# Patient Record
Sex: Female | Born: 1955 | Race: White | Marital: Married | Smoking: Never smoker
Health system: Southern US, Community
[De-identification: ages and names within clinical notes are randomized; demographics above are authoritative.]

## PROBLEM LIST (undated history)

## (undated) DIAGNOSIS — F32A Depression, unspecified: Secondary | ICD-10-CM

## (undated) DIAGNOSIS — M5136 Other intervertebral disc degeneration, lumbar region: Secondary | ICD-10-CM

## (undated) DIAGNOSIS — E119 Type 2 diabetes mellitus without complications: Secondary | ICD-10-CM

## (undated) DIAGNOSIS — M51369 Other intervertebral disc degeneration, lumbar region without mention of lumbar back pain or lower extremity pain: Secondary | ICD-10-CM

## (undated) DIAGNOSIS — K8020 Calculus of gallbladder without cholecystitis without obstruction: Secondary | ICD-10-CM

## (undated) DIAGNOSIS — I10 Essential (primary) hypertension: Secondary | ICD-10-CM

## (undated) DIAGNOSIS — I639 Cerebral infarction, unspecified: Secondary | ICD-10-CM

## (undated) DIAGNOSIS — F419 Anxiety disorder, unspecified: Secondary | ICD-10-CM

---

## 2003-10-24 ENCOUNTER — Ambulatory Visit: Payer: Self-pay

## 2016-08-27 ENCOUNTER — Emergency Department: Payer: BLUE CROSS/BLUE SHIELD

## 2016-08-27 ENCOUNTER — Emergency Department
Admission: EM | Admit: 2016-08-27 | Discharge: 2016-08-27 | Disposition: A | Discharge status: Home / Self Care | Payer: BLUE CROSS/BLUE SHIELD | Attending: Emergency Medicine | Admitting: Emergency Medicine

## 2016-08-27 ENCOUNTER — Encounter: Payer: Self-pay | Admitting: Emergency Medicine

## 2016-08-27 DIAGNOSIS — R112 Nausea with vomiting, unspecified: Secondary | ICD-10-CM | POA: Diagnosis not present

## 2016-08-27 DIAGNOSIS — N390 Urinary tract infection, site not specified: Secondary | ICD-10-CM | POA: Diagnosis not present

## 2016-08-27 DIAGNOSIS — E119 Type 2 diabetes mellitus without complications: Secondary | ICD-10-CM | POA: Insufficient documentation

## 2016-08-27 DIAGNOSIS — M5431 Sciatica, right side: Secondary | ICD-10-CM | POA: Insufficient documentation

## 2016-08-27 DIAGNOSIS — R110 Nausea: Secondary | ICD-10-CM | POA: Diagnosis present

## 2016-08-27 DIAGNOSIS — R630 Anorexia: Secondary | ICD-10-CM | POA: Insufficient documentation

## 2016-08-27 DIAGNOSIS — M5432 Sciatica, left side: Secondary | ICD-10-CM | POA: Diagnosis not present

## 2016-08-27 HISTORY — DX: Type 2 diabetes mellitus without complications: E11.9

## 2016-08-27 LAB — URINALYSIS, COMPLETE (UACMP) WITH MICROSCOPIC
BILIRUBIN URINE: NEGATIVE
Bacteria, UA: NONE SEEN
Glucose, UA: NEGATIVE mg/dL
Hgb urine dipstick: NEGATIVE
KETONES UR: 20 mg/dL — AB
NITRITE: NEGATIVE
PROTEIN: NEGATIVE mg/dL
Specific Gravity, Urine: 1.009 (ref 1.005–1.030)
pH: 6 (ref 5.0–8.0)

## 2016-08-27 LAB — COMPREHENSIVE METABOLIC PANEL
ALBUMIN: 4.5 g/dL (ref 3.5–5.0)
ALT: 10 U/L — ABNORMAL LOW (ref 14–54)
ANION GAP: 14 (ref 5–15)
AST: 13 U/L — ABNORMAL LOW (ref 15–41)
Alkaline Phosphatase: 50 U/L (ref 38–126)
BILIRUBIN TOTAL: 1.2 mg/dL (ref 0.3–1.2)
BUN: 13 mg/dL (ref 6–20)
CALCIUM: 10.6 mg/dL — AB (ref 8.9–10.3)
CO2: 24 mmol/L (ref 22–32)
Chloride: 98 mmol/L — ABNORMAL LOW (ref 101–111)
Creatinine, Ser: 0.54 mg/dL (ref 0.44–1.00)
GFR calc Af Amer: >60 mL/min (ref >60)
GLUCOSE: 165 mg/dL — AB (ref 65–99)
Potassium: 3.5 mmol/L (ref 3.5–5.1)
Sodium: 136 mmol/L (ref 135–145)
TOTAL PROTEIN: 7.3 g/dL (ref 6.5–8.1)

## 2016-08-27 LAB — LIPASE, BLOOD: Lipase: 29 U/L (ref 11–51)

## 2016-08-27 LAB — CBC
HCT: 41.5 % (ref 35.0–47.0)
HEMOGLOBIN: 14.3 g/dL (ref 12.0–16.0)
MCH: 31.2 pg (ref 26.0–34.0)
MCHC: 34.3 g/dL (ref 32.0–36.0)
MCV: 90.9 fL (ref 80.0–100.0)
Platelets: 374 10*3/uL (ref 150–440)
RBC: 4.57 MIL/uL (ref 3.80–5.20)
RDW: 15 % — AB (ref 11.5–14.5)
WBC: 10.3 10*3/uL (ref 3.6–11.0)

## 2016-08-27 LAB — GLUCOSE, CAPILLARY: GLUCOSE-CAPILLARY: 131 mg/dL — AB (ref 65–99)

## 2016-08-27 MED ORDER — IOPAMIDOL (ISOVUE-300) INJECTION 61%
15.0000 mL | INTRAVENOUS | Status: AC
  Administered 2016-08-27: 15 mL via ORAL

## 2016-08-27 MED ORDER — IOPAMIDOL (ISOVUE-300) INJECTION 61%
75.0000 mL | Freq: Once | INTRAVENOUS | Status: AC | PRN
  Administered 2016-08-27: 75 mL via INTRAVENOUS

## 2016-08-27 MED ORDER — CEPHALEXIN 500 MG PO CAPS: 500.0000 mg | ORAL_CAPSULE | Freq: Three times a day (TID) | ORAL | 0 refills | Status: AC

## 2016-08-27 MED ORDER — SODIUM CHLORIDE 0.9 % IV BOLUS (SEPSIS)
1000.0000 mL | Freq: Once | INTRAVENOUS | Status: AC
  Administered 2016-08-27: 1000 mL via INTRAVENOUS

## 2016-08-27 MED ORDER — ONDANSETRON HCL 4 MG PO TABS: 4.0000 mg | ORAL_TABLET | Freq: Every day | ORAL | 0 refills | Status: DC | PRN

## 2016-08-27 NOTE — ED Provider Notes (Signed)
[facility] Emergency Department Provider Note  ____________________________________________   I have reviewed the triage vital signs and the nursing notes.   HISTORY  Chief Complaint Nausea    HPI Lacey Gonzalez is a 61 y.o. female Who presents with multiple complaints. Mostly, she complains of nausea. Patient states she's been nauseated for for 5 months. She is losing weight. She does not have any clear reason. Her endocrinologist is no help. She is signed to go to a primary care doctor for this but she has not yet done so. She denies any fevers or chills. She states she vomits every today, nearlyand has been for months. She denies any hematemesis bright red blood per rectum or constipation. She has normal bowel movements. She denies abdominal pain. She has chronic bilateral lower extremity pain for which she takes gabapentin is poorly controlled. He has been there for months. She states thatshe has no headaches or focal numbness or weakness although she states her right leg hurts when she walks and she does not like to move it. She walks with a cane for this reason and has for the last 3 or 4 months. There are no acute problems today however, she happened to be in the hospital because someone in her family was here for another reason, she thought she would check in.   Past Medical History:  Diagnosis Date  . Diabetes mellitus without complication (HCC)     There are no active problems to display for this patient.   History reviewed. No pertinent surgical history.  Prior to Admission medications   Not on File    Allergies Patient has no known allergies.  History reviewed. No pertinent family history.  Social History Social History  Substance Use Topics  . Smoking status: Never Smoker  . Smokeless tobacco: Never Used  . Alcohol use Yes    Review of Systems Constitutional: No fever/chills Eyes: No visual changes. ENT: No sore throat. No stiff  neck no neck pain Cardiovascular: Denies chest pain. Respiratory: Denies shortness of breath. Gastrointestinal:   Positive reportedvomiting.  No diarrhea.  No constipation. Genitourinary: Negative for dysuria. Musculoskeletal: Negative lower extremity swelling Skin: Negative for rash. Neurological: Negative for severe headaches, focal weakness or numbness.   ____________________________________________   PHYSICAL EXAM:  VITAL SIGNS: ED Triage Vitals  Enc Vitals Group     BP 08/27/16 1453 (!) 162/67     Pulse Rate 08/27/16 1453 98     Resp 08/27/16 1453 18     Temp 08/27/16 1453 98 F (36.7 C)     Temp Source 08/27/16 1453 Oral     SpO2 08/27/16 1453 100 %     Weight 08/27/16 1454 113 lb (51.3 kg)     Height 08/27/16 1454 5\' 3"  (1.6 m)     Head Circumference --      Peak Flow --      Pain Score --      Pain Loc --      Pain Edu? --      Excl. in GC? --     Constitutional: Alert and oriented. Well appearing and in no acute distress. Eyes: Conjunctivae are normal Head: Atraumatic HEENT: No congestion/rhinnorhea. Mucous membranes are moist.  Oropharynx non-erythematous Neck:   Nontender with no meningismus, no masses, no stridor Cardiovascular: Normal rate, regular rhythm. Grossly normal heart sounds.  Good peripheral circulation. Respiratory: Normal respiratory effort.  No retractions. Lungs CTAB. Abdominal: Soft and nontender. No distention. No guarding no  rebound Back:  There is no focal tenderness or step off.  there is no midline tenderness there are no lesions noted. there is no CVA tenderness Musculoskeletal: No lower extremity tenderness, no upper extremity tenderness. No joint effusions, no DVT signs strong distal pulses no edema Neurologic:  Normal speech and language. Patient has decreased strength in her right leg but she states it because it hurts to lift, she has sciatic-like pain that shoots in her legs she states. Her reflexes are symmetric and she has strong  pulses.no saddle anesthesia, otherwise normal neurologic exam Skin:  Skin is warm, dry and intact. No rash noted. Psychiatric: Mood and affect are normal. Speech and behavior are normal.  ____________________________________________   LABS (all labs ordered are listed, but only abnormal results are displayed)  Labs Reviewed  COMPREHENSIVE METABOLIC PANEL - Abnormal; Notable for the following:       Result Value   Chloride 98 (*)    Glucose, Bld 165 (*)    Calcium 10.6 (*)    AST 13 (*)    ALT 10 (*)    All other components within normal limits  CBC - Abnormal; Notable for the following:    RDW 15.0 (*)    All other components within normal limits  URINALYSIS, COMPLETE (UACMP) WITH MICROSCOPIC - Abnormal; Notable for the following:    Color, Urine YELLOW (*)    APPearance HAZY (*)    Ketones, ur 20 (*)    Leukocytes, UA LARGE (*)    Squamous Epithelial / LPF 0-5 (*)    All other components within normal limits  GLUCOSE, CAPILLARY - Abnormal; Notable for the following:    Glucose-Capillary 131 (*)    All other components within normal limits  URINE CULTURE  LIPASE, BLOOD   ____________________________________________  EKG  I personally interpreted any EKGs ordered by me or triage  ____________________________________________  RADIOLOGY  I reviewed any imaging ordered by me or triage that were performed during my shift and, if possible, patient and/or family made aware of any abnormal findings. ____________________________________________   PROCEDURES  Procedure(s) performed: None  Procedures  Critical Care performed: None  ____________________________________________   INITIAL IMPRESSION / ASSESSMENT AND PLAN / ED COURSE  Pertinent labs & imaging results that were available during my care of the patient were reviewed by me and considered in my medical decision making (see chart for details).  Patient here for nausea or for 5 months, obviously not an  emergent condition. The differential for nausea for for 5 months is very broad. She states shehas suffered some  Weight loss. I did ask her she is depressed and she states she might be but she doesn't want counseling she has no suicidal or homicidal thoughts. She declines inpatient psychiatric care at this time.she states she sleeps well. Patient is requesting a work note. Given the broad differential that could possibly cause someone have nausea for 5 months, I did a CT of her abdomen and pelvis which is n aside from some possible degenerative issues in her back which could be partially responsible for her sciatic-like symptoms. I will refer her to orthopedic surgery for that. CT of the abdomen and pelvis is otherwise unremarkable for emergent pathology. She has cholelithiasis but there is no evidence of holecystitis and her liver function tests areassuring, she has no tenderness to palpation in that area. She may require outpatient follow-up and she states she does have an appointment to see her primary care doctor and I have  advised her to mention this issue. Patient does have a urinary tract infection, is unclear how long she has had a period of sending a urine culture was started her on antibiotics. I don't think that there is any evidence of ACS. Patient has no chest pain issues of breath, chest x-ray is clear, she has no exertional symptoms, she has nausea with food. She may have gastroparesis. She clearly needs outpatient follow-up for multiple different sources. I can refer her to most of them and I think mostly she needs to follow closely with primary which she states she will do. Extensive return precautions and follow-up given and understood.    ____________________________________________   FINAL CLINICAL IMPRESSION(S) / ED DIAGNOSES  Final diagnoses:  None      This chart was dictated using voice recognition software.  Despite best efforts to proofread,  errors can occur which can change  meaning.      Jeanmarie Plant, MD 08/27/16 [phone]

## 2016-08-27 NOTE — Discharge Instructions (Signed)
Patient to follow closely with primary care. We do noticed that you have gallstones, and some degenerative changes in your back. Please follow with orthopedic surgery, as well as with the gastroenterologist. Return to the emergency room for numbness, weakness, increased pain, vomiting that is persistent or worsen usual, any bleeding or any other new or worrisome symptoms. Follow closely with all of the above doctors.

## 2016-08-27 NOTE — ED Triage Notes (Signed)
Pt to ed with c/o nausea and decreased appetite since April 2018.

## 2016-08-29 LAB — URINE CULTURE: Culture: NO GROWTH

## 2017-01-08 ENCOUNTER — Other Ambulatory Visit: Payer: Self-pay

## 2017-01-08 ENCOUNTER — Encounter: Payer: Self-pay | Admitting: *Deleted

## 2017-01-08 ENCOUNTER — Emergency Department: Payer: BLUE CROSS/BLUE SHIELD

## 2017-01-08 ENCOUNTER — Emergency Department
Admission: EM | Admit: 2017-01-08 | Discharge: 2017-01-09 | Disposition: A | Discharge status: Home / Self Care | Payer: BLUE CROSS/BLUE SHIELD | Attending: Emergency Medicine | Admitting: Emergency Medicine

## 2017-01-08 DIAGNOSIS — Y999 Unspecified external cause status: Secondary | ICD-10-CM | POA: Diagnosis not present

## 2017-01-08 DIAGNOSIS — Y9389 Activity, other specified: Secondary | ICD-10-CM | POA: Diagnosis not present

## 2017-01-08 DIAGNOSIS — W19XXXA Unspecified fall, initial encounter: Secondary | ICD-10-CM | POA: Insufficient documentation

## 2017-01-08 DIAGNOSIS — M6281 Muscle weakness (generalized): Secondary | ICD-10-CM | POA: Diagnosis present

## 2017-01-08 DIAGNOSIS — S7002XA Contusion of left hip, initial encounter: Secondary | ICD-10-CM | POA: Diagnosis not present

## 2017-01-08 DIAGNOSIS — E119 Type 2 diabetes mellitus without complications: Secondary | ICD-10-CM | POA: Diagnosis not present

## 2017-01-08 DIAGNOSIS — Y9289 Other specified places as the place of occurrence of the external cause: Secondary | ICD-10-CM | POA: Diagnosis not present

## 2017-01-08 DIAGNOSIS — R110 Nausea: Secondary | ICD-10-CM | POA: Insufficient documentation

## 2017-01-08 DIAGNOSIS — E860 Dehydration: Secondary | ICD-10-CM | POA: Diagnosis not present

## 2017-01-08 HISTORY — DX: Other intervertebral disc degeneration, lumbar region without mention of lumbar back pain or lower extremity pain: M51.369

## 2017-01-08 HISTORY — DX: Calculus of gallbladder without cholecystitis without obstruction: K80.20

## 2017-01-08 HISTORY — DX: Other intervertebral disc degeneration, lumbar region: M51.36

## 2017-01-08 LAB — COMPREHENSIVE METABOLIC PANEL
ALBUMIN: 4.4 g/dL (ref 3.5–5.0)
ALT: 15 U/L (ref 14–54)
AST: 19 U/L (ref 15–41)
Alkaline Phosphatase: 70 U/L (ref 38–126)
Anion gap: 17 — ABNORMAL HIGH (ref 5–15)
BUN: 16 mg/dL (ref 6–20)
CHLORIDE: 100 mmol/L — AB (ref 101–111)
CO2: 16 mmol/L — ABNORMAL LOW (ref 22–32)
Calcium: 10.4 mg/dL — ABNORMAL HIGH (ref 8.9–10.3)
Creatinine, Ser: 0.6 mg/dL (ref 0.44–1.00)
GFR calc Af Amer: >60 mL/min (ref >60)
GFR calc non Af Amer: >60 mL/min (ref >60)
GLUCOSE: 149 mg/dL — AB (ref 65–99)
POTASSIUM: 4.3 mmol/L (ref 3.5–5.1)
Sodium: 133 mmol/L — ABNORMAL LOW (ref 135–145)
Total Bilirubin: 1.5 mg/dL — ABNORMAL HIGH (ref 0.3–1.2)
Total Protein: 7.1 g/dL (ref 6.5–8.1)

## 2017-01-08 LAB — URINALYSIS, COMPLETE (UACMP) WITH MICROSCOPIC
BILIRUBIN URINE: NEGATIVE
Bacteria, UA: NONE SEEN
Glucose, UA: NEGATIVE mg/dL
HGB URINE DIPSTICK: NEGATIVE
KETONES UR: 80 mg/dL — AB
LEUKOCYTES UA: NEGATIVE
Nitrite: NEGATIVE
PH: 5 (ref 5.0–8.0)
Protein, ur: NEGATIVE mg/dL
Specific Gravity, Urine: 1.016 (ref 1.005–1.030)

## 2017-01-08 LAB — BASIC METABOLIC PANEL
ANION GAP: 9 (ref 5–15)
BUN: 10 mg/dL (ref 6–20)
CO2: 16 mmol/L — ABNORMAL LOW (ref 22–32)
Calcium: 7.5 mg/dL — ABNORMAL LOW (ref 8.9–10.3)
Chloride: 111 mmol/L (ref 101–111)
Creatinine, Ser: 0.41 mg/dL — ABNORMAL LOW (ref 0.44–1.00)
GLUCOSE: 129 mg/dL — AB (ref 65–99)
POTASSIUM: 4.4 mmol/L (ref 3.5–5.1)
Sodium: 136 mmol/L (ref 135–145)

## 2017-01-08 LAB — CBC WITH DIFFERENTIAL/PLATELET
BASOS ABS: 0 10*3/uL (ref 0–0.1)
BASOS PCT: 1 %
EOS ABS: 0 10*3/uL (ref 0–0.7)
EOS PCT: 0 %
HCT: 42.3 % (ref 35.0–47.0)
Hemoglobin: 14.2 g/dL (ref 12.0–16.0)
Lymphocytes Relative: 38 %
Lymphs Abs: 2.6 10*3/uL (ref 1.0–3.6)
MCH: 32.1 pg (ref 26.0–34.0)
MCHC: 33.5 g/dL (ref 32.0–36.0)
MCV: 95.9 fL (ref 80.0–100.0)
MONO ABS: 0.2 10*3/uL (ref 0.2–0.9)
Monocytes Relative: 4 %
NEUTROS ABS: 4 10*3/uL (ref 1.4–6.5)
Neutrophils Relative %: 57 %
PLATELETS: 412 10*3/uL (ref 150–440)
RBC: 4.41 MIL/uL (ref 3.80–5.20)
RDW: 13 % (ref 11.5–14.5)
WBC: 6.8 10*3/uL (ref 3.6–11.0)

## 2017-01-08 LAB — BLOOD GAS, VENOUS
PCO2 VEN: 39 mmHg — AB (ref 44.0–60.0)
Patient temperature: 37
pH, Ven: 7.29 (ref 7.250–7.430)
pO2, Ven: 36 mmHg (ref 32.0–45.0)

## 2017-01-08 MED ORDER — METOCLOPRAMIDE HCL 10 MG PO TABS: 10.0000 mg | ORAL_TABLET | Freq: Three times a day (TID) | ORAL | 0 refills | Status: AC | PRN

## 2017-01-08 MED ORDER — TRAMADOL HCL 50 MG PO TABS: 50.0000 mg | ORAL_TABLET | Freq: Four times a day (QID) | ORAL | 0 refills | Status: AC | PRN

## 2017-01-08 MED ORDER — KETOROLAC TROMETHAMINE 30 MG/ML IJ SOLN
15.0000 mg | Freq: Once | INTRAMUSCULAR | Status: AC
Start: 2017-01-08 — End: 2017-01-08
  Administered 2017-01-08: 15 mg via INTRAVENOUS
  Filled 2017-01-08: qty 1

## 2017-01-08 MED ORDER — SODIUM CHLORIDE 0.9 % IV BOLUS (SEPSIS)
1000.0000 mL | Freq: Once | INTRAVENOUS | Status: AC
  Administered 2017-01-08: 1000 mL via INTRAVENOUS

## 2017-01-08 MED ORDER — KETOROLAC TROMETHAMINE 30 MG/ML IJ SOLN
30.0000 mg | Freq: Once | INTRAMUSCULAR | Status: AC
  Administered 2017-01-08: 30 mg via INTRAVENOUS
  Filled 2017-01-08: qty 1

## 2017-01-08 MED ORDER — METOCLOPRAMIDE HCL 5 MG/ML IJ SOLN
10.0000 mg | Freq: Once | INTRAMUSCULAR | Status: AC
  Administered 2017-01-08: 10 mg via INTRAVENOUS
  Filled 2017-01-08: qty 2

## 2017-01-08 NOTE — ED Notes (Signed)
Fluids placed on IV pump.

## 2017-01-08 NOTE — ED Notes (Signed)
Patient transported to X-ray 

## 2017-01-08 NOTE — ED Provider Notes (Signed)
[facility] Emergency Department Provider Note ____________________________________________   First MD Initiated Contact with Patient 01/08/17 1616     (approximate)  I have reviewed the triage vital signs and the nursing notes.   HISTORY  Chief Complaint Leg Pain    HPI Lacey MichaelisCheri H Berkemeier is a 62 y.o. female with past medical history as noted below including diabetes (noncompliant with medications) and chronic pain who presents primarily with increased generalized weakness, increased nausea, and inability to tolerate anything by mouth over the last several days.  She reports chronic left leg pain and both legs giving out on her for approximately last 7-8 months, as well as chronic nausea for at least 6 months.  However she states that the reason she came in today is because the nausea was worse and she felt weak.    Patient reports falling when her legs gave out 1 week ago and having increased left hip and left knee pain since that time.  She states she did have an x-ray of her left knee at urgent care after the fall which was normal.  But the patient states she was unable to afford her diabetes medication and so has not been on it for several months.  She has not seen a primary care doctor or other specialist in several months as well.  Past Medical History:  Diagnosis Date  . DDD (degenerative disc disease), lumbar   . Diabetes mellitus without complication (HCC)   . Gallstones     There are no active problems to display for this patient.   History reviewed. No pertinent surgical history.  Prior to Admission medications   Medication Sig Start Date End Date Taking? Authorizing Provider  ondansetron (ZOFRAN) 4 MG tablet Take 1 tablet (4 mg total) by mouth daily as needed for nausea or vomiting. 08/27/16   Jeanmarie PlantMcShane, James A, MD    Allergies Patient has no known allergies.  No family history on file.  Social History Social History   Tobacco Use  .  Smoking status: Never Smoker  . Smokeless tobacco: Never Used  Substance Use Topics  . Alcohol use: Yes    Comment: occasional  . Drug use: No    Review of Systems  Constitutional: No fever. Eyes: No visual changes. ENT: No sore throat. Cardiovascular: Denies chest pain. Respiratory: Denies shortness of breath. Gastrointestinal: Positive for nausea, no vomiting.  No diarrhea..  Genitourinary: Positive for urinary frequency..  Musculoskeletal: Negative for back pain.  Positive for left leg pain. Skin: Negative for rash. Neurological: Negative for headache.   ____________________________________________   PHYSICAL EXAM:  VITAL SIGNS: ED Triage Vitals  Enc Vitals Group     BP 01/08/17 1433 (!) 155/60     Pulse Rate 01/08/17 1433 (!) 106     Resp 01/08/17 1433 18     Temp 01/08/17 1433 98 F (36.7 C)     Temp Source 01/08/17 1433 Oral     SpO2 01/08/17 1433 100 %     Weight 01/08/17 1433 108 lb (49 kg)     Height 01/08/17 1433 5\' 2"  (1.575 m)     Head Circumference --      Peak Flow --      Pain Score 01/08/17 1435 8     Pain Loc --      Pain Edu? --      Excl. in GC? --     Constitutional: Alert and oriented.  Week appearing but in no acute distress.  Eyes: Conjunctivae are normal.  Head: Atraumatic. Nose: No congestion/rhinnorhea. Mouth/Throat: Mucous membranes are dry.   Neck: Normal range of motion.  Cardiovascular: Normal rate, regular rhythm. Grossly normal heart sounds.  Good peripheral circulation. Respiratory: Normal respiratory effort.  No retractions. Lungs CTAB. Gastrointestinal: Soft and nontender. No distention.  Genitourinary: No CVA tenderness. Musculoskeletal: No lower extremity edema.  Extremities warm and well perfused.  Full range of motion to left hip and knee, but with pain on range of motion of both.  Mild tenderness to lateral left hip.  No deformity to hip or knee.  Extensor mechanism intact.  2+ DP pulse to left lower  extremity. Neurologic:  Normal speech and language. No gross focal neurologic deficits are appreciated.  5 out of 5 motor strength and normal sensation to all extremities. Skin:  Skin is warm and dry. No rash noted. Psychiatric: Mood and affect are normal. Speech and behavior are normal.  ____________________________________________   LABS (all labs ordered are listed, but only abnormal results are displayed)  Labs Reviewed  COMPREHENSIVE METABOLIC PANEL - Abnormal; Notable for the following components:      Result Value   Sodium 133 (*)    Chloride 100 (*)    CO2 16 (*)    Glucose, Bld 149 (*)    Calcium 10.4 (*)    Total Bilirubin 1.5 (*)    Anion gap 17 (*)    All other components within normal limits  URINALYSIS, COMPLETE (UACMP) WITH MICROSCOPIC - Abnormal; Notable for the following components:   Color, Urine YELLOW (*)    APPearance CLEAR (*)    Ketones, ur 80 (*)    Squamous Epithelial / LPF 0-5 (*)    All other components within normal limits  BLOOD GAS, VENOUS - Abnormal; Notable for the following components:   pCO2, Ven 39 (*)    All other components within normal limits  BASIC METABOLIC PANEL - Abnormal; Notable for the following components:   CO2 16 (*)    Glucose, Bld 129 (*)    Creatinine, Ser 0.41 (*)    Calcium 7.5 (*)    All other components within normal limits  CBC WITH DIFFERENTIAL/PLATELET   ____________________________________________  EKG  ED ECG REPORT I, Dionne Bucy, the attending physician, personally viewed and interpreted this ECG.  Date: 01/08/2017 EKG Time: 1652 Rate: 86 Rhythm: normal sinus rhythm QRS Axis: normal Intervals: normal ST/T Wave abnormalities: Nonspecific T wave abnormalities laterally Narrative Interpretation: no evidence of acute ischemia; no prior EKG available for  comparison  ____________________________________________  RADIOLOGY    ____________________________________________   PROCEDURES  Procedure(s) performed: No    Critical Care performed: No ____________________________________________   INITIAL IMPRESSION / ASSESSMENT AND PLAN / ED COURSE  Pertinent labs & imaging results that were available during my care of the patient were reviewed by me and considered in my medical decision making (see chart for details).  62 year old female with past medical history as noted above and chronically noncompliant with her diabetes medication presents with chronic feeling of weakness in her legs with them "giving out" recurrently, as well as chronic nausea, but reports increasing nausea, decreased appetite and p.o. intake, and increased generalized weakness over the last few days.  Also with a recent fall causing worsened left hip pain.  On review of past medical records in Epic, the patient was seen in the ED in August of last year reporting the chronic nausea and had a CT which had no acute intra-abdominal findings.  She was referred for outpatient care but the patient states she did not follow-up.  On exam, the patient is somewhat weak but otherwise relatively comfortable appearing, and vital signs are normal.  The remainder the exam is unremarkable except for pain on range of motion of the left knee and hip.  Differential includes dehydration, hypoglycemia, other electrolyte or metabolic derangement, or less likely infection.  In terms of the left hip pain I suspect most likely contusion exacerbating patient's chronic pain and sciatica, however given that the patient never had imaging of the left hip will obtain an x-ray.  ----------------------------------------- 4:49 PM on 01/08/2017 -----------------------------------------  Initial labs reveal low bicarb, elevated anion gap, and ketones in the urine, however without significant  hyperglycemia.  I suspect that the patient could be in some element of DKA without hyperglycemia, versus other cause of metabolic acidosis including dehydration.  I will obtain a VBG, and give 2 L of NS then recheck the BMP.  If persistent acidosis I will consider insulin and dextrose drips. ----------------------------------------- 8:54 PM on 01/08/2017 -----------------------------------------  Patient has received 2 L of normal saline.  VBG in the interim did not show significant acidosis.  I will repeat the BMP.  Pain is improved.  ----------------------------------------- 11:33 PM on 01/08/2017 -----------------------------------------  Patient's repeat BMP is significantly improved.  She still has a low bicarbonate, however there is no anion gap.  VBG is normal.  Given that this is consistent with mild non-anion gap metabolic acidosis, the most likely etiology is GI losses given the patient's poor p.o. Intake.  There is no clinical evidence for DKA or other acute Patient states that she feels much better and she would like to go home.  She is currently eating and drinking and tolerating it without difficulty.  I will prescribe the patient with nausea medicine as well as the pain medicine for the acute pain in her left hip.  She has follow-up with an orthopedist later this week.  I also gave her a primary care referral.  I explained the discharge instructions and return precautions to the patient and she expresses understanding. ____________________________________________   FINAL CLINICAL IMPRESSION(S) / ED DIAGNOSES  Final diagnoses:  Dehydration  Chronic nausea  Contusion of left hip, initial encounter      NEW MEDICATIONS STARTED DURING THIS VISIT:  This SmartLink is deprecated. Use AVSMEDLIST instead to display the medication list for a patient.   Note:  This document was prepared using Dragon voice recognition software and may include unintentional dictation errors.     Dionne Bucy, MD 01/08/17 2336

## 2017-01-08 NOTE — ED Notes (Addendum)
Pt reports that she is having bilat leg pain and difficulty bearing weight on them - this has been present for 7-8 months - pt states this is now affecting her work and her ADL's - pt states that she decided to come to ER today because she was weak - pt reports she has not eaten in two days d/t nausea

## 2017-01-08 NOTE — ED Notes (Signed)
RT notified that VBG was sent to lab

## 2017-01-08 NOTE — ED Triage Notes (Addendum)
Patient c/o left hip pain and leg pain for several months. Patient states her legs just give out. Patient states she has had poor PO intake for the last few days and hasn't taken her diabetes meds for a "couple of months."

## 2017-01-08 NOTE — Discharge Instructions (Signed)
You may take the nausea and pain medications as needed.  Return to the emergency department for new or worsening nausea or vomiting, inability to tolerate anything by mouth, worsening weakness or lightheadedness, abdominal pain, fevers, worsening pain or difficulty walking, or any other new or worsening symptoms that concern you.  You should follow-up with her primary care doctor within the next several weeks, and a referral has been provided.  You should also follow-up with the orthopedist this week as scheduled.

## 2017-04-13 DIAGNOSIS — E119 Type 2 diabetes mellitus without complications: Secondary | ICD-10-CM

## 2019-02-26 ENCOUNTER — Ambulatory Visit: Payer: BLUE CROSS/BLUE SHIELD | Attending: Internal Medicine

## 2019-02-26 DIAGNOSIS — Z20822 Contact with and (suspected) exposure to covid-19: Secondary | ICD-10-CM

## 2019-02-27 LAB — NOVEL CORONAVIRUS, NAA: SARS-CoV-2, NAA: NOT DETECTED

## 2019-03-04 ENCOUNTER — Telehealth: Payer: Self-pay

## 2019-03-04 NOTE — Telephone Encounter (Signed)
Patient informed of negative covid result.  °

## 2020-07-03 DIAGNOSIS — I10 Essential (primary) hypertension: Secondary | ICD-10-CM | POA: Diagnosis present

## 2020-07-03 DIAGNOSIS — F419 Anxiety disorder, unspecified: Secondary | ICD-10-CM | POA: Insufficient documentation

## 2020-07-03 DIAGNOSIS — F32A Depression, unspecified: Secondary | ICD-10-CM | POA: Insufficient documentation

## 2020-09-20 ENCOUNTER — Inpatient Hospital Stay: Payer: BLUE CROSS/BLUE SHIELD

## 2020-09-20 ENCOUNTER — Emergency Department: Payer: BLUE CROSS/BLUE SHIELD

## 2020-09-20 ENCOUNTER — Inpatient Hospital Stay
Admission: EM | Admit: 2020-09-20 | Discharge: 2020-10-01 | DRG: 065 | Disposition: A | Discharge status: Home Health | Payer: BLUE CROSS/BLUE SHIELD | Attending: Internal Medicine | Admitting: Internal Medicine

## 2020-09-20 ENCOUNTER — Other Ambulatory Visit: Payer: Self-pay

## 2020-09-20 DIAGNOSIS — B9620 Unspecified Escherichia coli [E. coli] as the cause of diseases classified elsewhere: Secondary | ICD-10-CM | POA: Diagnosis present

## 2020-09-20 DIAGNOSIS — I444 Left anterior fascicular block: Secondary | ICD-10-CM | POA: Diagnosis present

## 2020-09-20 DIAGNOSIS — Z7984 Long term (current) use of oral hypoglycemic drugs: Secondary | ICD-10-CM

## 2020-09-20 DIAGNOSIS — M5136 Other intervertebral disc degeneration, lumbar region: Secondary | ICD-10-CM | POA: Diagnosis present

## 2020-09-20 DIAGNOSIS — R531 Weakness: Secondary | ICD-10-CM

## 2020-09-20 DIAGNOSIS — E1165 Type 2 diabetes mellitus with hyperglycemia: Secondary | ICD-10-CM | POA: Diagnosis present

## 2020-09-20 DIAGNOSIS — R29705 NIHSS score 5: Secondary | ICD-10-CM | POA: Diagnosis not present

## 2020-09-20 DIAGNOSIS — Z79899 Other long term (current) drug therapy: Secondary | ICD-10-CM

## 2020-09-20 DIAGNOSIS — F32A Depression, unspecified: Secondary | ICD-10-CM | POA: Diagnosis present

## 2020-09-20 DIAGNOSIS — E663 Overweight: Secondary | ICD-10-CM | POA: Diagnosis present

## 2020-09-20 DIAGNOSIS — R270 Ataxia, unspecified: Secondary | ICD-10-CM | POA: Diagnosis present

## 2020-09-20 DIAGNOSIS — E1143 Type 2 diabetes mellitus with diabetic autonomic (poly)neuropathy: Secondary | ICD-10-CM | POA: Diagnosis present

## 2020-09-20 DIAGNOSIS — G6289 Other specified polyneuropathies: Secondary | ICD-10-CM | POA: Diagnosis not present

## 2020-09-20 DIAGNOSIS — Z794 Long term (current) use of insulin: Secondary | ICD-10-CM

## 2020-09-20 DIAGNOSIS — I639 Cerebral infarction, unspecified: Secondary | ICD-10-CM | POA: Diagnosis not present

## 2020-09-20 DIAGNOSIS — F419 Anxiety disorder, unspecified: Secondary | ICD-10-CM | POA: Diagnosis present

## 2020-09-20 DIAGNOSIS — R29703 NIHSS score 3: Secondary | ICD-10-CM | POA: Diagnosis not present

## 2020-09-20 DIAGNOSIS — N3000 Acute cystitis without hematuria: Secondary | ICD-10-CM | POA: Diagnosis present

## 2020-09-20 DIAGNOSIS — N390 Urinary tract infection, site not specified: Secondary | ICD-10-CM | POA: Diagnosis present

## 2020-09-20 DIAGNOSIS — G8191 Hemiplegia, unspecified affecting right dominant side: Secondary | ICD-10-CM | POA: Diagnosis present

## 2020-09-20 DIAGNOSIS — E119 Type 2 diabetes mellitus without complications: Secondary | ICD-10-CM

## 2020-09-20 DIAGNOSIS — E1142 Type 2 diabetes mellitus with diabetic polyneuropathy: Secondary | ICD-10-CM | POA: Diagnosis present

## 2020-09-20 DIAGNOSIS — Z23 Encounter for immunization: Secondary | ICD-10-CM | POA: Diagnosis not present

## 2020-09-20 DIAGNOSIS — Z6827 Body mass index (BMI) 27.0-27.9, adult: Secondary | ICD-10-CM

## 2020-09-20 DIAGNOSIS — R739 Hyperglycemia, unspecified: Secondary | ICD-10-CM

## 2020-09-20 DIAGNOSIS — R42 Dizziness and giddiness: Secondary | ICD-10-CM | POA: Diagnosis present

## 2020-09-20 DIAGNOSIS — R29810 Facial weakness: Secondary | ICD-10-CM | POA: Diagnosis present

## 2020-09-20 DIAGNOSIS — R29704 NIHSS score 4: Secondary | ICD-10-CM | POA: Diagnosis not present

## 2020-09-20 DIAGNOSIS — E1169 Type 2 diabetes mellitus with other specified complication: Secondary | ICD-10-CM

## 2020-09-20 DIAGNOSIS — Z20822 Contact with and (suspected) exposure to covid-19: Secondary | ICD-10-CM | POA: Diagnosis present

## 2020-09-20 DIAGNOSIS — E440 Moderate protein-calorie malnutrition: Secondary | ICD-10-CM | POA: Diagnosis present

## 2020-09-20 DIAGNOSIS — I10 Essential (primary) hypertension: Secondary | ICD-10-CM | POA: Diagnosis present

## 2020-09-20 DIAGNOSIS — K3184 Gastroparesis: Secondary | ICD-10-CM | POA: Diagnosis present

## 2020-09-20 LAB — URINALYSIS, COMPLETE (UACMP) WITH MICROSCOPIC
Bilirubin Urine: NEGATIVE
Glucose, UA: >1000 mg/dL — AB
Hgb urine dipstick: NEGATIVE
Ketones, ur: 15 mg/dL — AB
Nitrite: POSITIVE — AB
Protein, ur: NEGATIVE mg/dL
Specific Gravity, Urine: 1.015 (ref 1.005–1.030)
pH: 5 (ref 5.0–8.0)

## 2020-09-20 LAB — CBG MONITORING, ED
Glucose-Capillary: 397 mg/dL — ABNORMAL HIGH (ref 70–99)
Glucose-Capillary: 270 mg/dL — ABNORMAL HIGH (ref 70–99)
Glucose-Capillary: 223 mg/dL — ABNORMAL HIGH (ref 70–99)

## 2020-09-20 LAB — BASIC METABOLIC PANEL
Anion gap: 9 (ref 5–15)
BUN: 21 mg/dL (ref 8–23)
CO2: 24 mmol/L (ref 22–32)
Calcium: 9.3 mg/dL (ref 8.9–10.3)
Chloride: 98 mmol/L (ref 98–111)
Creatinine, Ser: 0.73 mg/dL (ref 0.44–1.00)
GFR, Estimated: >60 mL/min (ref >60)
Glucose, Bld: 427 mg/dL — ABNORMAL HIGH (ref 70–99)
Potassium: 4.2 mmol/L (ref 3.5–5.1)
Sodium: 131 mmol/L — ABNORMAL LOW (ref 135–145)

## 2020-09-20 LAB — CBC
HCT: 42.4 % (ref 36.0–46.0)
Hemoglobin: 14.8 g/dL (ref 12.0–15.0)
MCH: 30 pg (ref 26.0–34.0)
MCHC: 34.9 g/dL (ref 30.0–36.0)
MCV: 86 fL (ref 80.0–100.0)
Platelets: 296 10*3/uL (ref 150–400)
RBC: 4.93 MIL/uL (ref 3.87–5.11)
RDW: 11.8 % (ref 11.5–15.5)
WBC: 8.5 10*3/uL (ref 4.0–10.5)
nRBC: 0 % (ref 0.0–0.2)

## 2020-09-20 LAB — HEPATIC FUNCTION PANEL
ALT: 23 U/L (ref 0–44)
AST: 20 U/L (ref 15–41)
Albumin: 3.8 g/dL (ref 3.5–5.0)
Alkaline Phosphatase: 116 U/L (ref 38–126)
Bilirubin, Direct: <0.1 mg/dL (ref 0.0–0.2)
Total Bilirubin: 0.6 mg/dL (ref 0.3–1.2)
Total Protein: 6.8 g/dL (ref 6.5–8.1)

## 2020-09-20 LAB — APTT: aPTT: 27 seconds (ref 24–36)

## 2020-09-20 LAB — TROPONIN I (HIGH SENSITIVITY)
Troponin I (High Sensitivity): 5 ng/L (ref <18)
Troponin I (High Sensitivity): 4 ng/L (ref <18)

## 2020-09-20 LAB — HEMOGLOBIN A1C
Hgb A1c MFr Bld: 11 % — ABNORMAL HIGH (ref 4.8–5.6)
Mean Plasma Glucose: 269 mg/dL

## 2020-09-20 LAB — PROTIME-INR
INR: 0.9 (ref 0.8–1.2)
Prothrombin Time: 12.2 seconds (ref 11.4–15.2)

## 2020-09-20 LAB — RESP PANEL BY RT-PCR (FLU A&B, COVID) ARPGX2
Influenza A by PCR: NEGATIVE
Influenza B by PCR: NEGATIVE
SARS Coronavirus 2 by RT PCR: NEGATIVE

## 2020-09-20 MED ORDER — DULOXETINE HCL 30 MG PO CPEP
30.0000 mg | ORAL_CAPSULE | Freq: Every day | ORAL | Status: DC
  Administered 2020-09-21 – 2020-10-01 (×11): 30 mg via ORAL
  Filled 2020-09-20 (×11): qty 1

## 2020-09-20 MED ORDER — ACETAMINOPHEN 650 MG RE SUPP: 650.0000 mg | RECTAL | Status: DC | PRN

## 2020-09-20 MED ORDER — INSULIN ASPART 100 UNIT/ML IJ SOLN
0.0000 [IU] | INTRAMUSCULAR | Status: DC
  Administered 2020-09-20: 5 [IU] via SUBCUTANEOUS
  Administered 2020-09-21 (×3): 3 [IU] via SUBCUTANEOUS
  Administered 2020-09-21: 11 [IU] via SUBCUTANEOUS
  Administered 2020-09-21: 3 [IU] via SUBCUTANEOUS
  Administered 2020-09-21: 06:00:00 2 [IU] via SUBCUTANEOUS
  Administered 2020-09-22: 8 [IU] via SUBCUTANEOUS
  Administered 2020-09-22: 05:00:00 2 [IU] via SUBCUTANEOUS
  Administered 2020-09-22: 5 [IU] via SUBCUTANEOUS
  Administered 2020-09-22: 16:00:00 2 [IU] via SUBCUTANEOUS
  Administered 2020-09-22 (×2): 8 [IU] via SUBCUTANEOUS
  Administered 2020-09-23: 21:00:00 5 [IU] via SUBCUTANEOUS
  Administered 2020-09-23: 05:00:00 2 [IU] via SUBCUTANEOUS
  Administered 2020-09-23 (×3): 3 [IU] via SUBCUTANEOUS
  Administered 2020-09-24: 8 [IU] via SUBCUTANEOUS
  Filled 2020-09-20 (×19): qty 1

## 2020-09-20 MED ORDER — SODIUM CHLORIDE 0.9 % IV SOLN: 1.0000 g | Freq: Once | INTRAVENOUS | Status: DC

## 2020-09-20 MED ORDER — ASPIRIN 325 MG PO TABS
325.0000 mg | ORAL_TABLET | Freq: Once | ORAL | Status: AC
  Administered 2020-09-20: 325 mg via ORAL
  Filled 2020-09-20 (×2): qty 1

## 2020-09-20 MED ORDER — SODIUM CHLORIDE 0.9 % IV SOLN: INTRAVENOUS | Status: AC

## 2020-09-20 MED ORDER — STROKE: EARLY STAGES OF RECOVERY BOOK: Freq: Once | Status: AC

## 2020-09-20 MED ORDER — ACETAMINOPHEN 160 MG/5ML PO SOLN
650.0000 mg | ORAL | Status: DC | PRN
  Filled 2020-09-20: qty 20.3

## 2020-09-20 MED ORDER — ACETAMINOPHEN 500 MG PO TABS: 500.0000 mg | ORAL_TABLET | Freq: Two times a day (BID) | ORAL | Status: DC

## 2020-09-20 MED ORDER — ADULT MULTIVITAMIN W/MINERALS CH
1.0000 | ORAL_TABLET | Freq: Every day | ORAL | Status: DC
  Administered 2020-09-21 – 2020-10-01 (×11): 1 via ORAL
  Filled 2020-09-20 (×11): qty 1

## 2020-09-20 MED ORDER — LACTATED RINGERS IV BOLUS: 2000.0000 mL | Freq: Once | INTRAVENOUS | Status: DC

## 2020-09-20 MED ORDER — ENOXAPARIN SODIUM 40 MG/0.4ML IJ SOSY
40.0000 mg | PREFILLED_SYRINGE | INTRAMUSCULAR | Status: DC
  Administered 2020-09-20 – 2020-09-30 (×11): 40 mg via SUBCUTANEOUS
  Filled 2020-09-20 (×11): qty 0.4

## 2020-09-20 MED ORDER — ASPIRIN EC 81 MG PO TBEC
81.0000 mg | DELAYED_RELEASE_TABLET | Freq: Every day | ORAL | Status: DC
  Administered 2020-09-21 – 2020-10-01 (×11): 81 mg via ORAL
  Filled 2020-09-20 (×11): qty 1

## 2020-09-20 MED ORDER — SODIUM CHLORIDE 0.9 % IV SOLN
1.0000 g | INTRAVENOUS | Status: DC
  Administered 2020-09-21 – 2020-09-22 (×2): 1 g via INTRAVENOUS
  Filled 2020-09-20 (×3): qty 10

## 2020-09-20 MED ORDER — ASPIRIN 325 MG PO TABS: 325.0000 mg | ORAL_TABLET | Freq: Every day | ORAL | Status: DC

## 2020-09-20 MED ORDER — METOCLOPRAMIDE HCL 10 MG PO TABS
10.0000 mg | ORAL_TABLET | Freq: Three times a day (TID) | ORAL | Status: DC | PRN
  Filled 2020-09-20: qty 1

## 2020-09-20 MED ORDER — LABETALOL HCL 5 MG/ML IV SOLN: 20.0000 mg | INTRAVENOUS | Status: DC | PRN

## 2020-09-20 MED ORDER — SODIUM CHLORIDE 0.9 % IV BOLUS
2000.0000 mL | Freq: Once | INTRAVENOUS | Status: AC
  Administered 2020-09-20: 2000 mL via INTRAVENOUS

## 2020-09-20 MED ORDER — ACETAMINOPHEN 325 MG PO TABS
650.0000 mg | ORAL_TABLET | ORAL | Status: DC | PRN
  Administered 2020-09-24: 650 mg via ORAL
  Filled 2020-09-20 (×2): qty 2

## 2020-09-20 MED ORDER — SENNOSIDES-DOCUSATE SODIUM 8.6-50 MG PO TABS: 1.0000 | ORAL_TABLET | Freq: Every evening | ORAL | Status: DC | PRN

## 2020-09-20 MED ORDER — ASPIRIN 300 MG RE SUPP: 300.0000 mg | Freq: Every day | RECTAL | Status: DC

## 2020-09-20 NOTE — ED Provider Notes (Signed)
[facility] Emergency Department Provider Note  ____________________________________________   Event Date/Time   First MD Initiated Contact with Patient 09/20/20 1551     (approximate)  I have reviewed the triage vital signs and the nursing notes.   HISTORY  Chief Complaint Dizziness   HPI Lacey RUPARD is a 65 y.o. female with past medical history of DM, gallstones, and DDD who presents for assessment of 3 to 4 days of dizziness and generalized weakness although she reports more in her right arm and bilateral legs than anywhere else over the last couple days.  She also states she has had several weeks of burning with urination and is not sure if she has been tested for UTI or not recently.  She denies any falls or loss of consciousness or hitting her head.  She denies any illicit drug use or EtOH use.  She denies any vision changes, chest pain, cough, shortness of breath, abdominal pain, vomiting or diarrhea.  She denies any rashes.  No other acute concerns at this time.  She denies any history of stroke and is not on any blood thinners.         Past Medical History:  Diagnosis Date   DDD (degenerative disc disease), lumbar    Diabetes mellitus without complication (HCC)    Gallstones     There are no problems to display for this patient.   History reviewed. No pertinent surgical history.  Prior to Admission medications   Medication Sig Start Date End Date Taking? Authorizing Provider  metoCLOPramide (REGLAN) 10 MG tablet Take 1 tablet (10 mg total) by mouth every 8 (eight) hours as needed for nausea or vomiting. 01/08/17 02/07/17  Dionne Bucy, MD  ondansetron (ZOFRAN) 4 MG tablet Take 1 tablet (4 mg total) by mouth daily as needed for nausea or vomiting. 08/27/16   Jeanmarie Plant, MD    Allergies Patient has no known allergies.  History reviewed. No pertinent family history.  Social History Social History   Tobacco Use   Smoking  status: Never   Smokeless tobacco: Never  Substance Use Topics   Alcohol use: Yes    Comment: occasional   Drug use: No    Review of Systems  Review of Systems  Constitutional:  Negative for chills and fever.  HENT:  Negative for sore throat.   Eyes:  Negative for pain.  Respiratory:  Negative for cough and stridor.   Cardiovascular:  Negative for chest pain.  Gastrointestinal:  Negative for vomiting.  Genitourinary:  Positive for dysuria.  Musculoskeletal:  Negative for myalgias.  Skin:  Negative for rash.  Neurological:  Positive for dizziness, focal weakness (R arm and b/l legs) and weakness. Negative for seizures, loss of consciousness and headaches.  Psychiatric/Behavioral:  Negative for suicidal ideas.   All other systems reviewed and are negative.    ____________________________________________   PHYSICAL EXAM:  VITAL SIGNS: ED Triage Vitals  Enc Vitals Group     BP 09/20/20 1205 (!) 172/71     Pulse Rate 09/20/20 1205 85     Resp 09/20/20 1205 19     Temp 09/20/20 1205 97.8 F (36.6 C)     Temp Source 09/20/20 1205 Oral     SpO2 09/20/20 1205 100 %     Weight 09/20/20 1206 148 lb (67.1 kg)     Height 09/20/20 1206 5\' 2"  (1.575 m)     Head Circumference --      Peak Flow --  Pain Score 09/20/20 1205 0     Pain Loc --      Pain Edu? --      Excl. in GC? --    Vitals:   09/20/20 1741 09/20/20 1756  BP:    Pulse: 73 77  Resp:    Temp:    SpO2: 98% 98%   Physical Exam Vitals and nursing note reviewed.  Constitutional:      General: She is not in acute distress.    Appearance: She is well-developed.  HENT:     Head: Normocephalic and atraumatic.     Right Ear: External ear normal.     Left Ear: External ear normal.     Nose: Nose normal.     Mouth/Throat:     Mouth: Mucous membranes are dry.  Eyes:     Conjunctiva/sclera: Conjunctivae normal.  Cardiovascular:     Rate and Rhythm: Normal rate and regular rhythm.     Heart sounds: No murmur  heard. Pulmonary:     Effort: Pulmonary effort is normal. No respiratory distress.     Breath sounds: Normal breath sounds.  Abdominal:     Palpations: Abdomen is soft.     Tenderness: There is no abdominal tenderness.  Musculoskeletal:     Cervical back: Neck supple.  Skin:    General: Skin is warm and dry.     Capillary Refill: Capillary refill takes more than 3 seconds.  Neurological:     Mental Status: She is alert and oriented to person, place, and time.  Psychiatric:        Mood and Affect: Mood normal.    Patient is weaker in her right arm than her left arm on flexion extension.  She does have some right-sided pronator drift and right-sided finger dysmetria.  No left-sided pronator drift or dysmetria.  Patient has symmetric strength in her lower extremities.  Sensation is intact to light touch in all extremities.  She has very subtle right-sided facial droop.  Remainder of cranial nerves II through XII appear grossly intact. ____________________________________________   LABS (all labs ordered are listed, but only abnormal results are displayed)  Labs Reviewed  BASIC METABOLIC PANEL - Abnormal; Notable for the following components:      Result Value   Sodium 131 (*)    Glucose, Bld 427 (*)    All other components within normal limits  URINALYSIS, COMPLETE (UACMP) WITH MICROSCOPIC - Abnormal; Notable for the following components:   APPearance CLOUDY (*)    Glucose, UA >1,000 (*)    Ketones, ur 15 (*)    Nitrite POSITIVE (*)    Leukocytes,Ua TRACE (*)    Bacteria, UA MANY (*)    All other components within normal limits  CBG MONITORING, ED - Abnormal; Notable for the following components:   Glucose-Capillary 397 (*)    All other components within normal limits  URINE CULTURE  RESP PANEL BY RT-PCR (FLU A&B, COVID) ARPGX2  CBC  PROTIME-INR  APTT  HEPATIC FUNCTION PANEL  HEMOGLOBIN A1C  LDL CHOLESTEROL, DIRECT  CBG MONITORING, ED  TROPONIN I (HIGH SENSITIVITY)   TROPONIN I (HIGH SENSITIVITY)   ____________________________________________  EKG  Sinus rhythm with a ventricular rate of 85, left anterior fascicle block and some nonspecific ST changes in aVL without other clear evidence of acute ischemia or significant arrhythmia. ____________________________________________  RADIOLOGY  ED MD interpretation: CT head shows no evidence of CVA intracranial hemorrhage or other clear acute intracranial process.  Official radiology report(s):  CT HEAD WO CONTRAST ( )  Result Date: 09/20/2020 CLINICAL DATA:  Neuro deficit, acute stroke suspected. EXAM: CT HEAD WITHOUT CONTRAST TECHNIQUE: Contiguous axial images were obtained from the base of the skull through the vertex without intravenous contrast. COMPARISON:  CT head 08/27/2016 BRAIN: BRAIN Cerebral ventricle sizes are concordant with the degree of cerebral volume loss. Patchy and confluent areas of decreased attenuation are noted throughout the deep and periventricular white matter of the cerebral hemispheres bilaterally, compatible with chronic microvascular ischemic disease. No evidence of large-territorial acute infarction. No parenchymal hemorrhage. No mass lesion. No extra-axial collection. No mass effect or midline shift. No hydrocephalus. Basilar cisterns are patent. Vascular: No hyperdense vessel. Skull: No acute fracture or focal lesion. Sinuses/Orbits: Paranasal sinuses and mastoid air cells are clear. The orbits are unremarkable. Other: None. IMPRESSION: No acute intracranial abnormality. Electronically Signed   By: Tish Frederickson M.D.   On: 09/20/2020 17:51    ____________________________________________   PROCEDURES  Procedure(s) performed (including Critical Care):  Procedures   ____________________________________________   INITIAL IMPRESSION / ASSESSMENT AND PLAN / ED COURSE      Patient presents with above-stated history exam for assessment of several symptoms including  dizziness and weakness in her legs as well as several weeks of burning with urination.  On arrival patient is hypertensive with otherwise stable vital signs on room air.  She is weak in her right arm as some right-sided pronator drift and is without facial droop.  No other clear focal deficits.  Abdomen is soft nontender throughout and she is oriented.  Lungs are clear bilaterally.  No evidence of recent trauma and she does not appear intoxicated.  Given focal weakness I am concerned about possible stroke versus other intracranial acute process.  CT head shows no evidence of CVA intracranial hemorrhage or other clear acute intracranial process.  Discussed with on-call neurologist Dr. Selina Cooley who recommended MR studies and admission for further work-up.  She placed order for ASA and additional stroke work-up orders.  BMP remarkable hyperglycemia with a glucose of 427 without other significant derangements.  CBC without leukocytosis or acute anemia.  UA concerning for acute cystitis positive for nitrites with trace leukocyte esterase and 21-50 WBCs and many bacteria noted.  There is also greater than 1000 glucose.  Urine culture sent.  Patient given a dose of Rocephin.  Hepatic function panel is unremarkable there is no evidence of hepatitis or cholestasis.  Troponin is nonelevated and given otherwise reassuring EKG I have low suspicion for ACS or arrhythmia contributing to dizziness.  Will treat patient with 2 L of IV fluids for some dehydration and plan to admit to medicine service for further evaluation and management.      ____________________________________________   FINAL CLINICAL IMPRESSION(S) / ED DIAGNOSES  Final diagnoses:  Dizziness  Weakness  Acute cystitis without hematuria  Hyperglycemia    Medications  insulin aspart (novoLOG) injection 0-15 Units (has no administration in time range)  cefTRIAXone (ROCEPHIN) 1 g in sodium chloride 0.9 % 100 mL IVPB (has no administration in  time range)  aspirin tablet 325 mg (has no administration in time range)    Followed by  aspirin EC tablet 81 mg (has no administration in time range)  sodium chloride 0.9 % bolus 2,000 mL (2,000 mLs Intravenous New Bag/Given 09/20/20 1740)     ED Discharge Orders     None        Note:  This document was prepared using Dragon voice recognition software and  may include unintentional dictation errors.    Gilles Chiquito, MD 09/20/20 [phone]

## 2020-09-20 NOTE — H&P (Addendum)
History and Physical    Lacey Gonzalez:[id number] DOB: 03-23-1955 DOA: 09/20/2020  Referring MD/NP/PA:   PCP: [facility]   Patient coming from:  The patient is coming from home.  At baseline, pt is independent for most of ADL.        Chief Complaint: Dizziness, generalized weakness, dysuria  HPI: Lacey Gonzalez is a 65 y.o. female with medical history significant of diabetes on Januvia and Lantus, gallstone, DDD, hypertension on Vasotec and recently started on Benicar, anxiety/depression Cymbalta who presented with dizziness and generalized weakness, dysuria  Per patient, chart review, patient say for the last 3 to 4 days she felt intermittent dizziness and also reports weakness started last night more in her right arm and bilateral legs.   significant others at bedside reports this morning noticed right-sided facial droop, denies vision change, paresthesia, Does report dysuria for several weeks, not get medical attention.  Otherwise patient reports no  fever, chills, chest pain, palpitation, shortness of breath, cough, abdominal pain,  nausea, vomiting, diarrhea,  leg swelling.   ED Course: pt was found to have temperature 97.8, heart rate 83, respirate 20, blood pressure 154/77-1 72/71, oxygen saturation 98% on room air.  Labs shows glucose 427, otherwise unremarkable CMP, high-sensitivity troponin 5-4, unremarkable CBC, INR 0.9, UA shows trace leukocyte esterase, glucose more than 1000, WBC 21-50 CT head: Negative MRI brain, MRA pending EKG: Normal sinus rhythm.  Nonspecific ST-T change.  In ED patient was give aspirin, Rocephin, insulin Consult neurology: Recommendation  - Permissive HTN x48 hrs from sx onset or until stroke ruled out by MRI goal  BP <220/110. PRN labetalol or hydralazine if BP above these parameters. Avoid oral antihypertensives. - MRI brain wo contrast - MRA H&N - TTE w/ bubble - Check A1c and LDL + add statin per guidelines - ASA 325mg  now f/b  81mg  daily - q4 hr neuro checks - STAT head CT for any change in neuro exam - Tele - PT/OT/SLP Hospitalist was requested to admit patient   Review of Systems:   General: no fevers, chills, no body weight gain, has poor appetite, has fatigue HEENT: no blurry vision, hearing changes or sore throat Respiratory: no dyspnea, coughing, wheezing CV: no chest pain, no palpitations GI: no nausea, vomiting, abdominal pain, diarrhea, constipation GU: no  hematuria  Ext: no leg edema Neuro:  numbness, or tingling, no vision change or hearing loss Skin: no rash, no skin tear. MSK: No muscle spasm, no deformity, no limitation of range of movement in spin Heme: No easy bruising.  Travel history: No recent long distant travel.  Allergy: No Known Allergies  Past Medical History:  Diagnosis Date   DDD (degenerative disc disease), lumbar    Diabetes mellitus without complication (HCC)    Gallstones     History reviewed. No pertinent surgical history.  Social History:  reports that she has never smoked. She has never used smokeless tobacco. She reports current alcohol use. She reports that she does not use drugs.  Family History: History reviewed. No pertinent family history.   Prior to Admission medications   Medication Sig Start Date End Date Taking? Authorizing Provider  metoCLOPramide (REGLAN) 10 MG tablet Take 1 tablet (10 mg total) by mouth every 8 (eight) hours as needed for nausea or vomiting. 01/08/17 02/07/17  03/08/17, MD  ondansetron (ZOFRAN) 4 MG tablet Take 1 tablet (4 mg total) by mouth daily as needed for nausea or vomiting. 08/27/16   McShane,  Rudy Jew, MD    Physical Exam: Vitals:   09/20/20 1700 09/20/20 1741 09/20/20 1756 09/20/20 1810  BP: (!) 154/77   (!) 164/83  Pulse:  73 77 83  Resp:    20  Temp:      TempSrc:      SpO2:  98% 98% 98%  Weight:      Height:       General: Not in acute distress HEENT:       Eyes: PERRL, EOMI, no scleral icterus.        ENT: No discharge from the ears and nose, no pharynx injection, no tonsillar enlargement.        Neck: No JVD, no bruit, no mass felt. Heme: No neck lymph node enlargement. Cardiac: S1/S2, RRR, No murmurs, No gallops or rubs. Respiratory: Good air movement bilaterally. No rales, wheezing, rhonchi or rubs. GI: Soft, nondistended, nontender, no rebound pain, no organomegaly, BS present. GU: No hematuria Ext: No pitting leg edema bilaterally. 2+DP/PT pulse bilaterally. Musculoskeletal: No joint deformities, No joint redness or warmth, no limitation of ROM in spin. Skin: No rashes.  Neuro: Alert, oriented X3, cranial nerves II-XII grossly intact, moves all extremities normally. Muscle strength 5/5 in all extremities, sensation to light touch intact. Brachial reflex 2+ bilaterally. Knee reflex 1+ bilaterally. Negative Babinski's sign. Normal finger to nose test. Psych: Patient is not psychotic, no suicidal or hemocidal ideation.  Labs on Admission: I have personally reviewed following labs and imaging studies  CBC: Recent Labs  Lab 09/20/20 1210  WBC 8.5  HGB 14.8  HCT 42.4  MCV 86.0  PLT 296   Basic Metabolic Panel: Recent Labs  Lab 09/20/20 1210  NA 131*  K 4.2  CL 98  CO2 24  GLUCOSE 427*  BUN 21  CREATININE 0.73  CALCIUM 9.3   GFR: Estimated Creatinine Clearance: 63.8 mL/min (by C-G formula based on SCr of 0.73 mg/dL). Liver Function Tests: Recent Labs  Lab 09/20/20 1210  AST 20  ALT 23  ALKPHOS 116  BILITOT 0.6  PROT 6.8  ALBUMIN 3.8   No results for input(s): LIPASE, AMYLASE in the last 168 hours. No results for input(s): AMMONIA in the last 168 hours. Coagulation Profile: Recent Labs  Lab 09/20/20 1650  INR 0.9   Cardiac Enzymes: No results for input(s): CKTOTAL, CKMB, CKMBINDEX, TROPONINI in the last 168 hours. BNP (last 3 results) No results for input(s): PROBNP in the last 8760 hours. HbA1C: No results for input(s): HGBA1C in the last 72  hours. CBG: Recent Labs  Lab 09/20/20 1205 09/20/20 1812  GLUCAP 397* 270*   Lipid Profile: No results for input(s): CHOL, HDL, LDLCALC, TRIG, CHOLHDL, LDLDIRECT in the last 72 hours. Thyroid Function Tests: No results for input(s): TSH, T4TOTAL, FREET4, T3FREE, THYROIDAB in the last 72 hours. Anemia Panel: No results for input(s): VITAMINB12, FOLATE, FERRITIN, TIBC, IRON, RETICCTPCT in the last 72 hours. Urine analysis:    Component Value Date/Time   COLORURINE YELLOW 09/20/2020 1210   APPEARANCEUR CLOUDY (A) 09/20/2020 1210   LABSPEC 1.015 09/20/2020 1210   PHURINE 5.0 09/20/2020 1210   GLUCOSEU >1,000 (A) 09/20/2020 1210   HGBUR NEGATIVE 09/20/2020 1210   BILIRUBINUR NEGATIVE 09/20/2020 1210   KETONESUR 15 (A) 09/20/2020 1210   PROTEINUR NEGATIVE 09/20/2020 1210   NITRITE POSITIVE (A) 09/20/2020 1210   LEUKOCYTESUR TRACE (A) 09/20/2020 1210   Sepsis Labs: @LABRCNTIP (procalcitonin:4,lacticidven:4) )No results found for this or any previous visit (from the past 240 hour(s)).  Radiological Exams on Admission: CT HEAD WO CONTRAST ( )  Result Date: 09/20/2020 CLINICAL DATA:  Neuro deficit, acute stroke suspected. EXAM: CT HEAD WITHOUT CONTRAST TECHNIQUE: Contiguous axial images were obtained from the base of the skull through the vertex without intravenous contrast. COMPARISON:  CT head 08/27/2016 BRAIN: BRAIN Cerebral ventricle sizes are concordant with the degree of cerebral volume loss. Patchy and confluent areas of decreased attenuation are noted throughout the deep and periventricular white matter of the cerebral hemispheres bilaterally, compatible with chronic microvascular ischemic disease. No evidence of large-territorial acute infarction. No parenchymal hemorrhage. No mass lesion. No extra-axial collection. No mass effect or midline shift. No hydrocephalus. Basilar cisterns are patent. Vascular: No hyperdense vessel. Skull: No acute fracture or focal lesion.  Sinuses/Orbits: Paranasal sinuses and mastoid air cells are clear. The orbits are unremarkable. Other: None. IMPRESSION: No acute intracranial abnormality. Electronically Signed   By: Tish Frederickson M.D.   On: 09/20/2020 17:51     EKG: Independently reviewed.  Not done in ED, will get one.   Assessment/Plan Principal Problem:   CVA (cerebral vascular accident) (HCC) Active Problems:   DM2 (diabetes mellitus, type 2) (HCC)   Primary hypertension   Acute lower UTI  TIA vs stroke: Presented with right arm weakness, facial droop patient also has multiple risk factors for stroke including HTN, DM.  Other differential diagnosis include, but less likely, cardiac arrhythmia (no palpitation, EKG did not show arrhythmia. No previous heart problems), Cocaine overdose (patient denies drug use). Neurology consulted by EDP Not candidate for tPA or thrombectomy per Neurology Head CT: No acute intracranial abnormality. Head MRI/MRA pending Carotid ultrasound pending Echo pending HbA1c and fasting lipid panel pending Tele monitoring Neuro check Q4h Aspiration and Fall Precautions. Bedside swallow screen and advance diet as tolerated if no aspiration. PT/OT/ST consultations. Aspirin and statin Permissive hypertension if acute stroke (will treat if > 220/120 mmHg)  #Hypertension: On permissive hypertension labetalol needed for SBP more than 220  #UTI: e/o symptoms, UA  -will admit to tele bed as inpt -will treat with IV Ceftriaxone, 1 g, every 24 hours. -Waiting urine culture and adjust abx according to sensitivities -check lactic level -IVF if needed  #Diabetes, hyperglycemia: On diabetes diet, ED started on sliding scale insulin, will continue A1c pending  #history significant of diabetes on Januvia and Lantus, gallstone, DDD, hypertension on Vasotec and recently started on Benicar, anxiety/depression Cymbalta who presented with dizziness and generalized weakness,  dysuria Established Active problems see above Continue home medications if appropriate follow-up with his PCP, specialist when necessary     DVT ppx:      SQ Lovenox Code Status: Full code Family Communication:   Yes, patient's   significant other at bed side Disposition Plan:  Anticipate discharge back to previous home environment Consults called:   Admission status: Obs / tele  Inpatient/tele   medical floor/obs     SDU/inpation       Date of Service 09/20/2020    Rayne Du [facility]   If 7PM-7AM, please contact night-coverage [email or web address] Password [facility] 09/20/2020, 6:42 PM

## 2020-09-20 NOTE — Progress Notes (Signed)
Neurology Telephone Recommendations  Contacted by Dr. Antoine Primas in ED regarding this patient. 65 yo woman with hx DM, gallstones, DDD presented with 3-4 days of dizziness and generalized weakness. Weakness is more pronounced on the R than the left. Today partner noticed new R facial droop. Patient found to have UTI and will be admitted for tx infection and stroke/TIA workup.  Interval recommendations:  - Permissive HTN x48 hrs from sx onset or until stroke ruled out by MRI goal BP <220/110. PRN labetalol or hydralazine if BP above these parameters. Avoid oral antihypertensives. - MRI brain wo contrast - MRA H&N - TTE w/ bubble - Check A1c and LDL + add statin per guidelines - ASA 325mg  now f/b 81mg  daily - q4 hr neuro checks - STAT head CT for any change in neuro exam - Tele - PT/OT/SLP  Neurology will see in consultation tmrw AM.  , MD [facility] [phone]  If 7pm- 7am, please page neurology on call as listed in AMION.

## 2020-09-20 NOTE — ED Notes (Signed)
Pharmacy contacted for missing med 

## 2020-09-20 NOTE — ED Notes (Signed)
This RN attempted IV access x2. No success. Another RN to attempt

## 2020-09-20 NOTE — ED Triage Notes (Signed)
Pt presents to ED with c/o of dizziness. Pt states this has been ongoing since Friday. Pt did fall out of the car when she arrived to the ED. Pt states her "legs just gave out". Pt denies injury during this fall, none noted by this RN. Pt states everything is bilateral and not unilaterally. Pt states she is a diabetic and her sugars live in the 300's and states today sugar was 400.

## 2020-09-20 NOTE — ED Notes (Signed)
Per Dr Katrinka Blazing, hold IV insulin until IVF finished, recheck CBG then

## 2020-09-20 NOTE — ED Notes (Signed)
CBG 397 

## 2020-09-20 NOTE — Progress Notes (Signed)
Pt passed stroke swallow screen. MD Katrinka Blazing notified and per MD okay to place patient on a carb modified diet.  Lamonte Richer, RN

## 2020-09-21 ENCOUNTER — Encounter: Payer: Self-pay | Admitting: Internal Medicine

## 2020-09-21 DIAGNOSIS — E1169 Type 2 diabetes mellitus with other specified complication: Secondary | ICD-10-CM | POA: Diagnosis not present

## 2020-09-21 DIAGNOSIS — N390 Urinary tract infection, site not specified: Secondary | ICD-10-CM | POA: Diagnosis not present

## 2020-09-21 DIAGNOSIS — R42 Dizziness and giddiness: Secondary | ICD-10-CM | POA: Diagnosis not present

## 2020-09-21 DIAGNOSIS — E440 Moderate protein-calorie malnutrition: Secondary | ICD-10-CM | POA: Insufficient documentation

## 2020-09-21 DIAGNOSIS — I639 Cerebral infarction, unspecified: Secondary | ICD-10-CM | POA: Diagnosis not present

## 2020-09-21 LAB — GLUCOSE, CAPILLARY
Glucose-Capillary: 129 mg/dL — ABNORMAL HIGH (ref 70–99)
Glucose-Capillary: 167 mg/dL — ABNORMAL HIGH (ref 70–99)
Glucose-Capillary: 169 mg/dL — ABNORMAL HIGH (ref 70–99)
Glucose-Capillary: 154 mg/dL — ABNORMAL HIGH (ref 70–99)
Glucose-Capillary: 170 mg/dL — ABNORMAL HIGH (ref 70–99)
Glucose-Capillary: 191 mg/dL — ABNORMAL HIGH (ref 70–99)

## 2020-09-21 LAB — LIPID PANEL
Cholesterol: 192 mg/dL (ref 0–200)
HDL: 40 mg/dL — ABNORMAL LOW
LDL Cholesterol: 111 mg/dL — ABNORMAL HIGH (ref 0–99)
Total CHOL/HDL Ratio: 4.8 ratio
Triglycerides: 203 mg/dL — ABNORMAL HIGH
VLDL: 41 mg/dL — ABNORMAL HIGH (ref 0–40)

## 2020-09-21 LAB — CBC
HCT: 38.8 % (ref 36.0–46.0)
Hemoglobin: 13.7 g/dL (ref 12.0–15.0)
MCH: 30.4 pg (ref 26.0–34.0)
MCHC: 35.3 g/dL (ref 30.0–36.0)
MCV: 86 fL (ref 80.0–100.0)
Platelets: 265 10*3/uL (ref 150–400)
RBC: 4.51 MIL/uL (ref 3.87–5.11)
RDW: 11.7 % (ref 11.5–15.5)
WBC: 7 10*3/uL (ref 4.0–10.5)
nRBC: 0 % (ref 0.0–0.2)

## 2020-09-21 LAB — CBG MONITORING, ED: Glucose-Capillary: 347 mg/dL — ABNORMAL HIGH (ref 70–99)

## 2020-09-21 LAB — LDL CHOLESTEROL, DIRECT: Direct LDL: 122.9 mg/dL — ABNORMAL HIGH (ref 0–99)

## 2020-09-21 LAB — HIV ANTIBODY (ROUTINE TESTING W REFLEX): HIV Screen 4th Generation wRfx: NONREACTIVE

## 2020-09-21 LAB — HEMOGLOBIN A1C
Hgb A1c MFr Bld: 11.6 % — ABNORMAL HIGH (ref 4.8–5.6)
Mean Plasma Glucose: 286.22 mg/dL

## 2020-09-21 LAB — CK: Total CK: 41 U/L (ref 38–234)

## 2020-09-21 MED ORDER — PROSOURCE PLUS PO LIQD
30.0000 mL | Freq: Two times a day (BID) | ORAL | Status: DC
  Administered 2020-09-21 – 2020-09-24 (×5): 30 mL via ORAL
  Filled 2020-09-21 (×9): qty 30

## 2020-09-21 MED ORDER — METOCLOPRAMIDE HCL 5 MG/ML IJ SOLN
10.0000 mg | Freq: Three times a day (TID) | INTRAMUSCULAR | Status: DC
  Administered 2020-09-21 – 2020-09-24 (×11): 10 mg via INTRAVENOUS
  Filled 2020-09-21 (×12): qty 2

## 2020-09-21 MED ORDER — ENSURE MAX PROTEIN PO LIQD
11.0000 [oz_av] | Freq: Every day | ORAL | Status: DC
  Administered 2020-09-21 – 2020-09-22 (×2): 11 [oz_av] via ORAL
  Filled 2020-09-21: qty 330

## 2020-09-21 MED ORDER — INFLUENZA VAC SPLIT QUAD 0.5 ML IM SUSY
0.5000 mL | PREFILLED_SYRINGE | INTRAMUSCULAR | Status: AC
  Administered 2020-09-23: 08:00:00 0.5 mL via INTRAMUSCULAR
  Filled 2020-09-21 (×2): qty 0.5

## 2020-09-21 MED ORDER — PNEUMOCOCCAL VAC POLYVALENT 25 MCG/0.5ML IJ INJ
0.5000 mL | INJECTION | INTRAMUSCULAR | Status: AC
  Administered 2020-09-22: 0.5 mL via INTRAMUSCULAR
  Filled 2020-09-21: qty 0.5

## 2020-09-21 MED ORDER — GLUCERNA SHAKE PO LIQD
237.0000 mL | Freq: Three times a day (TID) | ORAL | Status: DC
  Administered 2020-09-21 – 2020-09-27 (×17): 237 mL via ORAL

## 2020-09-21 MED ORDER — ONDANSETRON HCL 4 MG/2ML IJ SOLN
4.0000 mg | Freq: Four times a day (QID) | INTRAMUSCULAR | Status: DC | PRN
  Administered 2020-09-21: 4 mg via INTRAVENOUS
  Filled 2020-09-21 (×3): qty 2

## 2020-09-21 MED ORDER — ATORVASTATIN CALCIUM 20 MG PO TABS
40.0000 mg | ORAL_TABLET | Freq: Every day | ORAL | Status: DC
  Administered 2020-09-21 – 2020-10-01 (×11): 40 mg via ORAL
  Filled 2020-09-21 (×11): qty 2

## 2020-09-21 MED ORDER — IRBESARTAN 150 MG PO TABS
75.0000 mg | ORAL_TABLET | Freq: Every day | ORAL | Status: DC
  Administered 2020-09-21 – 2020-09-22 (×2): 75 mg via ORAL
  Filled 2020-09-21 (×2): qty 1

## 2020-09-21 NOTE — Progress Notes (Signed)
Initial Nutrition Assessment  DOCUMENTATION CODES:  Non-severe (moderate) malnutrition in context of chronic illness  INTERVENTION:  Add Glucerna Shake po TID, each supplement provides 220 kcal and 10 grams of protein.  Add Ensure Max po daily, each supplement provides 150 kcal and 30 grams of protein.    Add 30 ml ProSource Plus po TID, each supplement provides 100 kcal and 15 grams of protein.    Continue MVI with minerals daily.  NUTRITION DIAGNOSIS:  Moderate Malnutrition related to chronic illness (uncontrolled T2DM) as evidenced by mild fat depletion, mild muscle depletion.  GOAL:  Patient will meet greater than or equal to 90% of their needs  MONITOR:  PO intake, Supplement acceptance, Labs, Weight trends, I & O's  REASON FOR ASSESSMENT:  Malnutrition Screening Tool    ASSESSMENT:  65 yo female with a PMH of diabetes on Januvia and Lantus, gallstone, DDD, hypertension on Vasotec and recently started on Benicar, anxiety/depression Cymbalta who presented with dizziness and generalized weakness, dysuria. Admitted with CVA.  Spoke with pt at bedside. She reports emesis this morning, but she is no longer nauseous now. She also reports eating well PTA.   Pt reports maybe a few pounds down over the past few weeks. No recent weight history in Epic. Per Care Everywhere, pt weighed 68.5 kg on 08/28/20 and on admission, weighed 67.4 kg. This is a ~2.5 lb (1.6%) loss in 3 weeks, which is not necessarily significant for the time frame.   Pt is open to trying Glucerna shakes TID and ProSource Plus TID, as well as Ensure Max daily. Continue MVI.  Medications: reviewed; SSI, Reglan TID, MVI with minerals  Labs: reviewed; CBG 129-347 (H) HbA1c: 11% (09/20/2020)  NUTRITION - FOCUSED PHYSICAL EXAM: Flowsheet Row Most Recent Value  Orbital Region Mild depletion  [Mild edema]  Upper Arm Region No depletion  Thoracic and Lumbar Region No depletion  Buccal Region Mild depletion  Temple  Region Mild depletion  Clavicle Bone Region Mild depletion  Clavicle and Acromion Bone Region Mild depletion  Scapular Bone Region Unable to assess  Dorsal Hand No depletion  Patellar Region Mild depletion  Anterior Thigh Region No depletion  Posterior Calf Region No depletion  Edema (RD Assessment) Mild  [Orbital]  Hair Reviewed  Eyes Reviewed  Mouth Reviewed  Skin Reviewed  Nails Reviewed   Diet Order:   Diet Order             Diet Carb Modified Fluid consistency: Thin; Room service appropriate? Yes with Assist  Diet effective now                  EDUCATION NEEDS:  Education needs have been addressed  Skin:  Skin Assessment: Reviewed RN Assessment  Last BM:  09/19/20  Height:  Ht Readings from Last 1 Encounters:  09/21/20 5\' 2"  (1.575 m)   Weight:  Wt Readings from Last 1 Encounters:  09/21/20 67.4 kg   BMI:  Body mass index is 27.18 kg/m.  Estimated Nutritional Needs:  Kcal:  1800-2000 Protein:  75-90 grams Fluid:  >1.8 L  09/23/20, RD, LDN (she/her/hers) Registered Dietitian I After-Hours/Weekend Pager # in [place]

## 2020-09-21 NOTE — Evaluation (Signed)
Occupational Therapy Evaluation Patient Details Name: Lacey Gonzalez MRN: [id number] DOB: 28-Jul-1955 Today's Date: 09/21/2020   History of Present Illness Lacey Gonzalez is a 65 y.o. female admitted with reports of intermittent dizziness, weakness R>L side, R facial droop. 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 No hemorrhage or mass effect, Short segment occlusion of the right ACA A2 segment, Mild stenosis of the midportion of the left P2 segment, Motion degraded MRA of the neck without visible occlusion or high-grade stenosis.   Clinical Impression   Chart reviewed, pt greeted in room in bed, is agreeable to OT evaluation however requires encouragement for participation throughout. Pt reports nausea, vital signs remain stable throughout. Pt reports she is independent in ADL/IADL PTA, driver, retired lives alone in a multi story house with approx 4-6 STE per patient report. Pt reports she does laundry at her significant others, who she reports will be available to assist following discharge. Pt presents with generalized weakness throughout, notably in R dominant UE AROM and BLE. Pt RUE sensation appears impaired as evidenced by pt reporting numbness throughout however sensation is intact to light touch; OT will continue to assess. Pt FMC/dexterity is impaired bilaterally. Vision appears to be [facility] however pt declines further vision screening therefore will continue to be assess. All deficits affect independent completion of ADL/IADL. Pt educated on hemi dressing technique, importance of continued RUE functional use. Pt accepted all information, requires further education. Pt is left as received, NAD, all needs met. Because pt was independent in ADL/IADL  PTA and is performing below PLOF, OT recommends discharge to CIR to address functional deficits. OT will continue to follow while admitted.    Recommendations for follow up therapy are one component of a multi-disciplinary discharge planning process, led by the attending physician.  Recommendations may be updated based on patient status, additional functional criteria and insurance authorization.   Follow Up Recommendations  CIR    Equipment Recommendations   (per next venue of care)    Recommendations for Other Services       Precautions / Restrictions Precautions Precautions: Fall Restrictions Weight Bearing Restrictions: No      Mobility Bed Mobility Overal bed mobility: Needs Assistance Bed Mobility: Rolling;Supine to Sit;Sit to Supine Rolling: Min assist   Supine to sit: Min assist Sit to supine: Mod assist        Transfers Overall transfer level: Needs assistance Equipment used: Rolling walker (2 wheeled) Transfers: Sit to/from Stand Sit to Stand: Mod assist              Balance Overall balance assessment: Needs assistance Sitting-balance support: Feet supported Sitting balance-Leahy Scale: Fair Sitting balance - Comments: poserior lean, vcs required throughout for upright posture Postural control: Posterior lean Standing balance support: Bilateral upper extremity supported Standing balance-Leahy Scale: Poor                             ADL either performed or assessed with clinical judgement   ADL Overall ADL's : Needs assistance/impaired     Grooming: Oral care;Wash/dry face;Wash/dry hands;Minimal assistance Grooming Details (indicate cue type and reason): at edge of bed         Upper Body Dressing : Minimal assistance;Cueing for sequencing Upper Body Dressing  Details (indicate cue type and reason): vcs for hemi dressing technique Lower Body Dressing: Moderate assistance Lower Body Dressing Details (indicate cue type and reason):  doffing skirt (worn upon admission)             Functional mobility during ADLs: Rolling walker;Moderate assistance General ADL Comments: for STS for ADL participation     Vision Baseline Vision/History: 0 No visual deficits Patient Visual Report: No change from baseline Additional Comments: pt reports no visual concerns(diplopia, blurred vision) but declined further assessment; will continue to assess;     Perception     Praxis      Pertinent Vitals/Pain Pain Assessment: 0-10 Pain Score: 0-No pain     Hand Dominance Right   Extremity/Trunk Assessment Upper Extremity Assessment Upper Extremity Assessment: RUE deficits/detail RUE Deficits / Details: RUE AROM: shoulder flexion: 1/2 full range of motion, elbow flexion3/4 full ROM, elbow extension: full ROM, wrist extension: 3/4 full ROM, wrist flexion: 3/4 full ROM; RUE PROM WFL; LUE AROM: shoulder flexion: 3/4 full ROM, elbow, wrist, hand WFL; kinesthesia: WNL, pt deferred further testing, will continue to further assess sensation RUE Coordination: decreased fine motor;decreased gross motor   Lower Extremity Assessment Lower Extremity Assessment: LLE deficits/detail LLE Coordination: decreased gross motor       Communication Communication Communication: No difficulties   Cognition Arousal/Alertness: Awake/alert Behavior During Therapy: WFL for tasks assessed/performed Overall Cognitive Status: Within Functional Limits for tasks assessed                                 General Comments: Pt is alert and oriented, however affect appears flat   General Comments       Exercises     Shoulder Instructions      Home Living Family/patient expects to be discharged to:: Private residence Living Arrangements: Alone (pt reports she has significant other who can assist, do not live together) Available Help at Discharge: Friend(s);Available 24 hours/day (significant other) Type of Home: House Home Access:  Stairs to enter [employer] of Steps: per pt report 4-6 Entrance Stairs-Rails: Right;Left;Can reach both Home Layout: Two level Alternate Level Stairs-Number of Steps: flight of stairs- pt reports she prefers to sleep on the first floor on the couch             Home Equipment: Walker - 2 wheels;Cane - single point          Prior Functioning/Environment Level of Independence: Independent with assistive device(s)        Comments: pt reports she has walker and cane, does not use often        OT Problem List: Decreased strength;Decreased activity tolerance;Decreased knowledge of use of DME or AE;Impaired tone;Impaired UE functional use;Decreased range of motion;Impaired balance (sitting and/or standing);Decreased coordination;Decreased knowledge of precautions;Impaired sensation      OT Treatment/Interventions: Self-care/ADL training;Modalities;Balance training;Therapeutic exercise;DME and/or AE instruction;Splinting;Visual/perceptual remediation/compensation;Neuromuscular education;Therapeutic activities;Patient/family education;Manual therapy    OT Goals(Current goals can be found in the care plan section) Acute Rehab OT Goals Patient Stated Goal: to get better OT Goal Formulation: With patient Time For Goal Achievement: 10/05/20 Potential to Achieve Goals: Good ADL Goals Pt Will Perform Grooming: standing;with modified independence Pt Will Perform Upper Body Bathing: with modified independence Pt Will Perform Upper Body Dressing: with modified independence Pt Will Perform Lower Body Dressing: with modified independence Pt Will Transfer to Toilet: with min guard assist  OT Frequency: Min 3X/week  AM-PAC OT "6 Clicks" Daily Activity     Outcome Measure Help from another person eating meals?: A Little Help from another person taking care of personal grooming?: A Little Help from another person toileting, which includes using toliet, bedpan, or urinal?: A  Lot Help from another person bathing (including washing, rinsing, drying)?: A Lot Help from another person to put on and taking off regular upper body clothing?: A Little Help from another person to put on and taking off regular lower body clothing?: A Lot 6 Click Score: 15   End of Session Equipment Utilized During Treatment: Gait belt;Rolling walker  Activity Tolerance: Patient tolerated treatment well Patient left: in bed;with bed alarm set  OT Visit Diagnosis: Unsteadiness on feet (R26.81);Muscle weakness (generalized) (M62.81);History of falling (Z91.81);Other abnormalities of gait and mobility (R26.89);Hemiplegia and hemiparesis Hemiplegia - Right/Left: Right Hemiplegia - dominant/non-dominant: Dominant                Time: 6728-9791 OT Time Calculation (min): 23 min Charges:  OT General Charges $OT Visit: 1 Visit OT Evaluation $OT Eval Moderate Complexity: 1 Mod OT Treatments $Self Care/Home Management : 8-22 mins Shanon Payor, OTD OTR/L  09/21/20, 11:47 AM

## 2020-09-21 NOTE — Progress Notes (Addendum)
Inpatient Diabetes Program Recommendations  AACE/ADA: New Consensus Statement on Inpatient Glycemic Control   Target Ranges:  Prepandial:   less than 140 mg/dL      Peak postprandial:   less than 180 mg/dL (1-2 hours)      Critically ill patients:  140 - 180 mg/dL   Results for Lacey Gonzalez, Lacey Gonzalez (MRN [id number]) as of 09/21/2020 11:42  Ref. Range 09/20/2020 12:05 09/20/2020 18:12 09/20/2020 19:43 09/21/2020 00:19 09/21/2020 06:16 09/21/2020 07:41  Glucose-Capillary Latest Ref Range: 70 - 99 mg/dL 536 (H) 144 (H) 315 (H) 347 (H) 129 (H) 167 (H)  Results for Lacey Gonzalez, Lacey Gonzalez (MRN [id number]) as of 09/21/2020 11:42  Ref. Range 09/20/2020 16:50 09/21/2020 04:24  Hemoglobin A1C Latest Ref Range: 4.8 - 5.6 % 11.0 (H) 11.6 (H)    Review of Glycemic Control  Diabetes history: DM2 Outpatient Diabetes medications: Lantus 40 units QHS, Januvia 100 mg daily Current orders for Inpatient glycemic control: Novolog 0-15 units Q4H  Inpatient Diabetes Program Recommendations:    HbgA1C:  A1C 11.6% on 09/21/20 indicating an average glucose of 286 mg/dl over the past 2-3 months.  Addendum 09/21/20@15 :05-In reviewing chart, noted in Care Everywhere that patient seen PCP on 07/31/20 and had been reluctant to take injectables in the past but was started on Lantus 10 units QHS with instructions to increase by 3 units every 3 days if glucose was over 200 mg/dl. Noted patient seen PCP again on 08/28/20 and per note, "Pt is currently on Januvia 100mg  daily and Lantus. It is hard to ascertain what dosage she is currently taking as you does not understand how to measure insulin. It appears upon review with her that she is taking 18u daily. She is reluctant to start anymore medications."   Spoke with patient about diabetes and home regimen for diabetes control. Patient very drowsy but awakened to calling her name. Patient kept eyes closed most of conversation. Patient reports that her PCP manages her DM. Patient states she has been referred  to an Endocrinologist but she has not seen one yet.  Patient reports taking Lantus 45 units daily (vial/syringe) and Januvia 100 mg daily for DM control. Patient states she is taking medications as prescribed and she states she is taking insulin consistently. Patient reports her glucose has still been running high at home despite taking insulin consistently.  Discussed A1C results (11.6% on 09/21/20) and explained that current A1C indicates an average glucose of 286 mg/dl over the past 2-3 months. Discussed glucose and A1C goals. Discussed importance of checking CBGs and maintaining good CBG control to prevent long-term and short-term complications. Explained how hyperglycemia leads to damage within blood vessels which lead to the common complications seen with uncontrolled diabetes. Stressed to the patient the importance of improving glycemic control to prevent further complications from uncontrolled diabetes. Patient notes that she has neuropathy which she has had for a long time but it seems to be getting worse over the past few months. Encouraged patient to get appointment with Endocrinologist to assist with improving DM control to prevent from developing further complications from uncontrolled DM.  Patient verbalized understanding of information discussed and reports no further questions at this time related to diabetes.  Thanks, 09/23/20, RN, MSN, CDE Diabetes Coordinator Inpatient Diabetes Program [phone] (Team Pager from 8am to 5pm)

## 2020-09-21 NOTE — Evaluation (Addendum)
Speech Language Pathology Evaluation Patient Details Name: Lacey Gonzalez MRN: [id number] DOB: February 07, 1955 Today's Date: 09/21/2020 Time: 6294-7654 SLP Time Calculation (min) (ACUTE ONLY): 45 min  Problem List:  Patient Active Problem List   Diagnosis Date Noted   Malnutrition of moderate degree 09/21/2020   CVA (cerebral vascular accident) (HCC) 09/20/2020   Acute lower UTI 09/20/2020   Anxiety and depression 07/03/2020   Primary hypertension 07/03/2020   DM2 (diabetes mellitus, type 2) (HCC) 04/13/2017   Past Medical History:  Past Medical History:  Diagnosis Date   DDD (degenerative disc disease), lumbar    Diabetes mellitus without complication (HCC)    Gallstones    Past Surgical History: History reviewed. No pertinent surgical history. HPI:  Pt is a 65 y.o. female admitted with reports of intermittent dizziness, weakness R>L side, R facial droop. 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 No hemorrhage or mass effect, Short segment occlusion of the right ACA A2 segment, Mild stenosis of the midportion of the left P2 segment, Motion degraded MRA of the neck without visible occlusion or high-grade stenosis.   Assessment / Plan / Recommendation Clinical Impression  Patient seen today for an informal assessment of Dysarthria and Cognitive-linguistic abilities. Pt presents with Mild+ dysarthria due to neuromuscular deficits including Right sided facial and labial weakness, Mildly decreased awareness of deficits and how they impact speech and communication, and Mildly decreased Cognitive awareness in various general tasks as she did not attempt to problem solve during some functional tasks at Lunch meal. She responded  well given verbal cues, but if there is any decreased insight and awareness of herself and her situation, this could impact problem-solving and reasoning during functional ADLs as well as safety.  Pt's Dysarthria appeared Mild; overall intelligibility was ~90% in general conversation. Any R labial weakness did not appear to impact oral prep, or oral, phases of swallowing as she consumed sips of thin liquids during session. Pt was given education and verbal/visual cues for using strategies of emphasizing speech sounds and volume(Loudness) to improve her speech articulation and intelligibility. Pt was able to model strategies during the instruction. Pt presented w/ Mild+ Right labial oral motor weakness; dcreased Right labial tone and ROM; only a slight Right lingual deviation during protrusion but not consistent.         During receptive and expressive speech-language tasks, pt was able to complete tasks of fluent speech, identifying items needed, naming, repetition, following 1-2 step instructions. Overall, speech-language skills appeared functional. During Cognitive tasks, recall of new information required verbal cue for full accuracy. Executive function tasks required cues as well; noted decreased awareness of, and insight into, certain situational problems and cues were needed for follow through w/ the problem situation; mild frustration was noted also (lying slid down in the bed eating/drinking her meal w/ no attempt to rectify her positioning),  Orientation was appropriate.       Recommend at dischage, pt be followed by ST services for more formal Cognitive-linguistic assessment in order to address any safety concerns/issues as pt lived independently prior to admit. Recommend f/u w/ ST services for Dysarthria tx as needed for follow through w/ OM Exercises and speech strategies to lessen impact of Dysarthria on verbal communication in her ADLs.    SLP Assessment  SLP Recommendation/Assessment: All  further Speech Lanaguage Pathology  needs can be addressed in the next venue of care SLP Visit Diagnosis: Dysarthria and anarthria (R47.1);Cognitive communication deficit (R41.841) (mild)    Recommendations for follow up therapy are one component of a multi-disciplinary discharge planning process, led by the attending physician.  Recommendations may be updated based on patient status, additional functional criteria and insurance authorization.    Follow Up Recommendations  Inpatient Rehab    Frequency and Duration   CIR        SLP Evaluation Cognition  Overall Cognitive Status: Within Functional Limits for tasks assessed Arousal/Alertness: Awake/alert Orientation Level: Oriented to person;Oriented to place;Oriented to time;Oriented to situation Year: 2022 Month: September Attention: Focused;Sustained Focused Attention: Appears intact Sustained Attention: Appears intact (adequate) Memory: Impaired Memory Impairment: Decreased recall of new information Immediate Memory Recall: Sock;Bed Memory Recall Blue: With Cue Awareness: Impaired Awareness Impairment: Anticipatory impairment Problem Solving: Impaired Executive Function: Reasoning;Organizing;Decision Making (less prompt follow through w/ situations) Behaviors: Poor frustration tolerance ("well, i know that") Safety/Judgment: Impaired       Comprehension  Auditory Comprehension Overall Auditory Comprehension: Appears within functional limits for tasks assessed Yes/No Questions: Within Functional Limits Commands: Within Functional Limits Conversation: Simple Visual Recognition/Discrimination Discrimination: Not tested Reading Comprehension Reading Status: Not tested    Expression Expression Primary Mode of Expression: Verbal Verbal Expression Overall Verbal Expression: Appears within functional limits for tasks assessed Initiation: No impairment Automatic Speech: Name;Social Response;Day of week Level of  Generative/Spontaneous Verbalization: Sentence Repetition: No impairment Naming: No impairment Non-Verbal Means of Communication: Not applicable Written Expression Dominant Hand: Right Written Expression: Not tested   Oral / Motor  Oral Motor/Sensory Function Overall Oral Motor/Sensory Function: Mild impairment Facial ROM: Reduced right;Suspected CN VII (facial) dysfunction Facial Symmetry: Abnormal symmetry right;Suspected CN VII (facial) dysfunction Facial Strength: Reduced right;Suspected CN VII (facial) dysfunction Facial Sensation: Within Functional Limits Lingual ROM: Within Functional Limits Lingual Symmetry: Within Functional Limits Lingual Strength: Within Functional Limits Velum: Within Functional Limits Mandible: Within Functional Limits Motor Speech Overall Motor Speech: Impaired Respiration: Within functional limits Phonation: Normal Resonance: Within functional limits Articulation: Impaired Level of Impairment: Word Intelligibility: Intelligibility reduced Word: 75-100% accurate Sentence: 75-100% accurate Conversation: 75-100% accurate Motor Planning: Witnin functional limits Motor Speech Errors: Not applicable Effective Techniques: Slow rate;Increased vocal intensity;Over-articulate   GO                       Jerilynn Som, MS, CCC-SLP Speech Language Pathologist Rehab Services [phone] [facility] 09/21/2020, 3:10 PM

## 2020-09-21 NOTE — ED Notes (Signed)
Pt assisted to BSC

## 2020-09-21 NOTE — Progress Notes (Signed)
PT Cancellation Note  Patient Details Name: Lacey Gonzalez MRN: [id number] DOB: 11/12/1955   Cancelled Treatment:    Reason Eval/Treat Not Completed: Patient declined, no reason specified.  Pt requested not to be seen at this time due to fatigue and requesting sleep.  Pt given maximal encouragement, however still refused.     Nolon Bussing, PT, DPT 09/21/20, 11:04 AM

## 2020-09-21 NOTE — Progress Notes (Signed)
PROGRESS NOTE    Lacey Gonzalez  ATF:[id number] DOB: [birth year] DOA: 09/20/2020 PCP: [facility]    Brief Narrative:  65 year old female with a history of diabetes, hypertension, admitted to the hospital with dizziness, generalized weakness.  It was reported that she had right-sided facial droop, and weakness in her right arm and leg.  She was admitted for further work-up for CVA.  MRI brain confirmed acute infarct involving left thalamus and left cerebral peduncle.  Neurology following.   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 -Neurology following, appreciate assistance -She is on aspirin, not on antiplatelet agents prior to admission -Started on Lipitor for elevated LDL, CK normal -No significant findings on neck vessels -Echocardiogram pending -Seen by OT with recommendations for CIR -PT eval pending  Urinary tract infection -Urinalysis with possible infection -Currently on ceftriaxone -Follow-up culture  Insulin-dependent diabetes -Reports taking Lantus at home -Basal insulin was not continued here in the hospital and blood sugars have not been significantly elevated -Continue on sliding scale and adjust regimen as needed -A1c 11.6  Diabetic gastroparesis -Continue on Reglan  Hypertension -Blood pressure currently elevated -We will resume ARB at lower dose and titrate back up to home dose   DVT prophylaxis: enoxaparin (LOVENOX) injection 40 mg Start: 09/20/20 2015 SCD's Start: 09/20/20 1957  Code Status: Full code Family Communication: No family present Disposition Plan: Status is: Inpatient  Remains inpatient appropriate because:Ongoing diagnostic testing needed not appropriate for  outpatient work up, Unsafe d/c plan, and Inpatient level of care appropriate due to severity of illness  Dispo: The patient is from: Home              Anticipated d/c is to: CIR              Patient currently is not medically stable to d/c.   Difficult to place patient No         Consultants:  Neurology  Procedures:    Antimicrobials:  Ceftriaxone 9/18   Subjective: Patient is somnolent, briefly wakes up to voice.  Objective: Vitals:   09/21/20 0744 09/21/20 1202 09/21/20 1655 09/21/20 1959  BP: (!) 148/69 (!) 176/83 (!) 163/76 (!) 179/75  Pulse: 88 82 86 84  Resp: 16 18 17 16   Temp: 98.6 F (37 C) 98.2 F (36.8 C) 98.4 F (36.9 C) 98.2 F (36.8 C)  TempSrc: Oral  Oral   SpO2: 98% 97% 97% 99%  Weight:      Height:        Intake/Output Summary (Last 24 hours) at 09/21/2020 2111 Last data filed at 09/21/2020 1300 Gross per 24 hour  Intake 1075.91 ml  Output --  Net 1075.91 ml   Filed Weights   09/20/20 1206 09/21/20 0101  Weight: 67.1 kg 67.4 kg    Examination:  General exam: Patient is somnolent, briefly wakes up to voice Respiratory system: Clear to auscultation. Respiratory effort normal. Cardiovascular system: S1 & S2 heard, RRR. No JVD, murmurs, rubs, gallops or clicks.  Gastrointestinal system: Abdomen is nondistended, soft and nontender. No organomegaly or masses felt. Normal bowel sounds heard. Central nervous system: Patient is somnolent, does not participate in exam Extremities: No edema bilaterally Skin: No rashes, lesions or ulcers Psychiatry:  Somnolent     Data Reviewed: I have personally reviewed following labs and imaging studies  CBC: Recent Labs  Lab 09/20/20 1210 09/21/20 0424  WBC 8.5 7.0  HGB 14.8 13.7  HCT 42.4 38.8  MCV 86.0 86.0  PLT 296 265   Basic Metabolic Panel: Recent Labs  Lab 09/20/20 1210  NA 131*  K 4.2  CL 98  CO2 24  GLUCOSE 427*  BUN 21  CREATININE 0.73  CALCIUM 9.3   GFR: Estimated  Creatinine Clearance: 63.9 mL/min (by C-G formula based on SCr of 0.73 mg/dL). Liver Function Tests: Recent Labs  Lab 09/20/20 1210  AST 20  ALT 23  ALKPHOS 116  BILITOT 0.6  PROT 6.8  ALBUMIN 3.8   No results for input(s): LIPASE, AMYLASE in the last 168 hours. No results for input(s): AMMONIA in the last 168 hours. Coagulation Profile: Recent Labs  Lab 09/20/20 1650  INR 0.9   Cardiac Enzymes: Recent Labs  Lab 09/21/20 0424  CKTOTAL 41   BNP (last 3 results) No results for input(s): PROBNP in the last 8760 hours. HbA1C: Recent Labs    09/20/20 1650 09/21/20 0424  HGBA1C 11.0* 11.6*   CBG: Recent Labs  Lab 09/21/20 0741 09/21/20 1204 09/21/20 1257 09/21/20 1617 09/21/20 1958  GLUCAP 167* 169* 154* 170* 191*   Lipid Profile: Recent Labs    09/20/20 1210 09/21/20 0424  CHOL  --  192  HDL  --  40*  LDLCALC  --  111*  TRIG  --  203*  CHOLHDL  --  4.8  LDLDIRECT 122.9*  --    Thyroid Function Tests: No results for input(s): TSH, T4TOTAL, FREET4, T3FREE, THYROIDAB in the last 72 hours. Anemia Panel: No results for input(s): VITAMINB12, FOLATE, FERRITIN, TIBC, IRON, RETICCTPCT in the last 72 hours. Sepsis Labs: No results for input(s): PROCALCITON, LATICACIDVEN in the last 168 hours.  Recent Results (from the past 240 hour(s))  Urine Culture     Status: Abnormal (Preliminary result)   Collection Time: 09/20/20 12:10 PM   Specimen: Urine, Clean Catch  Result Value Ref Range Status   Specimen Description   Final    URINE, CLEAN CATCH Performed at [facility], [address], [place], [place] [address]    Special Requests   Final    NONE Performed at [facility], [address], [place], [place] [address]    Culture (A)  Final    >=100,000 COLONIES/mL ESCHERICHIA COLI SUSCEPTIBILITIES TO FOLLOW Performed at [facility], [address] [place], [place] [address]    Report Status PENDING  Incomplete  Resp Panel by  RT-PCR (Flu A&B, Covid) Nasopharyngeal Swab     Status: None   Collection Time: 09/20/20 10:02 PM   Specimen: Nasopharyngeal Swab; Nasopharyngeal(NP) swabs in vial transport medium  Result Value Ref Range Status   SARS Coronavirus 2 by RT PCR NEGATIVE NEGATIVE Final    Comment: (NOTE) SARS-CoV-2 target nucleic acids are NOT DETECTED.  The SARS-CoV-2 RNA is generally detectable in upper respiratory specimens during the acute phase of infection. The lowest concentration of SARS-CoV-2 viral copies this assay can detect is 138 copies/mL. A negative result does not preclude SARS-Cov-2 infection and should not be used as the sole basis for treatment or other patient management decisions. A negative result may occur with  improper specimen collection/handling, submission of specimen other than nasopharyngeal swab, presence of viral mutation(s) within the areas targeted by this assay, and inadequate number  of viral copies(<138 copies/mL). A negative result must be combined with clinical observations, patient history, and epidemiological information. The expected result is Negative.  Fact Sheet for Patients:  BloggerCourse.com  Fact Sheet for Healthcare Providers:  SeriousBroker.it  This test is no t yet approved or cleared by the [place] FDA and  has been authorized for detection and/or diagnosis of SARS-CoV-2 by FDA under an Emergency Use Authorization (EUA). This EUA will remain  in effect (meaning this test can be used) for the duration of the COVID-19 declaration under Section 564(b)(1) of the Act, 21 U.S.C.section 360bbb-3(b)(1), unless the authorization is terminated  or revoked sooner.       Influenza A by PCR NEGATIVE NEGATIVE Final   Influenza B by PCR NEGATIVE NEGATIVE Final    Comment: (NOTE) The Xpert Xpress SARS-CoV-2/FLU/RSV plus assay is intended as an aid in the diagnosis of influenza from Nasopharyngeal swab  specimens and should not be used as a sole basis for treatment. Nasal washings and aspirates are unacceptable for Xpert Xpress SARS-CoV-2/FLU/RSV testing.  Fact Sheet for Patients: BloggerCourse.com  Fact Sheet for Healthcare Providers: SeriousBroker.it  This test is not yet approved or cleared by the [place] FDA and has been authorized for detection and/or diagnosis of SARS-CoV-2 by FDA under an Emergency Use Authorization (EUA). This EUA will remain in effect (meaning this test can be used) for the duration of the COVID-19 declaration under Section 564(b)(1) of the Act, 21 U.S.C. section 360bbb-3(b)(1), unless the authorization is terminated or revoked.  Performed at [facility], [address], [place], [place] [address]          Radiology Studies: CT HEAD WO CONTRAST ( )  Result Date: 09/20/2020 CLINICAL DATA:  Neuro deficit, acute stroke suspected. EXAM: CT HEAD WITHOUT CONTRAST TECHNIQUE: Contiguous axial images were obtained from the base of the skull through the vertex without intravenous contrast. COMPARISON:  CT head 08/27/2016 BRAIN: BRAIN Cerebral ventricle sizes are concordant with the degree of cerebral volume loss. Patchy and confluent areas of decreased attenuation are noted throughout the deep and periventricular white matter of the cerebral hemispheres bilaterally, compatible with chronic microvascular ischemic disease. No evidence of large-territorial acute infarction. No parenchymal hemorrhage. No mass lesion. No extra-axial collection. No mass effect or midline shift. No hydrocephalus. Basilar cisterns are patent. Vascular: No hyperdense vessel. Skull: No acute fracture or focal lesion. Sinuses/Orbits: Paranasal sinuses and mastoid air cells are clear. The orbits are unremarkable. Other: None. IMPRESSION: No acute intracranial abnormality. Electronically Signed   By: Tish Frederickson M.D.   On:  09/20/2020 17:51   MR ANGIO HEAD WO CONTRAST  Result Date: 09/20/2020 CLINICAL DATA:  Dizziness EXAM: MRI HEAD WITHOUT CONTRAST MRA HEAD WITHOUT CONTRAST MRA NECK WITHOUT CONTRAST TECHNIQUE: Multiplanar, multiecho pulse sequences of the brain and surrounding structures were obtained without intravenous contrast. Angiographic images of the Circle of Willis were obtained using MRA technique without intravenous contrast. Angiographic images of the neck were obtained using MRA technique without intravenous contrast. Carotid stenosis measurements (when applicable) are obtained utilizing NASCET criteria, using the distal internal carotid diameter as the denominator. COMPARISON:  None. FINDINGS: MRI HEAD FINDINGS Brain: There is a small acute infarct of the left thalamus extending into the left cerebral peduncle. No acute or chronic hemorrhage. There is multifocal hyperintense T2-weighted signal within the white matter. Parenchymal volume and CSF spaces are normal. The midline structures are normal. Vascular: Major flow voids are preserved. Skull and upper cervical spine: Normal calvarium and  skull base. Visualized upper cervical spine and soft tissues are normal. Sinuses/Orbits:No paranasal sinus fluid levels or advanced mucosal thickening. No mastoid or middle ear effusion. Normal orbits. MRA HEAD FINDINGS POSTERIOR CIRCULATION: --Vertebral arteries: Normal --Inferior cerebellar arteries: Normal. --Basilar artery: Normal. --Superior cerebellar arteries: Normal. --Posterior cerebral arteries: Mild stenosis of the midportion of the left P2 segment. Normal right. Both P comms are patent. ANTERIOR CIRCULATION: --Intracranial internal carotid arteries: Normal. 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 Normal left. --Middle cerebral arteries (MCA): Normal. ANATOMIC VARIANTS: Both posterior communicating arteries are patent. MRA NECK FINDINGS Study quality is degraded by motion  and lack of intravenous contrast agent. Within that limitation, the V2 and V3 segments of both vertebral arteries are patent. There is no occlusion of the distal common carotid arteries or the proximal internal carotid arteries. IMPRESSION: 1. Small acute infarct of the left thalamus extending into the left cerebral peduncle. No hemorrhage or mass effect. 2. 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 3. Mild stenosis of the midportion of the left P2 segment. 4. Motion degraded MRA of the neck without visible occlusion or high-grade stenosis. Electronically Signed   By: Deatra Robinson M.D.   On: 09/20/2020 19:45   MR ANGIO NECK WO CONTRAST  Result Date: 09/20/2020 CLINICAL DATA:  Dizziness EXAM: MRI HEAD WITHOUT CONTRAST MRA HEAD WITHOUT CONTRAST MRA NECK WITHOUT CONTRAST TECHNIQUE: Multiplanar, multiecho pulse sequences of the brain and surrounding structures were obtained without intravenous contrast. Angiographic images of the Circle of Willis were obtained using MRA technique without intravenous contrast. Angiographic images of the neck were obtained using MRA technique without intravenous contrast. Carotid stenosis measurements (when applicable) are obtained utilizing NASCET criteria, using the distal internal carotid diameter as the denominator. COMPARISON:  None. FINDINGS: MRI HEAD FINDINGS Brain: There is a small acute infarct of the left thalamus extending into the left cerebral peduncle. No acute or chronic hemorrhage. There is multifocal hyperintense T2-weighted signal within the white matter. Parenchymal volume and CSF spaces are normal. The midline structures are normal. Vascular: Major flow voids are preserved. Skull and upper cervical spine: Normal calvarium and skull base. Visualized upper cervical spine and soft tissues are normal. Sinuses/Orbits:No paranasal sinus fluid levels or advanced mucosal thickening. No mastoid or middle ear effusion. Normal orbits. MRA HEAD FINDINGS POSTERIOR  CIRCULATION: --Vertebral arteries: Normal --Inferior cerebellar arteries: Normal. --Basilar artery: Normal. --Superior cerebellar arteries: Normal. --Posterior cerebral arteries: Mild stenosis of the midportion of the left P2 segment. Normal right. Both P comms are patent. ANTERIOR CIRCULATION: --Intracranial internal carotid arteries: Normal. 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 Normal left. --Middle cerebral arteries (MCA): Normal. ANATOMIC VARIANTS: Both posterior communicating arteries are patent. MRA NECK FINDINGS Study quality is degraded by motion and lack of intravenous contrast agent. Within that limitation, the V2 and V3 segments of both vertebral arteries are patent. There is no occlusion of the distal common carotid arteries or the proximal internal carotid arteries. IMPRESSION: 1. Small acute infarct of the left thalamus extending into the left cerebral peduncle. No hemorrhage or mass effect. 2. 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 3. Mild stenosis of the midportion of the left P2 segment. 4. Motion degraded MRA of the neck without visible occlusion or high-grade stenosis. Electronically Signed   By: Deatra Robinson M.D.   On: 09/20/2020 19:45   MR BRAIN WO CONTRAST  Result Date: 09/20/2020 CLINICAL DATA:  Dizziness EXAM: MRI HEAD WITHOUT CONTRAST MRA HEAD WITHOUT CONTRAST MRA NECK WITHOUT CONTRAST TECHNIQUE: Multiplanar, multiecho pulse sequences of the brain and surrounding structures were obtained without intravenous contrast. Angiographic images of the Circle of Willis were obtained using MRA technique without intravenous contrast. Angiographic images of the neck were obtained using MRA technique without intravenous contrast. Carotid stenosis measurements (when applicable) are obtained utilizing NASCET criteria, using the distal internal carotid diameter as the denominator. COMPARISON:  None. FINDINGS: MRI HEAD FINDINGS Brain:  There is a small acute infarct of the left thalamus extending into the left cerebral peduncle. No acute or chronic hemorrhage. There is multifocal hyperintense T2-weighted signal within the white matter. Parenchymal volume and CSF spaces are normal. The midline structures are normal. Vascular: Major flow voids are preserved. Skull and upper cervical spine: Normal calvarium and skull base. Visualized upper cervical spine and soft tissues are normal. Sinuses/Orbits:No paranasal sinus fluid levels or advanced mucosal thickening. No mastoid or middle ear effusion. Normal orbits. MRA HEAD FINDINGS POSTERIOR CIRCULATION: --Vertebral arteries: Normal --Inferior cerebellar arteries: Normal. --Basilar artery: Normal. --Superior cerebellar arteries: Normal. --Posterior cerebral arteries: Mild stenosis of the midportion of the left P2 segment. Normal right. Both P comms are patent. ANTERIOR CIRCULATION: --Intracranial internal carotid arteries: Normal. 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 Normal left. --Middle cerebral arteries (MCA): Normal. ANATOMIC VARIANTS: Both posterior communicating arteries are patent. MRA NECK FINDINGS Study quality is degraded by motion and lack of intravenous contrast agent. Within that limitation, the V2 and V3 segments of both vertebral arteries are patent. There is no occlusion of the distal common carotid arteries or the proximal internal carotid arteries. IMPRESSION: 1. Small acute infarct of the left thalamus extending into the left cerebral peduncle. No hemorrhage or mass effect. 2. 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 3. Mild stenosis of the midportion of the left P2 segment. 4. Motion degraded MRA of the neck without visible occlusion or high-grade stenosis. Electronically Signed   By: Deatra Robinson M.D.   On: 09/20/2020 19:45   US Carotid Bilateral (at [facility] and [facility] only)  Result Date: 09/20/2020 CLINICAL DATA:  Stroke.  Dizziness. Bilateral leg and white arm weakness. Syncope and diabetes. EXAM: BILATERAL CAROTID DUPLEX ULTRASOUND TECHNIQUE: Wallace Cullens scale imaging, color Doppler and duplex ultrasound were performed of bilateral carotid and vertebral arteries in the neck. COMPARISON:  None. FINDINGS: Criteria: Quantification of carotid stenosis is based on velocity parameters that correlate the residual internal carotid diameter with NASCET-based stenosis levels, using the diameter of the distal internal carotid lumen as the denominator for stenosis measurement. The following velocity measurements were obtained: RIGHT ICA: 86/28 cm/sec CCA: 68/14 cm/sec SYSTOLIC ICA/CCA RATIO:  1.3 ECA: 77 cm/sec LEFT ICA: 120/30 cm/sec CCA: 89/12 cm/sec SYSTOLIC ICA/CCA RATIO:  1.4 ECA: 58 cm/sec RIGHT CAROTID ARTERY: Mild intimal thickening without significant calcific plaque formation. Homogeneous flow demonstrated in the carotid bifurcation on color flow Doppler imaging. Normal flow velocity waveforms. RIGHT VERTEBRAL ARTERY:  Patent with antegrade flow direction. LEFT CAROTID ARTERY: Mild noncalcific plaque formation. Homogeneous flow demonstrated in the carotid bifurcation on color flow Doppler imaging. Normal flow velocity waveforms. LEFT VERTEBRAL ARTERY:  Patent with antegrade flow direction. IMPRESSION: No evidence of hemodynamically significant stenosis of either right or the left internal carotid artery. Noncalcific plaque formation is demonstrated in the distal left common carotid artery without significant flow alteration. Electronically Signed   By: Burman Nieves M.D.   On:  09/20/2020 22:22        Scheduled Meds:  (feeding supplement) PROSource Plus  30 mL Oral BID BM   aspirin EC  81 mg Oral Daily   atorvastatin  40 mg Oral Daily   DULoxetine  30 mg Oral Daily   enoxaparin (LOVENOX) injection  40 mg Subcutaneous Q24H   feeding supplement (GLUCERNA SHAKE)  237 mL Oral TID BM   [START ON 09/22/2020] influenza vac split  quadrivalent PF  0.5 mL Intramuscular Tomorrow-1000   insulin aspart  0-15 Units Subcutaneous Q4H   irbesartan  75 mg Oral Daily   metoCLOPramide (REGLAN) injection  10 mg Intravenous TID AC   multivitamin with minerals  1 tablet Oral Daily   [START ON 09/22/2020] pneumococcal 23 valent vaccine  0.5 mL Intramuscular Tomorrow-1000   Ensure Max Protein  11 oz Oral Daily   Continuous Infusions:  cefTRIAXone (ROCEPHIN)  IV     cefTRIAXone (ROCEPHIN)  IV 1 g (09/21/20 1232)     LOS: 1 day    Time spent:    Erick Blinks, MD [facility]   If 7PM-7AM, please contact night-coverage [email or web address]  09/21/2020, 9:11 PM

## 2020-09-21 NOTE — Progress Notes (Signed)
Off floor to dialysis.

## 2020-09-21 NOTE — Progress Notes (Signed)
PT Cancellation Note  Patient Details Name: TZIPORA MCINROY MRN: [id number] DOB: May 18, 1955   Cancelled Treatment:    Reason Eval/Treat Not Completed: Patient declined, no reason specified.  Pt peacefully resting in bed and wished to not be disturbed.  Will re-attempt evaluation at later date.     Nolon Bussing, PT, DPT 09/21/20, 4:15 PM

## 2020-09-21 NOTE — Consult Note (Signed)
NEURO HOSPITALIST CONSULT NOTE   Requestig physician: Dr. 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 female with DDD, DM, HTN, depression, anxiety and cholelithiasis who presented yesterday afternoon with complaints of dizziness since Friday. She did fall out of her car when she arrived to the ED, stating that her legs gave out. She endorsed diffuse weakness on presentation with her subjective weakness being slightly greater in her RUE, but also involving her BLE. Her partner had also noticed a new right facial droop. Her CBG in the ED was 397. Labs were also consistent with a UTI. 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  Past Medical History:  Diagnosis Date   DDD (degenerative disc disease), lumbar    Diabetes mellitus without complication (HCC)    Gallstones     History reviewed. No pertinent surgical history.  History reviewed. No pertinent family history.         Social History:  reports that she has never smoked. She has never used smokeless tobacco. She reports current alcohol use. She reports that she does not use drugs.  No Known Allergies  MEDICATIONS:                                                                                                                     Prior to Admission:  Medications Prior to Admission  Medication Sig Dispense Refill Last Dose   acetaminophen (TYLENOL) 500 MG tablet Take 500 mg by mouth in the morning and at bedtime.   09/20/2020   DULoxetine (CYMBALTA) 30 MG capsule Take 30 mg by mouth daily.   09/20/2020   enalapril (VASOTEC) 10 MG tablet Take 10 mg by mouth daily.   09/20/2020   insulin  glargine (LANTUS) 100 UNIT/ML injection Inject 40 Units into the skin at bedtime.   09/20/2020   JANUVIA 100 MG tablet Take 100 mg by mouth daily.  09/20/2020   Multiple Vitamin (MULTI-VITAMIN) tablet Take 1 tablet by mouth daily.   09/20/2020   olmesartan (BENICAR) 20 MG tablet Take 20 mg by mouth daily.   09/20/2020   ondansetron (ZOFRAN) 4 MG tablet Take 1 tablet (4 mg total) by mouth daily as needed for nausea or vomiting. 10 tablet 0 Past Month   metoCLOPramide (REGLAN) 10 MG tablet Take 1 tablet (10 mg total) by mouth every 8 (eight) hours as needed for nausea or vomiting. 15 tablet 0    Scheduled:  (feeding supplement) PROSource Plus  30 mL Oral BID BM   aspirin EC  81 mg Oral Daily   DULoxetine  30 mg Oral Daily   enoxaparin (LOVENOX) injection  40 mg Subcutaneous Q24H   feeding supplement (GLUCERNA SHAKE)  237 mL Oral TID BM   [START ON 09/22/2020] influenza vac split quadrivalent PF  0.5 mL Intramuscular Tomorrow-1000   insulin aspart  0-15 Units Subcutaneous Q4H   metoCLOPramide (REGLAN) injection  10 mg Intravenous TID AC   multivitamin with minerals  1 tablet Oral Daily   [START ON 09/22/2020] pneumococcal 23 valent vaccine  0.5 mL Intramuscular Tomorrow-1000   Ensure Max Protein  11 oz Oral Daily   Continuous:  cefTRIAXone (ROCEPHIN)  IV     cefTRIAXone (ROCEPHIN)  IV       ROS:                                                                                                                                       As per HPI. The patient in the context of depressed affect is reticent when asked to provide additional information.    Blood pressure (!) 148/69, pulse 88, temperature 98.6 F (37 C), temperature source Oral, resp. rate 16, height 5\' 2"  (1.575 m), weight 67.4 kg, SpO2 98 %.   General Examination:                                                                                                       Physical Exam  HEENT-  [place]/AT   Lungs- Respirations  unlabored Extremities- No edema  Neurological Examination Mental Status: Awake and alert. Speech is dysarthric but fluent. Comprehension intact. Fully oriented to location and time. Depressed affect.  Cranial Nerves: II: Temporal visual fields intact with no extinction to DSS. Right pupil 3 mm and reactive. Left pupil 2 mm and reactive.   III,IV, VI: No ptosis. EOMI with saccadic quality noted.  V: Sensation  intact to temp bilaterally VII: Right facial droop.  VIII: hearing intact to voice IX,X: No hypophonia XII: Tongue deviates slightly to the right of midline  Motor: RUE 3-4/5 proximally and distally with slow movements RLE 4/5 with slowed movements LUE 5/5 LLE 5/5 Sensory: Temp and FT intact bilaterally. No extinction to DSS.  Deep Tendon Reflexes: 2+ and symmetric throughout except for mildly diminished RUE reflexes Cerebellar: Mild ataxia with right FNF. Left FNF intact.  Gait: Deferred   Lab Results: Basic Metabolic Panel: Recent Labs  Lab 09/20/20 1210  NA 131*  K 4.2  CL 98  CO2 24  GLUCOSE 427*  BUN 21  CREATININE 0.73  CALCIUM 9.3    CBC: Recent Labs  Lab 09/20/20 1210 09/21/20 0424  WBC 8.5 7.0  HGB 14.8 13.7  HCT 42.4 38.8  MCV 86.0 86.0  PLT 296 265    Cardiac Enzymes: No results for input(s): CKTOTAL, CKMB, CKMBINDEX, TROPONINI in the last 168 hours.  Lipid Panel: Recent Labs  Lab 09/21/20 0424  CHOL 192  TRIG 203*  HDL 40*  CHOLHDL 4.8  VLDL 41*  LDLCALC 111*    Imaging: CT HEAD WO CONTRAST ( )  Result Date: 09/20/2020 CLINICAL DATA:  Neuro deficit, acute stroke suspected. EXAM: CT HEAD WITHOUT CONTRAST TECHNIQUE: Contiguous axial images were obtained from the base of the skull through the vertex without intravenous contrast. COMPARISON:  CT head 08/27/2016 BRAIN: BRAIN Cerebral ventricle sizes are concordant with the degree of cerebral volume loss. Patchy and confluent areas of decreased attenuation are noted throughout the deep  and periventricular white matter of the cerebral hemispheres bilaterally, compatible with chronic microvascular ischemic disease. No evidence of large-territorial acute infarction. No parenchymal hemorrhage. No mass lesion. No extra-axial collection. No mass effect or midline shift. No hydrocephalus. Basilar cisterns are patent. Vascular: No hyperdense vessel. Skull: No acute fracture or focal lesion. Sinuses/Orbits: Paranasal sinuses and mastoid air cells are clear. The orbits are unremarkable. Other: None. IMPRESSION: No acute intracranial abnormality. Electronically Signed   By: Tish Frederickson M.D.   On: 09/20/2020 17:51   MR ANGIO HEAD WO CONTRAST  Result Date: 09/20/2020 CLINICAL DATA:  Dizziness EXAM: MRI HEAD WITHOUT CONTRAST MRA HEAD WITHOUT CONTRAST MRA NECK WITHOUT CONTRAST TECHNIQUE: Multiplanar, multiecho pulse sequences of the brain and surrounding structures were obtained without intravenous contrast. Angiographic images of the Circle of Willis were obtained using MRA technique without intravenous contrast. Angiographic images of the neck were obtained using MRA technique without intravenous contrast. Carotid stenosis measurements (when applicable) are obtained utilizing NASCET criteria, using the distal internal carotid diameter as the denominator. COMPARISON:  None. FINDINGS: MRI HEAD FINDINGS Brain: There is a small acute infarct of the left thalamus extending into the left cerebral peduncle. No acute or chronic hemorrhage. There is multifocal hyperintense T2-weighted signal within the white matter. Parenchymal volume and CSF spaces are normal. The midline structures are normal. Vascular: Major flow voids are preserved. Skull and upper cervical spine: Normal calvarium and skull base. Visualized upper cervical spine and soft tissues are normal. Sinuses/Orbits:No paranasal sinus fluid levels or advanced mucosal thickening. No mastoid or middle ear effusion. Normal orbits. MRA HEAD FINDINGS  POSTERIOR CIRCULATION: --Vertebral arteries: Normal --Inferior cerebellar arteries: Normal. --Basilar artery: Normal. --Superior cerebellar arteries: Normal. --Posterior cerebral arteries: Mild stenosis of the midportion of the left P2 segment. Normal right. Both P comms are patent. ANTERIOR CIRCULATION: --Intracranial internal carotid arteries: Normal. --Anterior cerebral arteries (ACA): Short segment occlusion of the right ACA  A2 segment. Normal left. --Middle cerebral arteries (MCA): Normal. ANATOMIC VARIANTS: Both posterior communicating arteries are patent. MRA NECK FINDINGS Study quality is degraded by motion and lack of intravenous contrast agent. Within that limitation, the V2 and V3 segments of both vertebral arteries are patent. There is no occlusion of the distal common carotid arteries or the proximal internal carotid arteries. IMPRESSION: 1. Small acute infarct of the left thalamus extending into the left cerebral peduncle. No hemorrhage or mass effect. 2. 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 3. Mild stenosis of the midportion of the left P2 segment. 4. Motion degraded MRA of the neck without visible occlusion or high-grade stenosis. Electronically Signed   By: Deatra Robinson M.D.   On: 09/20/2020 19:45   MR ANGIO NECK WO CONTRAST  Result Date: 09/20/2020 CLINICAL DATA:  Dizziness EXAM: MRI HEAD WITHOUT CONTRAST MRA HEAD WITHOUT CONTRAST MRA NECK WITHOUT CONTRAST TECHNIQUE: Multiplanar, multiecho pulse sequences of the brain and surrounding structures were obtained without intravenous contrast. Angiographic images of the Circle of Willis were obtained using MRA technique without intravenous contrast. Angiographic images of the neck were obtained using MRA technique without intravenous contrast. Carotid stenosis measurements (when applicable) are obtained utilizing NASCET criteria, using the distal internal carotid diameter as the denominator. COMPARISON:  None. FINDINGS: MRI HEAD  FINDINGS Brain: There is a small acute infarct of the left thalamus extending into the left cerebral peduncle. No acute or chronic hemorrhage. There is multifocal hyperintense T2-weighted signal within the white matter. Parenchymal volume and CSF spaces are normal. The midline structures are normal. Vascular: Major flow voids are preserved. Skull and upper cervical spine: Normal calvarium and skull base. Visualized upper cervical spine and soft tissues are normal. Sinuses/Orbits:No paranasal sinus fluid levels or advanced mucosal thickening. No mastoid or middle ear effusion. Normal orbits. MRA HEAD FINDINGS POSTERIOR CIRCULATION: --Vertebral arteries: Normal --Inferior cerebellar arteries: Normal. --Basilar artery: Normal. --Superior cerebellar arteries: Normal. --Posterior cerebral arteries: Mild stenosis of the midportion of the left P2 segment. Normal right. Both P comms are patent. ANTERIOR CIRCULATION: --Intracranial internal carotid arteries: Normal. 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 Normal left. --Middle cerebral arteries (MCA): Normal. ANATOMIC VARIANTS: Both posterior communicating arteries are patent. MRA NECK FINDINGS Study quality is degraded by motion and lack of intravenous contrast agent. Within that limitation, the V2 and V3 segments of both vertebral arteries are patent. There is no occlusion of the distal common carotid arteries or the proximal internal carotid arteries. IMPRESSION: 1. Small acute infarct of the left thalamus extending into the left cerebral peduncle. No hemorrhage or mass effect. 2. 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 3. Mild stenosis of the midportion of the left P2 segment. 4. Motion degraded MRA of the neck without visible occlusion or high-grade stenosis. Electronically Signed   By: Deatra Robinson M.D.   On: 09/20/2020 19:45   MR BRAIN WO CONTRAST  Result Date: 09/20/2020 CLINICAL DATA:  Dizziness  EXAM: MRI HEAD WITHOUT CONTRAST MRA HEAD WITHOUT CONTRAST MRA NECK WITHOUT CONTRAST TECHNIQUE: Multiplanar, multiecho pulse sequences of the brain and surrounding structures were obtained without intravenous contrast. Angiographic images of the Circle of Willis were obtained using MRA technique without intravenous contrast. Angiographic images of the neck were obtained using MRA technique without intravenous contrast. Carotid stenosis measurements (when applicable) are obtained utilizing NASCET criteria, using the distal internal carotid diameter as the denominator. COMPARISON:  None. FINDINGS: MRI HEAD FINDINGS  Brain: There is a small acute infarct of the left thalamus extending into the left cerebral peduncle. No acute or chronic hemorrhage. There is multifocal hyperintense T2-weighted signal within the white matter. Parenchymal volume and CSF spaces are normal. The midline structures are normal. Vascular: Major flow voids are preserved. Skull and upper cervical spine: Normal calvarium and skull base. Visualized upper cervical spine and soft tissues are normal. Sinuses/Orbits:No paranasal sinus fluid levels or advanced mucosal thickening. No mastoid or middle ear effusion. Normal orbits. MRA HEAD FINDINGS POSTERIOR CIRCULATION: --Vertebral arteries: Normal --Inferior cerebellar arteries: Normal. --Basilar artery: Normal. --Superior cerebellar arteries: Normal. --Posterior cerebral arteries: Mild stenosis of the midportion of the left P2 segment. Normal right. Both P comms are patent. ANTERIOR CIRCULATION: --Intracranial internal carotid arteries: Normal. 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 Normal left. --Middle cerebral arteries (MCA): Normal. ANATOMIC VARIANTS: Both posterior communicating arteries are patent. MRA NECK FINDINGS Study quality is degraded by motion and lack of intravenous contrast agent. Within that limitation, the V2 and V3 segments of both  vertebral arteries are patent. There is no occlusion of the distal common carotid arteries or the proximal internal carotid arteries. IMPRESSION: 1. Small acute infarct of the left thalamus extending into the left cerebral peduncle. No hemorrhage or mass effect. 2. 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 3. Mild stenosis of the midportion of the left P2 segment. 4. Motion degraded MRA of the neck without visible occlusion or high-grade stenosis. Electronically Signed   By: Deatra Robinson M.D.   On: 09/20/2020 19:45   US Carotid Bilateral (at [facility] and [facility] only)  Result Date: 09/20/2020 CLINICAL DATA:  Stroke. Dizziness. Bilateral leg and white arm weakness. Syncope and diabetes. EXAM: BILATERAL CAROTID DUPLEX ULTRASOUND TECHNIQUE: Wallace Cullens scale imaging, color Doppler and duplex ultrasound were performed of bilateral carotid and vertebral arteries in the neck. COMPARISON:  None. FINDINGS: Criteria: Quantification of carotid stenosis is based on velocity parameters that correlate the residual internal carotid diameter with NASCET-based stenosis levels, using the diameter of the distal internal carotid lumen as the denominator for stenosis measurement. The following velocity measurements were obtained: RIGHT ICA: 86/28 cm/sec CCA: 68/14 cm/sec SYSTOLIC ICA/CCA RATIO:  1.3 ECA: 77 cm/sec LEFT ICA: 120/30 cm/sec CCA: 89/12 cm/sec SYSTOLIC ICA/CCA RATIO:  1.4 ECA: 58 cm/sec RIGHT CAROTID ARTERY: Mild intimal thickening without significant calcific plaque formation. Homogeneous flow demonstrated in the carotid bifurcation on color flow Doppler imaging. Normal flow velocity waveforms. RIGHT VERTEBRAL ARTERY:  Patent with antegrade flow direction. LEFT CAROTID ARTERY: Mild noncalcific plaque formation. Homogeneous flow demonstrated in the carotid bifurcation on color flow Doppler imaging. Normal flow velocity waveforms. LEFT VERTEBRAL ARTERY:  Patent with antegrade flow direction. IMPRESSION: No evidence of  hemodynamically significant stenosis of either right or the left internal carotid artery. Noncalcific plaque formation is demonstrated in the distal left common carotid artery without significant flow alteration. Electronically Signed   By: Burman Nieves M.D.   On: 09/20/2020 22:22     Assessment: 65 year old female presenting with dizziness and diffuse weakness. MRI brain performed yesterday revealed a small acute ischemic infarction involving the left thalamus and left cerebral peduncle 1. Exam reveals findings referable to the patient's left cerebral peduncle and thalamic infarct.  2. MRA head: Short segment occlusion of the right ACA A2 segment. Mild stenosis of the midportion of the left P2 segment. 3. MRA neck: Motion degraded study without visible occlusion or high-grade stenosis.  4. Carotid ultrasound: No evidence of hemodynamically significant stenosis of either right or the left internal carotid artery. Noncalcific plaque formation is demonstrated in the distal left common carotid artery without significant flow alteration. 5. Normal sinus rhythm. Left anterior fascicular block. Moderate voltage criteria for LVH, may be normal variant ( R in aVL , Cornell product ). Septal infarct , age undetermined.   Recommendations: 1. TTE with bubble study is pending. 2. Continue ASA. The patient was not on an antiplatelet medication at presentation.  3. PT/OT/Speech. 4. BP management with goal of 120/80, to be gradually achieved by 15% reduction per day. Out of the permissive HTN time window.  5. Start atorvastatin 40 mg po qd. LFTs were normal; will need baseline CK level.    Electronically signed: Dr. Caryl Pina  09/21/2020, 8:23 AM

## 2020-09-22 ENCOUNTER — Inpatient Hospital Stay
Admit: 2020-09-22 | Discharge: 2020-09-22 | Disposition: A | Discharge status: Home / Self Care | Payer: BLUE CROSS/BLUE SHIELD | Attending: Internal Medicine | Admitting: Internal Medicine

## 2020-09-22 DIAGNOSIS — E1169 Type 2 diabetes mellitus with other specified complication: Secondary | ICD-10-CM | POA: Diagnosis not present

## 2020-09-22 DIAGNOSIS — I639 Cerebral infarction, unspecified: Secondary | ICD-10-CM | POA: Diagnosis not present

## 2020-09-22 DIAGNOSIS — N3000 Acute cystitis without hematuria: Secondary | ICD-10-CM | POA: Diagnosis not present

## 2020-09-22 DIAGNOSIS — I10 Essential (primary) hypertension: Secondary | ICD-10-CM | POA: Diagnosis not present

## 2020-09-22 LAB — GLUCOSE, CAPILLARY
Glucose-Capillary: 231 mg/dL — ABNORMAL HIGH (ref 70–99)
Glucose-Capillary: 127 mg/dL — ABNORMAL HIGH (ref 70–99)
Glucose-Capillary: 113 mg/dL — ABNORMAL HIGH (ref 70–99)
Glucose-Capillary: 258 mg/dL — ABNORMAL HIGH (ref 70–99)
Glucose-Capillary: 129 mg/dL — ABNORMAL HIGH (ref 70–99)
Glucose-Capillary: 263 mg/dL — ABNORMAL HIGH (ref 70–99)
Glucose-Capillary: 216 mg/dL — ABNORMAL HIGH (ref 70–99)

## 2020-09-22 LAB — CBC
HCT: 40.6 % (ref 36.0–46.0)
Hemoglobin: 14.1 g/dL (ref 12.0–15.0)
MCH: 29.8 pg (ref 26.0–34.0)
MCHC: 34.7 g/dL (ref 30.0–36.0)
MCV: 85.8 fL (ref 80.0–100.0)
Platelets: 265 10*3/uL (ref 150–400)
RBC: 4.73 MIL/uL (ref 3.87–5.11)
RDW: 11.8 % (ref 11.5–15.5)
WBC: 6.4 10*3/uL (ref 4.0–10.5)
nRBC: 0 % (ref 0.0–0.2)

## 2020-09-22 LAB — COMPREHENSIVE METABOLIC PANEL
ALT: 17 U/L (ref 0–44)
AST: 20 U/L (ref 15–41)
Albumin: 3.4 g/dL — ABNORMAL LOW (ref 3.5–5.0)
Alkaline Phosphatase: 101 U/L (ref 38–126)
Anion gap: 6 (ref 5–15)
BUN: 12 mg/dL (ref 8–23)
CO2: 27 mmol/L (ref 22–32)
Calcium: 9.3 mg/dL (ref 8.9–10.3)
Chloride: 104 mmol/L (ref 98–111)
Creatinine, Ser: 0.56 mg/dL (ref 0.44–1.00)
GFR, Estimated: >60 mL/min (ref >60)
Glucose, Bld: 131 mg/dL — ABNORMAL HIGH (ref 70–99)
Potassium: 3.6 mmol/L (ref 3.5–5.1)
Sodium: 137 mmol/L (ref 135–145)
Total Bilirubin: 0.6 mg/dL (ref 0.3–1.2)
Total Protein: 6.2 g/dL — ABNORMAL LOW (ref 6.5–8.1)

## 2020-09-22 LAB — URINE CULTURE: Culture: >=100000 — AB

## 2020-09-22 LAB — ECHOCARDIOGRAM COMPLETE
Height: 62 in
Weight: 2377.44 oz

## 2020-09-22 MED ORDER — INSULIN GLARGINE-YFGN 100 UNIT/ML ~~LOC~~ SOLN
7.0000 [IU] | Freq: Every day | SUBCUTANEOUS | Status: DC
  Administered 2020-09-22 – 2020-09-23 (×2): 7 [IU] via SUBCUTANEOUS
  Filled 2020-09-22 (×3): qty 0.07

## 2020-09-22 MED ORDER — CEPHALEXIN 500 MG PO CAPS
500.0000 mg | ORAL_CAPSULE | Freq: Two times a day (BID) | ORAL | Status: AC
  Administered 2020-09-23 (×2): 500 mg via ORAL
  Filled 2020-09-22 (×2): qty 1

## 2020-09-22 MED ORDER — IRBESARTAN 150 MG PO TABS
150.0000 mg | ORAL_TABLET | Freq: Every day | ORAL | Status: DC
  Administered 2020-09-23 – 2020-10-01 (×9): 150 mg via ORAL
  Filled 2020-09-22 (×9): qty 1

## 2020-09-22 NOTE — Progress Notes (Signed)
Occupational Therapy Treatment Patient Details Name: Lacey Gonzalez MRN: [id number] DOB: 20-Nov-1955 Today's Date: 09/22/2020   History of present illness Lacey Gonzalez is a 65 y.o. female admitted with reports of intermittent dizziness, weakness R>L side, R facial droop. 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 No hemorrhage or mass effect, Short segment occlusion of the right ACA A2 segment, Mild stenosis of the midportion of the left P2 segment, Motion degraded MRA of the neck without visible occlusion or high-grade stenosis.   OT comments  Pt seen for OT treatment on this date. Upon arrival to room, pt awake and sitting upright in bed. Pt agreeable to OT tx and requesting to work on washing hair/peri-care this date. Pt continues to present with R-sided weakness, decreased balance, and decreased coordination. Due to these functional impairments, pt requires MIN GUARD for bed mobility (with HOB elevated), MOD A for sit<>stand transfers, MOD A for sit>stand peri-care, and MIN A for functional mobility of short household distances (~23ft) with RW. Once standing sink-side, pt able to perform standing grooming tasks and wash hair with shower cap, requiring verbal cues for engaging RUE in grooming tasks via HHA from LUE and intermittent MOD A in setting of R lateral lean. Pt making good progress toward goals and continues to benefit from skilled OT services to maximize return to PLOF and minimize risk of future falls, injury, caregiver burden, and readmission. Will continue to follow POC. Discharge recommendation remains appropriate.     Recommendations for follow up therapy are one component of a multi-disciplinary discharge planning process, led by the  attending physician.  Recommendations may be updated based on patient status, additional functional criteria and insurance authorization.    Follow Up Recommendations  CIR    Equipment Recommendations  Other (comment) (per next venue of care)       Precautions / Restrictions Precautions Precautions: Fall Restrictions Weight Bearing Restrictions: No       Mobility Bed Mobility Overal bed mobility: Needs Assistance Bed Mobility: Supine to Sit     Supine to sit: Min guard;HOB elevated          Transfers Overall transfer level: Needs assistance Equipment used: Rolling walker (2 wheeled) Transfers: Sit to/from Stand Sit to Stand: Mod assist         General transfer comment: MOD A for upward momentum and standing balance    Balance Overall balance assessment: Needs assistance Sitting-balance support: No upper extremity supported;Feet supported   Sitting balance - Comments: Good static sitting balance at EOB   Standing balance support: No upper extremity supported;During functional activity Standing balance-Leahy Scale: Poor Standing balance comment: Once upright, pt able to maintain static standing balance with MIN GUARD + BUE support from RW. Requires intermittent MOD A during standing bimanual grooming tasks in setting of R lateral lean with dynamic standing                           ADL either performed or assessed with clinical judgement   ADL Overall ADL's : Needs assistance/impaired     Grooming: Wash/dry hands;Wash/dry face;Brushing hair;Moderate assistance;Standing Grooming Details (indicate cue type and reason): Pt able to perform standing grooming tasks and wash hair with shower cap, with verbal cues for engaging RUE in  grooming tasks via HHA from LUE and intermittent MOD A in setting of R lateral lean.                     Toileting- Clothing Manipulation and Hygiene: Moderate assistance;Sit to/from stand Toileting - Clothing  Manipulation Details (indicate cue type and reason): MOD A for sit>stand peri-care with unilateral UE support from RW     Functional mobility during ADLs: Minimal assistance;Rolling walker (to walk ~11ft)        Cognition Arousal/Alertness: Awake/alert Behavior During Therapy: WFL for tasks assessed/performed Overall Cognitive Status: Within Functional Limits for tasks assessed                                                General Comments HR 120s during standing grooming tasks. At end of session, pt left in recliner with HR 95    Pertinent Vitals/ Pain       Pain Assessment: No/denies pain  Home Living Family/patient expects to be discharged to:: Private residence Living Arrangements: Alone (pt reports she has significant other who can assist, do not live together) Available Help at Discharge: Friend(s);Available 24 hours/day (significant other) Type of Home: House Home Access: Stairs to enter [employer] of Steps: per pt report 4-6 Entrance Stairs-Rails: Right;Left;Can reach both Home Layout: Two level Alternate Level Stairs-Number of Steps: flight of stairs- pt reports she prefers to sleep on the first floor on the couch             Home Equipment: Walker - 2 wheels;Cane - single point          Prior Functioning/Environment Level of Independence: Independent with assistive device(s)        Comments: pt reports she has walker and cane, does not use often   Frequency  Min 3X/week        Progress Toward Goals  OT Goals(current goals can now be found in the care plan section)  Progress towards OT goals: Progressing toward goals  Acute Rehab OT Goals Patient Stated Goal: to get better OT Goal Formulation: With patient Time For Goal Achievement: 10/05/20 Potential to Achieve Goals: Good  Plan Discharge plan remains appropriate;Frequency remains appropriate       AM-PAC OT "6 Clicks" Daily Activity     Outcome Measure    Help from another person eating meals?: A Little Help from another person taking care of personal grooming?: A Little Help from another person toileting, which includes using toliet, bedpan, or urinal?: A Lot Help from another person bathing (including washing, rinsing, drying)?: A Lot Help from another person to put on and taking off regular upper body clothing?: A Little Help from another person to put on and taking off regular lower body clothing?: A Lot 6 Click Score: 15    End of Session Equipment Utilized During Treatment: Gait belt;Rolling walker  OT Visit Diagnosis: Unsteadiness on feet (R26.81);Muscle weakness (generalized) (M62.81);History of falling (Z91.81);Other abnormalities of gait and mobility (R26.89);Hemiplegia and hemiparesis Hemiplegia - Right/Left: Right Hemiplegia - dominant/non-dominant: Dominant   Activity Tolerance Patient tolerated treatment well   Patient Left in chair;with call bell/phone within reach;with chair alarm set   Nurse Communication Mobility status        Time: 1610-9604 OT Time Calculation (min): 39 min  Charges: OT General Charges $OT Visit: 1 Visit OT Treatments $Self Care/Home  Management : 38-52 mins  Matthew Folks, OTR/L [facility] [phone]

## 2020-09-22 NOTE — TOC Initial Note (Signed)
Transition of Care [facility]) - Initial/Assessment Note    Patient Details  Name: Lacey Gonzalez MRN: [id number] Date of Birth: November 21, 1955  [facility] CM/SW Contact:    Shelbie Hutching, RN Phone Number: 09/22/2020, 3:36 PM  Clinical Narrative:                 Patient admitted to the hospital with stroke.  RNCM met with patient at the bedside this morning.  Patient reports that she lives at home alone in [place].  Patient is retired, independent and drives.  Patient is current with her PCP and gets prescriptions from [facility].   Patient agrees to CIR as recommended by PT and OT.  Patient does not want to go to a nursing home for rehab.   Patient reports that when she does go home her significant other will be able to help her out.    Expected Discharge Plan: IP Rehab Facility Barriers to Discharge: Continued Medical Work up   Patient Goals and CMS Choice Patient states their goals for this hospitalization and ongoing recovery are:: Patient wants to get better and get home CMS Medicare.gov Compare Post Acute Care list provided to:: Patient Choice offered to / list presented to : Patient  Expected Discharge Plan and Services Expected Discharge Plan: [place]   Discharge Planning Services: CM Consult Post Acute Care Choice: IP Rehab Living arrangements for the past 2 months: Single Family Home                 DME Arranged: N/A DME Agency: NA       HH Arranged: NA HH Agency: NA        Prior Living Arrangements/Services Living arrangements for the past 2 months: Single Family Home Lives with:: Self Patient language and need for interpreter reviewed:: Yes Do you feel safe going back to the place where you live?: Yes      Need for Family Participation in Patient Care: Yes (Comment) (stroke) Care giver support system in place?: Yes (comment) (significant other)   Criminal Activity/Legal Involvement Pertinent to Current Situation/Hospitalization: No -  Comment as needed  Activities of Daily Living Home Assistive Devices/Equipment: None ADL Screening (condition at time of admission) Patient's cognitive ability adequate to safely complete daily activities?: Yes Is the patient deaf or have difficulty hearing?: No Does the patient have difficulty seeing, even when wearing glasses/contacts?: Yes Does the patient have difficulty concentrating, remembering, or making decisions?: No Patient able to express need for assistance with ADLs?: Yes Does the patient have difficulty dressing or bathing?: No Independently performs ADLs?: Yes (appropriate for developmental age) Does the patient have difficulty walking or climbing stairs?: No Weakness of Legs: Both Weakness of Arms/Hands: Right  Permission Sought/Granted Permission sought to share information with : Case Manager, Family Supports, Chartered certified accountant granted to share information with : Yes, Verbal Permission Granted  Share Information with NAME: Braulio Bosch  Permission granted to share info w AGENCY: CIR  Permission granted to share info w Relationship: significant other     Emotional Assessment Appearance:: Appears older than stated age Attitude/Demeanor/Rapport: Engaged Affect (typically observed): Accepting Orientation: : Oriented to Self, Oriented to Place, Oriented to  Time, Oriented to Situation Alcohol / Substance Use: Not Applicable Psych Involvement: No (comment)  Admission diagnosis:  Dizziness [R42] Weakness [R53.1] Hyperglycemia [R73.9] CVA (cerebral vascular accident) ([place]) [I63.9] Acute cystitis without hematuria [N30.00] Patient Active Problem List   Diagnosis Date Noted   Malnutrition  of moderate degree 09/21/2020   CVA (cerebral vascular accident) ([place]) 09/20/2020   Acute lower UTI 09/20/2020   Anxiety and depression 07/03/2020   Primary hypertension 07/03/2020   DM2 (diabetes mellitus, type 2) ([place]) 04/13/2017   PCP:  Langley Gauss  Primary Care Pharmacy:   [facility], [address] [place] [address] Phone: [phone] Fax: [phone]     Social Determinants of Health (SDOH) Interventions    Readmission Risk Interventions No flowsheet data found.

## 2020-09-22 NOTE — Consult Note (Signed)
Physical Medicine and Rehabilitation Consult Reason for Consult: CVA Referring Physician: Erick Blinks, MD   HPI: Lacey Gonzalez is a 65 y.o. female with PMH of type 2 DM on Januvia and Lantus, gallstone, DDD, HTN on Vasotec and recently started Benicar, anxiety and depression on Cymbalta, who was admitted with intermittent dizziness, right>left sided weakness, right sided facial droop. 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 There was no hemorrhage or mass effect. There was short segment occlusion of the right ACA A2 segment, mild stenosis of the left P2 segment, and motion degraded MRA of the neck without visible occlusion or high grade stenosis.   Review of Systems  Constitutional:  Positive for malaise/fatigue.  HENT: Negative.    Eyes: Negative.   Respiratory: Negative.    Cardiovascular: Negative.   Gastrointestinal: Negative.   Genitourinary: Negative.   Musculoskeletal:  Positive for back pain.  Skin: Negative.   Neurological:  Positive for weakness.  Endo/Heme/Allergies: Negative.   Psychiatric/Behavioral:  Positive for depression. The patient is nervous/anxious.   Past Medical History:  Diagnosis Date   DDD (degenerative disc disease), lumbar    Diabetes mellitus without complication (HCC)    Gallstones    History reviewed. No pertinent surgical history. History reviewed. No pertinent family history. Social History:  reports that she has never smoked. She has never used smokeless tobacco. She reports current alcohol use. She reports that she does not use drugs. Allergies: No Known Allergies Medications Prior to Admission  Medication Sig Dispense Refill   acetaminophen (TYLENOL) 500 MG tablet Take 500 mg by mouth in the morning and at bedtime.     DULoxetine (CYMBALTA) 30 MG capsule Take 30 mg by mouth daily.     enalapril (VASOTEC) 10 MG tablet Take 10 mg by mouth daily.     insulin glargine (LANTUS) 100 UNIT/ML  injection Inject 40 Units into the skin at bedtime.     JANUVIA 100 MG tablet Take 100 mg by mouth daily.     Multiple Vitamin (MULTI-VITAMIN) tablet Take 1 tablet by mouth daily.     olmesartan (BENICAR) 20 MG tablet Take 20 mg by mouth daily.     ondansetron (ZOFRAN) 4 MG tablet Take 1 tablet (4 mg total) by mouth daily as needed for nausea or vomiting. 10 tablet 0   metoCLOPramide (REGLAN) 10 MG tablet Take 1 tablet (10 mg total) by mouth every 8 (eight) hours as needed for nausea or vomiting. 15 tablet 0    Home: Home Living Family/patient expects to be discharged to:: Private residence Living Arrangements: Alone (pt reports she has significant other who can assist, do not live together) Available Help at Discharge: Friend(s), Available 24 hours/day (significant other) Type of Home: House Home Access: Stairs to enter [employer] of Steps: per pt report 4-6 Entrance Stairs-Rails: Right, Left, Can reach both Home Layout: Two level Alternate Level Stairs-Number of Steps: flight of stairs- pt reports she prefers to sleep on the first floor on the couch Home Equipment: Walker - 2 wheels, [employer] - single point  Lives With: Significant other  Functional History: Prior Function Level of Independence: Independent with assistive device(s) Comments: pt reports she has walker and cane, does not use often Functional Status:  Mobility: Bed Mobility Overal bed mobility: Needs Assistance Bed Mobility: Supine to Sit Rolling: Min assist Supine to sit: Min guard, HOB elevated Sit to supine: Mod assist General bed mobility comments: pt in recliner  upon arrival to the room. Transfers Overall transfer level: Needs assistance Equipment used: Rolling walker (2 wheeled) Transfers: Sit to/from Stand Sit to Stand: Min guard General transfer comment: MOD A for upward momentum and standing balance Ambulation/Gait Ambulation/Gait assistance: Min guard, Min assist, Mod assist Gait Distance  (Feet): 26 Feet Assistive device: Rolling walker (2 wheeled) Gait Pattern/deviations: Step-to pattern, Decreased step length - right General Gait Details: Pt has difficulty lifting the R LE for foot clearance and tends to lateral lean to the R side as well.  Pt is unstbale in walking and requires min-modA for remaining upright due to imbalance to the R. Gait velocity: decreased    ADL: ADL Overall ADL's : Needs assistance/impaired Grooming: Wash/dry hands, Wash/dry face, Brushing hair, Moderate assistance, Standing Grooming Details (indicate cue type and reason): Pt able to perform standing grooming tasks and wash hair with shower cap, with verbal cues for engaging RUE in grooming tasks via HHA from LUE and intermittent MOD A in setting of R lateral lean. Upper Body Dressing : Minimal assistance, Cueing for sequencing Upper Body Dressing Details (indicate cue type and reason): vcs for hemi dressing technique Lower Body Dressing: Moderate assistance Lower Body Dressing Details (indicate cue type and reason): doffing skirt (worn upon admission) Toileting- Clothing Manipulation and Hygiene: Moderate assistance, Sit to/from stand Toileting - Clothing Manipulation Details (indicate cue type and reason): MOD A for sit>stand peri-care with unilateral UE support from RW Functional mobility during ADLs: Minimal assistance, Rolling walker (to walk ~56ft) General ADL Comments: for STS for ADL participation  Cognition: Cognition Overall Cognitive Status: Within Functional Limits for tasks assessed Arousal/Alertness: Awake/alert Orientation Level: Oriented X4 Year: 2022 Month: September Attention: Focused, Sustained Focused Attention: Appears intact Sustained Attention: Appears intact (adequate) Memory: Impaired Memory Impairment: Decreased recall of new information Immediate Memory Recall: Sock, Bed Memory Recall Blue: With Cue Awareness: Impaired Awareness Impairment: Anticipatory  impairment Problem Solving: Impaired Executive Function: Reasoning, Organizing, Decision Making (less prompt follow through w/ situations) Behaviors: Poor frustration tolerance ("well, i know that") Safety/Judgment: Impaired Cognition Arousal/Alertness: Awake/alert Behavior During Therapy: WFL for tasks assessed/performed Overall Cognitive Status: Within Functional Limits for tasks assessed General Comments: Pt is alert and oriented, however affect appears flat  Blood pressure (!) 153/72, pulse 91, temperature 98.4 F (36.9 C), temperature source Oral, resp. rate 16, height  (1.575 m), weight 67.4 kg, SpO2 96 %. Physical Exam Gen: no distress, normal appearing HEENT: oral mucosa pink and moist, NCAT Cardio: Reg rate Chest: normal effort, normal rate of breathing Abd: soft, non-distended Ext: no edema Psych: pleasant, normal affect Skin: intact Neuro: Alert and oriented x3.  Musculoskeletal: 5/5 on left side, 4/5 right side. Right sided facial droop. +right sided dysmetria  Results for orders placed or performed during the hospital encounter of 09/20/20 (from the past 24 hour(s))  Glucose, capillary     Status: Abnormal   Collection Time: 09/21/20  4:17 PM  Result Value Ref Range   Glucose-Capillary 170 (H) 70 - 99 mg/dL   Comment 1 Notify RN    Comment 2 Document in Chart   Glucose, capillary     Status: Abnormal   Collection Time: 09/21/20  7:58 PM  Result Value Ref Range   Glucose-Capillary 191 (H) 70 - 99 mg/dL  Glucose, capillary     Status: Abnormal   Collection Time: 09/22/20  1:15 AM  Result Value Ref Range   Glucose-Capillary 231 (H) 70 - 99 mg/dL  CBC  Status: None   Collection Time: 09/22/20  4:25 AM  Result Value Ref Range   WBC 6.4 4.0 - 10.5 K/uL   RBC 4.73 3.87 - 5.11 MIL/uL   Hemoglobin 14.1 12.0 - 15.0 g/dL   HCT 59.5 63.8 - 75.6 %   MCV 85.8 80.0 - 100.0 fL   MCH 29.8 26.0 - 34.0 pg   MCHC 34.7 30.0 - 36.0 g/dL   RDW 43.3 29.5 - 18.8 %    Platelets 265 150 - 400 K/uL   nRBC 0.0 0.0 - 0.2 %  Comprehensive metabolic panel     Status: Abnormal   Collection Time: 09/22/20  4:25 AM  Result Value Ref Range   Sodium 137 135 - 145 mmol/L   Potassium 3.6 3.5 - 5.1 mmol/L   Chloride 104 98 - 111 mmol/L   CO2 27 22 - 32 mmol/L   Glucose, Bld 131 (H) 70 - 99 mg/dL   BUN 12 8 - 23 mg/dL   Creatinine, Ser 4.16 0.44 - 1.00 mg/dL   Calcium 9.3 8.9 - 60.6 mg/dL   Total Protein 6.2 (L) 6.5 - 8.1 g/dL   Albumin 3.4 (L) 3.5 - 5.0 g/dL   AST 20 15 - 41 U/L   ALT 17 0 - 44 U/L   Alkaline Phosphatase 101 38 - 126 U/L   Total Bilirubin 0.6 0.3 - 1.2 mg/dL   GFR, Estimated >30 >16 mL/min   Anion gap 6 5 - 15  Glucose, capillary     Status: Abnormal   Collection Time: 09/22/20  4:26 AM  Result Value Ref Range   Glucose-Capillary 127 (H) 70 - 99 mg/dL  Glucose, capillary     Status: Abnormal   Collection Time: 09/22/20  8:36 AM  Result Value Ref Range   Glucose-Capillary 113 (H) 70 - 99 mg/dL  Glucose, capillary     Status: Abnormal   Collection Time: 09/22/20 11:06 AM  Result Value Ref Range   Glucose-Capillary 258 (H) 70 - 99 mg/dL   CT HEAD WO CONTRAST ( )  Result Date: 09/20/2020 CLINICAL DATA:  Neuro deficit, acute stroke suspected. EXAM: CT HEAD WITHOUT CONTRAST TECHNIQUE: Contiguous axial images were obtained from the base of the skull through the vertex without intravenous contrast. COMPARISON:  CT head 08/27/2016 BRAIN: BRAIN Cerebral ventricle sizes are concordant with the degree of cerebral volume loss. Patchy and confluent areas of decreased attenuation are noted throughout the deep and periventricular white matter of the cerebral hemispheres bilaterally, compatible with chronic microvascular ischemic disease. No evidence of large-territorial acute infarction. No parenchymal hemorrhage. No mass lesion. No extra-axial collection. No mass effect or midline shift. No hydrocephalus. Basilar cisterns are patent. Vascular: No  hyperdense vessel. Skull: No acute fracture or focal lesion. Sinuses/Orbits: Paranasal sinuses and mastoid air cells are clear. The orbits are unremarkable. Other: None. IMPRESSION: No acute intracranial abnormality. Electronically Signed   By: Tish Frederickson M.D.   On: 09/20/2020 17:51   MR ANGIO HEAD WO CONTRAST  Result Date: 09/20/2020 CLINICAL DATA:  Dizziness EXAM: MRI HEAD WITHOUT CONTRAST MRA HEAD WITHOUT CONTRAST MRA NECK WITHOUT CONTRAST TECHNIQUE: Multiplanar, multiecho pulse sequences of the brain and surrounding structures were obtained without intravenous contrast. Angiographic images of the Circle of Willis were obtained using MRA technique without intravenous contrast. Angiographic images of the neck were obtained using MRA technique without intravenous contrast. Carotid stenosis measurements (when applicable) are obtained utilizing NASCET criteria, using the distal internal carotid diameter as the denominator. COMPARISON:  None. FINDINGS: MRI HEAD FINDINGS Brain: There is a small acute infarct of the left thalamus extending into the left cerebral peduncle. No acute or chronic hemorrhage. There is multifocal hyperintense T2-weighted signal within the white matter. Parenchymal volume and CSF spaces are normal. The midline structures are normal. Vascular: Major flow voids are preserved. Skull and upper cervical spine: Normal calvarium and skull base. Visualized upper cervical spine and soft tissues are normal. Sinuses/Orbits:No paranasal sinus fluid levels or advanced mucosal thickening. No mastoid or middle ear effusion. Normal orbits. MRA HEAD FINDINGS POSTERIOR CIRCULATION: --Vertebral arteries: Normal --Inferior cerebellar arteries: Normal. --Basilar artery: Normal. --Superior cerebellar arteries: Normal. --Posterior cerebral arteries: Mild stenosis of the midportion of the left P2 segment. Normal right. Both P comms are patent. ANTERIOR CIRCULATION: --Intracranial internal carotid arteries:  Normal. 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 Normal left. --Middle cerebral arteries (MCA): Normal. ANATOMIC VARIANTS: Both posterior communicating arteries are patent. MRA NECK FINDINGS Study quality is degraded by motion and lack of intravenous contrast agent. Within that limitation, the V2 and V3 segments of both vertebral arteries are patent. There is no occlusion of the distal common carotid arteries or the proximal internal carotid arteries. IMPRESSION: 1. Small acute infarct of the left thalamus extending into the left cerebral peduncle. No hemorrhage or mass effect. 2. 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 3. Mild stenosis of the midportion of the left P2 segment. 4. Motion degraded MRA of the neck without visible occlusion or high-grade stenosis. Electronically Signed   By: Deatra Robinson M.D.   On: 09/20/2020 19:45   MR ANGIO NECK WO CONTRAST  Result Date: 09/20/2020 CLINICAL DATA:  Dizziness EXAM: MRI HEAD WITHOUT CONTRAST MRA HEAD WITHOUT CONTRAST MRA NECK WITHOUT CONTRAST TECHNIQUE: Multiplanar, multiecho pulse sequences of the brain and surrounding structures were obtained without intravenous contrast. Angiographic images of the Circle of Willis were obtained using MRA technique without intravenous contrast. Angiographic images of the neck were obtained using MRA technique without intravenous contrast. Carotid stenosis measurements (when applicable) are obtained utilizing NASCET criteria, using the distal internal carotid diameter as the denominator. COMPARISON:  None. FINDINGS: MRI HEAD FINDINGS Brain: There is a small acute infarct of the left thalamus extending into the left cerebral peduncle. No acute or chronic hemorrhage. There is multifocal hyperintense T2-weighted signal within the white matter. Parenchymal volume and CSF spaces are normal. The midline structures are normal. Vascular: Major flow voids are preserved. Skull and  upper cervical spine: Normal calvarium and skull base. Visualized upper cervical spine and soft tissues are normal. Sinuses/Orbits:No paranasal sinus fluid levels or advanced mucosal thickening. No mastoid or middle ear effusion. Normal orbits. MRA HEAD FINDINGS POSTERIOR CIRCULATION: --Vertebral arteries: Normal --Inferior cerebellar arteries: Normal. --Basilar artery: Normal. --Superior cerebellar arteries: Normal. --Posterior cerebral arteries: Mild stenosis of the midportion of the left P2 segment. Normal right. Both P comms are patent. ANTERIOR CIRCULATION: --Intracranial internal carotid arteries: Normal. 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 Normal left. --Middle cerebral arteries (MCA): Normal. ANATOMIC VARIANTS: Both posterior communicating arteries are patent. MRA NECK FINDINGS Study quality is degraded by motion and lack of intravenous contrast agent. Within that limitation, the V2 and V3 segments of both vertebral arteries are patent. There is no occlusion of the distal common carotid arteries or the proximal internal carotid arteries. IMPRESSION: 1. Small acute infarct of the left thalamus extending into the left cerebral peduncle. No hemorrhage  or mass effect. 2. 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 3. Mild stenosis of the midportion of the left P2 segment. 4. Motion degraded MRA of the neck without visible occlusion or high-grade stenosis. Electronically Signed   By: Deatra Robinson M.D.   On: 09/20/2020 19:45   MR BRAIN WO CONTRAST  Result Date: 09/20/2020 CLINICAL DATA:  Dizziness EXAM: MRI HEAD WITHOUT CONTRAST MRA HEAD WITHOUT CONTRAST MRA NECK WITHOUT CONTRAST TECHNIQUE: Multiplanar, multiecho pulse sequences of the brain and surrounding structures were obtained without intravenous contrast. Angiographic images of the Circle of Willis were obtained using MRA technique without intravenous contrast. Angiographic images of the neck  were obtained using MRA technique without intravenous contrast. Carotid stenosis measurements (when applicable) are obtained utilizing NASCET criteria, using the distal internal carotid diameter as the denominator. COMPARISON:  None. FINDINGS: MRI HEAD FINDINGS Brain: There is a small acute infarct of the left thalamus extending into the left cerebral peduncle. No acute or chronic hemorrhage. There is multifocal hyperintense T2-weighted signal within the white matter. Parenchymal volume and CSF spaces are normal. The midline structures are normal. Vascular: Major flow voids are preserved. Skull and upper cervical spine: Normal calvarium and skull base. Visualized upper cervical spine and soft tissues are normal. Sinuses/Orbits:No paranasal sinus fluid levels or advanced mucosal thickening. No mastoid or middle ear effusion. Normal orbits. MRA HEAD FINDINGS POSTERIOR CIRCULATION: --Vertebral arteries: Normal --Inferior cerebellar arteries: Normal. --Basilar artery: Normal. --Superior cerebellar arteries: Normal. --Posterior cerebral arteries: Mild stenosis of the midportion of the left P2 segment. Normal right. Both P comms are patent. ANTERIOR CIRCULATION: --Intracranial internal carotid arteries: Normal. 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 Normal left. --Middle cerebral arteries (MCA): Normal. ANATOMIC VARIANTS: Both posterior communicating arteries are patent. MRA NECK FINDINGS Study quality is degraded by motion and lack of intravenous contrast agent. Within that limitation, the V2 and V3 segments of both vertebral arteries are patent. There is no occlusion of the distal common carotid arteries or the proximal internal carotid arteries. IMPRESSION: 1. Small acute infarct of the left thalamus extending into the left cerebral peduncle. No hemorrhage or mass effect. 2. 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 3. Mild stenosis of the midportion of the  left P2 segment. 4. Motion degraded MRA of the neck without visible occlusion or high-grade stenosis. Electronically Signed   By: Deatra Robinson M.D.   On: 09/20/2020 19:45   US Carotid Bilateral (at [facility] and [facility] only)  Result Date: 09/20/2020 CLINICAL DATA:  Stroke. Dizziness. Bilateral leg and white arm weakness. Syncope and diabetes. EXAM: BILATERAL CAROTID DUPLEX ULTRASOUND TECHNIQUE: Wallace Cullens scale imaging, color Doppler and duplex ultrasound were performed of bilateral carotid and vertebral arteries in the neck. COMPARISON:  None. FINDINGS: Criteria: Quantification of carotid stenosis is based on velocity parameters that correlate the residual internal carotid diameter with NASCET-based stenosis levels, using the diameter of the distal internal carotid lumen as the denominator for stenosis measurement. The following velocity measurements were obtained: RIGHT ICA: 86/28 cm/sec CCA: 68/14 cm/sec SYSTOLIC ICA/CCA RATIO:  1.3 ECA: 77 cm/sec LEFT ICA: 120/30 cm/sec CCA: 89/12 cm/sec SYSTOLIC ICA/CCA RATIO:  1.4 ECA: 58 cm/sec RIGHT CAROTID ARTERY: Mild intimal thickening without significant calcific plaque formation. Homogeneous flow demonstrated in the carotid bifurcation on color flow Doppler imaging. Normal flow velocity waveforms. RIGHT VERTEBRAL ARTERY:  Patent with antegrade flow direction. LEFT CAROTID ARTERY: Mild noncalcific plaque formation. Homogeneous flow demonstrated in the  carotid bifurcation on color flow Doppler imaging. Normal flow velocity waveforms. LEFT VERTEBRAL ARTERY:  Patent with antegrade flow direction. IMPRESSION: No evidence of hemodynamically significant stenosis of either right or the left internal carotid artery. Noncalcific plaque formation is demonstrated in the distal left common carotid artery without significant flow alteration. Electronically Signed   By: Burman Nieves M.D.   On: 09/20/2020 22:22     Assessment/Plan: Diagnosis: Left thalamic infarct Does the need for  close, 24 hr/day medical supervision in concert with the patient's rehab needs make it unreasonable for this patient to be served in a less intensive setting? Yes Co-Morbidities requiring supervision/potential complications:  HTN: monitor BP TID Right sided weakness: will benefit from intense PT/OT Anxiety: continue Cymbalta Depression Overweight BMRI 27.18: provide dietary counseling.  Due to bladder management, bowel management, safety, skin/wound care, disease management, medication administration, pain management, and patient education, does the patient require 24 hr/day rehab nursing? Yes Does the patient require coordinated care of a physician, rehab nurse, therapy disciplines of PT, OT to address physical and functional deficits in the context of the above medical diagnosis(es)? Yes Addressing deficits in the following areas: balance, endurance, locomotion, strength, transferring, bowel/bladder control, bathing, dressing, feeding, grooming, toileting, cognition, and psychosocial support Can the patient actively participate in an intensive therapy program of at least 3 hrs of therapy per day at least 5 days per week? Yes The potential for patient to make measurable gains while on inpatient rehab is excellent Anticipated functional outcomes upon discharge from inpatient rehab are modified independent  with PT, modified independent with OT, independent with SLP. Estimated rehab length of stay to reach the above functional goals is: 7-10 days Anticipated discharge destination: Home Overall Rehab/Functional Prognosis: excellent  RECOMMENDATIONS: This patient's condition is appropriate for continued rehabilitative care in the following setting: CIR Patient has agreed to participate in recommended program. Yes Note that insurance prior authorization may be required for reimbursement for recommended care.  Comment: Thank you for this consult. Admission coordinator to follow.   Sula Soda,  MD

## 2020-09-22 NOTE — Evaluation (Signed)
Physical Therapy Evaluation Patient Details Name: Lacey Gonzalez MRN: [id number] DOB: [birth year] Today's Date: 09/22/2020  History of Present Illness  Lacey Gonzalez is a 65 y.o. female admitted with reports of intermittent dizziness, weakness R>L side, R facial droop. 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 No hemorrhage or mass effect, Short segment occlusion of the right ACA A2 segment, Mild stenosis of the midportion of the left P2 segment, Motion degraded MRA of the neck without visible occlusion or high-grade stenosis.    Clinical Impression  Pt received seated in recliner upon arrival to room.  Pt agreeable to therapy.  Pt notes she is still fatigued, but would like to attempt to ambulate today.  Pt with decreased strength specifically with R hip flexion and noticeable weakness throughout bilateral LE's.  Pt able to come upright into standing with modA from therapist for support, along with verbal cues along the way for hand placement.  Pt has difficult with standing balance noting an increased lateral lean to the R.  Pt is able to correct, but has to be given verbal cuing for her to notice at this point.  Pt states she is still noting dizziness upon standing as well.  Pt ambulated to doorway, with 3 incidents of instability that was corrected by therapist.  Pt then transferred back to recliner where pt was left to rest.  Pt given all needs and call bell was placed within reach.  Pt will benefit from skilled PT intervention to increase independence and safety with basic mobility in preparation for discharge to the venue listed below.    Due to pt reported prior level of function, recommendation is to CIR in order to improve mobility and increase  QoL.  Pt notes she was independent with all tasks and rarely used any AD.  Pt to benefit from skilled therapy to address current deficits at CIR location.      Recommendations for follow up therapy are one component of a multi-disciplinary discharge planning process, led by the attending physician.  Recommendations may be updated based on patient status, additional functional criteria and insurance authorization.  Follow Up Recommendations CIR    Equipment Recommendations  Rolling walker with 5" wheels    Recommendations for Other Services       Precautions / Restrictions Precautions Precautions: Fall Restrictions Weight Bearing Restrictions: No      Mobility  Bed Mobility Overal bed mobility: Needs Assistance Bed Mobility: Supine to Sit     Supine to sit: Min guard;HOB elevated     General bed mobility comments: pt in recliner upon arrival to the room.    Transfers Overall transfer level: Needs assistance Equipment used: Rolling walker (2 wheeled) Transfers: Sit to/from Stand Sit to Stand: Min guard         General transfer comment: MOD A for upward momentum and standing balance  Ambulation/Gait Ambulation/Gait assistance: Min guard;Min assist;Mod assist Gait Distance (Feet): 26 Feet Assistive device: Rolling walker (2 wheeled) Gait Pattern/deviations: Step-to pattern;Decreased step length - right Gait velocity: decreased   General Gait Details: Pt has difficulty lifting the R LE for foot clearance and tends to lateral lean to the R side as well.  Pt is unstbale in walking and requires min-modA for remaining upright due to imbalance to the R.  Stairs            Wheelchair Mobility    Modified Rankin (Stroke Patients Only)       Balance Overall balance assessment: Needs assistance Sitting-balance support: No upper extremity supported;Feet supported Sitting balance-Leahy Scale: Fair Sitting balance - Comments: Good static sitting balance at edge  of recliner   Standing balance support: No upper extremity supported;During functional activity Standing balance-Leahy Scale: Poor Standing balance comment: Pt has difficulty with R lateral lean when in standing.                             Pertinent Vitals/Pain Pain Assessment: No/denies pain    Home Living Family/patient expects to be discharged to:: Private residence Living Arrangements: Alone (pt reports she has significant other who can assist, do not live together) Available Help at Discharge: Friend(s);Available 24 hours/day (significant other) Type of Home: House Home Access: Stairs to enter Entrance Stairs-Rails: Right;Left;Can reach both Entrance Stairs-Number of Steps: per pt report 4-6 Home Layout: Two level Home Equipment: Walker - 2 wheels;Cane - single point      Prior Function Level of Independence: Independent with assistive device(s)         Comments: pt reports she has walker and cane, does not use often     Hand Dominance   Dominant Hand: Right    Extremity/Trunk Assessment   Upper Extremity Assessment Upper Extremity Assessment: Defer to OT evaluation    Lower Extremity Assessment Lower Extremity Assessment: Generalized weakness;RLE deficits/detail RLE Deficits / Details: Significant R hip flexion weakness       Communication   Communication: No difficulties  Cognition Arousal/Alertness: Awake/alert Behavior During Therapy: WFL for tasks assessed/performed Overall Cognitive Status: Within Functional Limits for tasks assessed                                        General Comments General comments (skin integrity, edema, etc.): HR 120s during standing grooming tasks. At end of session, pt left in recliner with HR 95    Exercises     Assessment/Plan    PT Assessment Patient needs continued PT services  PT Problem List Decreased strength;Decreased activity tolerance;Decreased balance;Decreased  mobility;Decreased coordination;Decreased knowledge of use of DME;Decreased safety awareness       PT Treatment Interventions DME instruction;Gait training;Stair training;Functional mobility training;Therapeutic activities;Therapeutic exercise;Balance training;Neuromuscular re-education    PT Goals (Current goals can be found in the Care Plan section)  Acute Rehab PT Goals Patient Stated Goal: to get better PT Goal Formulation: With patient Time For Goal Achievement: 10/06/20 Potential to Achieve Goals: Good    Frequency 7X/week   Barriers to discharge Decreased caregiver support      Co-evaluation               AM-PAC PT "6 Clicks" Mobility  Outcome Measure Help needed turning from your back to your side while in a flat bed without using bedrails?: A Lot Help needed moving from lying on your back to sitting on the side of a flat bed without using bedrails?: A Lot Help needed moving to and from a bed to a chair (including a wheelchair)?: A Lot Help needed standing up from a chair using your arms (e.g., wheelchair or bedside chair)?: A Lot Help needed to walk in hospital room?: A Lot Help needed climbing 3-5 steps with  a railing? : Total 6 Click Score: 11    End of Session Equipment Utilized During Treatment: Gait belt Activity Tolerance: Patient tolerated treatment well;Patient limited by fatigue Patient left: in chair;with call bell/phone within reach;with chair alarm set Nurse Communication: Mobility status PT Visit Diagnosis: Unsteadiness on feet (R26.81);Other abnormalities of gait and mobility (R26.89);Muscle weakness (generalized) (M62.81);Difficulty in walking, not elsewhere classified (R26.2);Dizziness and giddiness (R42)    Time: 7793-9030 PT Time Calculation (min) (ACUTE ONLY): 26 min   Charges:     PT Treatments $Gait Training: 23-37 mins        Nolon Bussing, PT, DPT 09/22/20, 1:50 PM   Phineas Real 09/22/2020, 1:45 PM

## 2020-09-22 NOTE — Progress Notes (Signed)
Inpatient Rehab Admissions Coordinator Note:   Consult received.  Note pt with non-participating [employer].  Appears to be with [facility] or [facility].  Spoke to Robbie Lis, RN CM, and let her know pt would need to seek AIR at an alternative facility.   Estill Dooms, PT, DPT [phone] 09/22/20 3:53 PM

## 2020-09-22 NOTE — Progress Notes (Signed)
*  PRELIMINARY RESULTS* Echocardiogram 2D Echocardiogram has been performed.  Lacey Gonzalez 09/22/2020, 8:51 AM

## 2020-09-22 NOTE — Progress Notes (Signed)
PROGRESS NOTE    Lacey Gonzalez  IWP:[id number] DOB: May 31, 1955 DOA: 09/20/2020 PCP: [facility]    Brief Narrative:  65 year old female with a history of diabetes, hypertension, admitted to the hospital with dizziness, generalized weakness.  It was reported that she had right-sided facial droop, and weakness in her right arm and leg.  She was admitted for further work-up for CVA.  MRI brain confirmed acute infarct involving left thalamus and left cerebral peduncle.  Neurology following.   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 coli -She has been receiving ceftriaxone, will change to Keflex  Insulin-dependent diabetes -Reports taking Lantus at home -Started on basal insulin -Continue on sliding scale and adjust regimen as needed -A1c 11.6 -Continue to follow blood sugars  Diabetic gastroparesis -Continue on Reglan  Hypertension -Blood pressure currently elevated -Restarted on home dose of ARB   DVT prophylaxis: enoxaparin (LOVENOX) injection 40 mg Start: 09/20/20 2015 SCD's Start: 09/20/20 1957  Code Status: Full code Family Communication: No family present Disposition Plan: Status is: Inpatient  Remains inpatient  appropriate because:Ongoing diagnostic testing needed not appropriate for outpatient work up, Unsafe d/c plan, and Inpatient level of care appropriate due to severity of illness  Dispo: The patient is from: Home              Anticipated d/c is to: CIR              Patient currently is not medically stable to d/c.   Difficult to place patient No         Consultants:  Neurology  Procedures:    Antimicrobials:  Ceftriaxone 9/18> 9/20 Keflex 9/20 >   Subjective: Sleeping on my arrival, wakes up to voice.  No new complaints  Objective: Vitals:   09/22/20 0832 09/22/20 1109 09/22/20 1557 09/22/20 2000  BP: (!) 156/71 (!) 153/72 (!) 169/77 (!) 177/66  Pulse: 90 91 81 92  Resp: 16 16 16 18   Temp: 98 F (36.7 C) 98.4 F (36.9 C) 97.9 F (36.6 C) 97.9 F (36.6 C)  TempSrc: Oral Oral  Oral  SpO2: 99% 96% 97% 98%  Weight:      Height:        Intake/Output Summary (Last 24 hours) at 09/22/2020 2130 Last data filed at 09/22/2020 1913 Gross per 24 hour  Intake 240 ml  Output --  Net 240 ml   Filed Weights   09/20/20 1206 09/21/20 0101  Weight: 67.1 kg 67.4 kg    Examination:  General exam: Alert, awake, oriented x 3 Respiratory system: Clear to auscultation. Respiratory effort normal. Cardiovascular system:RRR. No murmurs, rubs, gallops. Gastrointestinal system: Abdomen is nondistended, soft and nontender. No organomegaly or masses felt. Normal bowel sounds heard. Central nervous system: Alert and oriented.  Right upper extremity weakness, right lower extremity weakness,  speech is dysarthric Extremities: No C/C/E, +pedal pulses Skin: No rashes, lesions or ulcers Psychiatry: Judgement and insight appear normal. Mood & affect appropriate.     Data Reviewed: I have personally reviewed following labs and imaging studies  CBC: Recent Labs  Lab 09/20/20 1210 09/21/20 0424 09/22/20 0425  WBC 8.5 7.0 6.4  HGB 14.8 13.7 14.1  HCT 42.4 38.8 40.6  MCV 86.0 86.0  85.8  PLT 296 265 265   Basic Metabolic Panel: Recent Labs  Lab 09/20/20 1210 09/22/20 0425  NA 131* 137  K 4.2 3.6  CL 98 104  CO2 24 27  GLUCOSE 427* 131*  BUN 21 12  CREATININE 0.73 0.56  CALCIUM 9.3 9.3   GFR: Estimated Creatinine Clearance: 63.9 mL/min (by C-G formula based on SCr of 0.56 mg/dL). Liver Function Tests: Recent Labs  Lab 09/20/20 1210 09/22/20 0425  AST 20 20  ALT 23 17  ALKPHOS 116 101  BILITOT 0.6 0.6  PROT 6.8 6.2*  ALBUMIN 3.8 3.4*   No results for input(s): LIPASE, AMYLASE in the last 168 hours. No results for input(s): AMMONIA in the last 168 hours. Coagulation Profile: Recent Labs  Lab 09/20/20 1650  INR 0.9   Cardiac Enzymes: Recent Labs  Lab 09/21/20 0424  CKTOTAL 41   BNP (last 3 results) No results for input(s): PROBNP in the last 8760 hours. HbA1C: Recent Labs    09/20/20 1650 09/21/20 0424  HGBA1C 11.0* 11.6*   CBG: Recent Labs  Lab 09/22/20 0426 09/22/20 0836 09/22/20 1106 09/22/20 1551 09/22/20 1937  GLUCAP 127* 113* 258* 129* 263*   Lipid Profile: Recent Labs    09/20/20 1210 09/21/20 0424  CHOL  --  192  HDL  --  40*  LDLCALC  --  111*  TRIG  --  203*  CHOLHDL  --  4.8  LDLDIRECT 122.9*  --    Thyroid Function Tests: No results for input(s): TSH, T4TOTAL, FREET4, T3FREE, THYROIDAB in the last 72 hours. Anemia Panel: No results for input(s): VITAMINB12, FOLATE, FERRITIN, TIBC, IRON, RETICCTPCT in the last 72 hours. Sepsis Labs: No results for input(s): PROCALCITON, LATICACIDVEN in the last 168 hours.  Recent Results (from the past 240 hour(s))  Urine Culture     Status: Abnormal   Collection Time: 09/20/20 12:10 PM   Specimen: Urine, Clean Catch  Result Value Ref Range Status   Specimen Description   Final    URINE, CLEAN CATCH Performed at [facility], [address], [place], [place] [address]    Special Requests   Final    NONE Performed at [facility], [address] [place], [place] [address]    Culture >=100,000 COLONIES/mL ESCHERICHIA COLI (A)  Final   Report Status 09/22/2020 FINAL  Final   Organism ID, Bacteria ESCHERICHIA COLI (A)  Final      Susceptibility   Escherichia coli - MIC*    AMPICILLIN <=2 SENSITIVE Sensitive     CEFAZOLIN <=4 SENSITIVE Sensitive     CEFEPIME <=0.12 SENSITIVE Sensitive     CEFTRIAXONE <=0.25 SENSITIVE Sensitive     CIPROFLOXACIN <=0.25 SENSITIVE Sensitive     GENTAMICIN <=1 SENSITIVE Sensitive     IMIPENEM <=0.25 SENSITIVE Sensitive     NITROFURANTOIN <=16 SENSITIVE Sensitive     TRIMETH/SULFA <=20 SENSITIVE Sensitive     AMPICILLIN/SULBACTAM <=2 SENSITIVE Sensitive     PIP/TAZO <=4 SENSITIVE Sensitive     * >=100,000 COLONIES/mL ESCHERICHIA COLI  Resp Panel by  RT-PCR (Flu A&B, Covid) Nasopharyngeal Swab     Status: None   Collection Time: 09/20/20 10:02 PM   Specimen: Nasopharyngeal Swab; Nasopharyngeal(NP) swabs in vial transport medium  Result Value Ref Range Status   SARS Coronavirus 2 by RT PCR NEGATIVE NEGATIVE Final    Comment: (NOTE) SARS-CoV-2 target nucleic acids are NOT DETECTED.  The SARS-CoV-2 RNA is generally detectable in upper respiratory specimens during the acute phase of infection. The lowest concentration of SARS-CoV-2 viral copies this assay can detect is 138 copies/mL. A negative result does not preclude SARS-Cov-2 infection and should not be used as the sole basis for treatment or other patient management decisions. A negative result may occur with  improper specimen collection/handling, submission of specimen other than nasopharyngeal swab, presence of viral mutation(s) within the areas targeted by this assay, and inadequate number of viral copies(<138 copies/mL). A negative result must be combined with clinical observations, patient history, and epidemiological information. The expected result is Negative.  Fact Sheet for Patients:   BloggerCourse.com  Fact Sheet for Healthcare Providers:  SeriousBroker.it  This test is no t yet approved or cleared by the [place] FDA and  has been authorized for detection and/or diagnosis of SARS-CoV-2 by FDA under an Emergency Use Authorization (EUA). This EUA will remain  in effect (meaning this test can be used) for the duration of the COVID-19 declaration under Section 564(b)(1) of the Act, 21 U.S.C.section 360bbb-3(b)(1), unless the authorization is terminated  or revoked sooner.       Influenza A by PCR NEGATIVE NEGATIVE Final   Influenza B by PCR NEGATIVE NEGATIVE Final    Comment: (NOTE) The Xpert Xpress SARS-CoV-2/FLU/RSV plus assay is intended as an aid in the diagnosis of influenza from Nasopharyngeal swab specimens and should not be used as a sole basis for treatment. Nasal washings and aspirates are unacceptable for Xpert Xpress SARS-CoV-2/FLU/RSV testing.  Fact Sheet for Patients: BloggerCourse.com  Fact Sheet for Healthcare Providers: SeriousBroker.it  This test is not yet approved or cleared by the [place] FDA and has been authorized for detection and/or diagnosis of SARS-CoV-2 by FDA under an Emergency Use Authorization (EUA). This EUA will remain in effect (meaning this test can be used) for the duration of the COVID-19 declaration under Section 564(b)(1) of the Act, 21 U.S.C. section 360bbb-3(b)(1), unless the authorization is terminated or revoked.  Performed at [facility], [address] [place], [place] [address]          Radiology Studies: US Carotid Bilateral (at [facility] and [facility] only)  Result Date: 09/20/2020 CLINICAL DATA:  Stroke. Dizziness. Bilateral leg and white arm weakness. Syncope and diabetes. EXAM: BILATERAL CAROTID DUPLEX ULTRASOUND TECHNIQUE: Wallace Cullens scale imaging, color Doppler and duplex ultrasound were  performed of bilateral carotid and vertebral arteries in the neck. COMPARISON:  None. FINDINGS: Criteria: Quantification of carotid stenosis is based on velocity parameters that correlate the residual internal carotid diameter with NASCET-based stenosis levels, using the diameter of the distal internal carotid lumen as the denominator for stenosis measurement. The following velocity measurements were obtained: RIGHT ICA: 86/28 cm/sec CCA: 68/14 cm/sec SYSTOLIC ICA/CCA RATIO:  1.3 ECA: 77 cm/sec LEFT ICA: 120/30 cm/sec CCA: 89/12 cm/sec SYSTOLIC ICA/CCA RATIO:  1.4 ECA: 58 cm/sec RIGHT CAROTID ARTERY: Mild intimal thickening without significant calcific plaque formation. Homogeneous flow demonstrated in the carotid bifurcation on color flow Doppler imaging. Normal flow velocity waveforms. RIGHT VERTEBRAL ARTERY:  Patent with antegrade flow direction. LEFT CAROTID ARTERY: Mild noncalcific plaque  formation. Homogeneous flow demonstrated in the carotid bifurcation on color flow Doppler imaging. Normal flow velocity waveforms. LEFT VERTEBRAL ARTERY:  Patent with antegrade flow direction. IMPRESSION: No evidence of hemodynamically significant stenosis of either right or the left internal carotid artery. Noncalcific plaque formation is demonstrated in the distal left common carotid artery without significant flow alteration. Electronically Signed   By: Burman Nieves M.D.   On: 09/20/2020 22:22   ECHOCARDIOGRAM COMPLETE  Result Date: 09/22/2020    ECHOCARDIOGRAM REPORT   Patient Name:   Lacey Gonzalez Date of Exam: 09/22/2020 Medical Rec #:  [id number]     Height:       62.0 in Accession #:    [id number]    Weight:       148.0 lb Date of Birth:  1955-05-12    BSA:          1.682 m Patient Age:    64 years      BP:           164/77 mmHg Patient Gender: F             HR:           86 bpm. Exam Location:  [facility] Procedure: 2D Echo, Color Doppler, Cardiac Doppler and Saline Contrast Bubble            Study Indications:      I63.9 Stroke  History:         Patient has no prior history of Echocardiogram examinations.                  Risk Factors:Diabetes.  Sonographer:     Humphrey Rolls Referring Phys:  [id number] Rayne Du Diagnosing Phys: Alwyn Pea MD IMPRESSIONS  1. Left ventricular ejection fraction, by estimation, is 55 to 60%. The left ventricle has normal function. The left ventricle has no regional wall motion abnormalities. There is mild concentric left ventricular hypertrophy. Left ventricular diastolic parameters are consistent with Grade II diastolic dysfunction (pseudonormalization).  2. Right ventricular systolic function is normal. The right ventricular size is normal.  3. The mitral valve is grossly normal. Mild mitral valve regurgitation.  4. The aortic valve is normal in structure. Aortic valve regurgitation is not visualized. FINDINGS  Left Ventricle: Left ventricular ejection fraction, by estimation, is 55 to 60%. The left ventricle has normal function. The left ventricle has no regional wall motion abnormalities. The left ventricular internal cavity size was normal in size. There is  mild concentric left ventricular hypertrophy. Left ventricular diastolic parameters are consistent with Grade II diastolic dysfunction (pseudonormalization). Right Ventricle: The right ventricular size is normal. No increase in right ventricular wall thickness. Right ventricular systolic function is normal. Left Atrium: Left atrial size was normal in size. Right Atrium: Right atrial size was normal in size. Pericardium: There is no evidence of pericardial effusion. Mitral Valve: The mitral valve is grossly normal. Mild mitral valve regurgitation. Tricuspid Valve: The tricuspid valve is normal in structure. Tricuspid valve regurgitation is mild. Aortic Valve: The aortic valve is normal in structure. Aortic valve regurgitation is not visualized. Pulmonic Valve: The pulmonic valve was grossly normal. Pulmonic valve regurgitation is not  visualized. Aorta: The ascending aorta was not well visualized. IAS/Shunts: No atrial level shunt detected by color flow Doppler. Agitated saline contrast was given intravenously to evaluate for intracardiac shunting. Alwyn Pea MD Electronically signed by Alwyn Pea MD Signature Date/Time: 09/22/2020/4:58:04 PM    Final  Scheduled Meds:  (feeding supplement) PROSource Plus  30 mL Oral BID BM   aspirin EC  81 mg Oral Daily   atorvastatin  40 mg Oral Daily   [START ON 09/23/2020] cephALEXin  500 mg Oral Q12H   DULoxetine  30 mg Oral Daily   enoxaparin (LOVENOX) injection  40 mg Subcutaneous Q24H   feeding supplement (GLUCERNA SHAKE)  237 mL Oral TID BM   influenza vac split quadrivalent PF  0.5 mL Intramuscular Tomorrow-1000   insulin aspart  0-15 Units Subcutaneous Q4H   insulin glargine-yfgn  7 Units Subcutaneous Daily   [START ON 09/23/2020] irbesartan  150 mg Oral Daily   metoCLOPramide (REGLAN) injection  10 mg Intravenous TID AC   multivitamin with minerals  1 tablet Oral Daily   Ensure Max Protein  11 oz Oral Daily   Continuous Infusions:     LOS: 2 days    Time spent:    Erick Blinks, MD [facility]   If 7PM-7AM, please contact night-coverage [email or web address]  09/22/2020, 9:30 PM

## 2020-09-22 NOTE — Progress Notes (Signed)
Inpatient Diabetes Program Recommendations  AACE/ADA: New Consensus Statement on Inpatient Glycemic Control   Target Ranges:  Prepandial:   less than 140 mg/dL      Peak postprandial:   less than 180 mg/dL (1-2 hours)      Critically ill patients:  140 - 180 mg/dL   Results for LAQUINTA, HAZELL (MRN [id number]) as of 09/22/2020 11:01  Ref. Range 09/21/2020 07:41 09/21/2020 12:04 09/21/2020 12:57 09/21/2020 16:17 09/21/2020 19:58 09/22/2020 01:15 09/22/2020 04:26 09/22/2020 08:36  Glucose-Capillary Latest Ref Range: 70 - 99 mg/dL 660 (H)  Novolog 3 units 169 (H)  Novolog 3 units 154 (H)  Novolog 3 units 170 (H)  Novolog 3 units 191 (H)  Novolog 3 units 231 (H)  Novolog 8 units 127 (H)  Novolog 2 units 113 (H)    Review of Glycemic Control  Diabetes history: DM2 Outpatient Diabetes medications: Lantus 40 units QHS, Januvia 100 mg daily Current orders for Inpatient glycemic control: Novolog 0-15 units Q4H   Inpatient Diabetes Program Recommendations:     Insulin: Patient has received a total of Novolog 22 units over the past 24 hours and glucose has ranged from 113-231 mg/dl over past 24 hours. May want to consider ordering Semglee 7 units Q24H (based on 67.4 kg x 0.1 units).  HbgA1C:  A1C 11.6% on 09/21/20 indicating an average glucose of 286 mg/dl over the past 2-3 months.  Thanks, Orlando Penner, RN, MSN, CDE Diabetes Coordinator Inpatient Diabetes Program [phone] (Team Pager from 8am to 5pm)

## 2020-09-23 DIAGNOSIS — N390 Urinary tract infection, site not specified: Secondary | ICD-10-CM | POA: Diagnosis not present

## 2020-09-23 DIAGNOSIS — E440 Moderate protein-calorie malnutrition: Secondary | ICD-10-CM | POA: Diagnosis not present

## 2020-09-23 DIAGNOSIS — I10 Essential (primary) hypertension: Secondary | ICD-10-CM | POA: Diagnosis not present

## 2020-09-23 DIAGNOSIS — E1169 Type 2 diabetes mellitus with other specified complication: Secondary | ICD-10-CM | POA: Diagnosis not present

## 2020-09-23 LAB — GLUCOSE, CAPILLARY
Glucose-Capillary: 137 mg/dL — ABNORMAL HIGH (ref 70–99)
Glucose-Capillary: 178 mg/dL — ABNORMAL HIGH (ref 70–99)
Glucose-Capillary: 192 mg/dL — ABNORMAL HIGH (ref 70–99)
Glucose-Capillary: 181 mg/dL — ABNORMAL HIGH (ref 70–99)
Glucose-Capillary: 236 mg/dL — ABNORMAL HIGH (ref 70–99)

## 2020-09-23 NOTE — Plan of Care (Signed)
TTE reveals LVEF of 55 to 60%. The left ventricle has normal function with no regional  wall motion abnormalities. No mural thrombus or valvular vegetation is described in the report.   Stroke work up completed.   Will need outpatient Neurology follow up after discharge.   Electronically signed: Dr. Caryl Pina

## 2020-09-23 NOTE — Plan of Care (Signed)
Patient is A/o x 4. Patient is refusing Q4hr CBG, she states that she does not want to pay for it. I educated the patient on the importance of CBGs she did agree to having them done AC/HS only. Patient reports no pain at this time. RUA is still very weak with minimal grip. Will continue to round as appropriate.  Problem: Education: Goal: Knowledge of General Education information will improve Description: Including pain rating scale, medication(s)/side effects and non-pharmacologic comfort measures Outcome: Progressing   Problem: Pain Managment: Goal: General experience of comfort will improve Outcome: Progressing   Problem: Safety: Goal: Ability to remain free from injury will improve Outcome: Progressing   Problem: Elimination: Goal: Will not experience complications related to bowel motility Outcome: Progressing Goal: Will not experience complications related to urinary retention Outcome: Progressing   Problem: Coping: Goal: Level of anxiety will decrease Outcome: Progressing

## 2020-09-23 NOTE — Progress Notes (Signed)
Physical Therapy Treatment Patient Details Name: Lacey Gonzalez MRN: [id number] DOB: 03-15-1955 Today's Date: 09/23/2020   History of Present Illness Lacey Gonzalez is a 65 y.o. female admitted with reports of intermittent dizziness, weakness R>L side, R facial droop. 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 No hemorrhage or mass effect, Short segment occlusion of the right ACA A2 segment, Mild stenosis of the midportion of the left P2 segment, Motion degraded MRA of the neck without visible occlusion or high-grade stenosis.    PT Comments    Pt continues to be groggy upon arrival, and is upright in bed upon arrival.  Pt did request to sit on toilet upon arrival to void.  Pt making improvements in ability to ambulate further distance, however notes that she still relies heavily on walker and continues to veer to the right side when verbal cues are not given.  Pt also has increased difficulty with turns.  Pt transferred back to bed with all needs met and call bell within reach.  Current discharge plans to CIR remain appropriate at this time.  Pt will continue to benefit from skilled therapy in order to address deficits listed below.    Recommendations for follow up therapy are one component of a multi-disciplinary discharge planning process, led by the attending physician.  Recommendations may be updated based on patient status, additional functional criteria and insurance authorization.  Follow Up Recommendations  CIR     Equipment Recommendations  Rolling walker with 5" wheels    Recommendations for Other Services       Precautions / Restrictions Precautions Precautions: Fall Restrictions Weight Bearing Restrictions: No      Mobility  Bed Mobility Overal bed mobility: Needs Assistance Bed Mobility: Supine to Sit     Supine to sit: Min guard;HOB elevated          Transfers Overall transfer level: Needs assistance Equipment used: Rolling walker (2 wheeled) Transfers: Sit to/from Stand Sit to Stand: Mod assist         General transfer comment: MOD A for upward momentum and standing balance  Ambulation/Gait Ambulation/Gait assistance: Min guard;Min assist;Mod assist Gait Distance (Feet): 40 Feet Assistive device: Rolling walker (2 wheeled) Gait Pattern/deviations: Step-to pattern;Decreased step length - right Gait velocity: decreased   General Gait Details: Pt still has difficulty with R leaning and tends to run into things if not given verbal cues on the R side.  Pt also with significant weakness in the R LE.   Stairs             Wheelchair Mobility    Modified Rankin (Stroke Patients Only)       Balance Overall balance assessment: Needs assistance Sitting-balance support: No upper extremity supported;Feet supported Sitting balance-Leahy Scale: Fair Sitting balance - Comments: Good static sitting balance at edge of bed   Standing balance support: No upper extremity supported;During functional activity Standing balance-Leahy Scale: Poor Standing balance comment: Pt has difficulty with R lateral lean when in standing.                            Cognition Arousal/Alertness: Awake/alert Behavior During Therapy: WFL for tasks assessed/performed Overall Cognitive Status: Within Functional Limits for tasks assessed  General Comments: Pt is alert and oriented, however affect appears flat      Exercises      General Comments        Pertinent Vitals/Pain Pain Assessment: No/denies pain    Home Living                      Prior Function            PT Goals (current goals can now be found in the care  plan section) Acute Rehab PT Goals Patient Stated Goal: to get better PT Goal Formulation: With patient Time For Goal Achievement: 10/06/20 Potential to Achieve Goals: Good Progress towards PT goals: Progressing toward goals    Frequency    7X/week      PT Plan Current plan remains appropriate    Co-evaluation              AM-PAC PT "6 Clicks" Mobility   Outcome Measure  Help needed turning from your back to your side while in a flat bed without using bedrails?: A Lot Help needed moving from lying on your back to sitting on the side of a flat bed without using bedrails?: A Lot Help needed moving to and from a bed to a chair (including a wheelchair)?: A Lot Help needed standing up from a chair using your arms (e.g., wheelchair or bedside chair)?: A Lot Help needed to walk in hospital room?: A Lot Help needed climbing 3-5 steps with a railing? : Total 6 Click Score: 11    End of Session Equipment Utilized During Treatment: Gait belt Activity Tolerance: Patient tolerated treatment well;Patient limited by fatigue Patient left: in chair;with call bell/phone within reach;with chair alarm set Nurse Communication: Mobility status PT Visit Diagnosis: Unsteadiness on feet (R26.81);Other abnormalities of gait and mobility (R26.89);Muscle weakness (generalized) (M62.81);Difficulty in walking, not elsewhere classified (R26.2);Dizziness and giddiness (R42)     Time: 1537-1600 PT Time Calculation (min) (ACUTE ONLY): 23 min  Charges:  $Gait Training: 23-37 mins                     Joshua Robbins, PT, DPT 09/23/20, 11:47 PM    Joshua W Robbins 09/23/2020, 11:45 PM  

## 2020-09-23 NOTE — Progress Notes (Signed)
PROGRESS NOTE  Lacey Gonzalez UEA:[id number] DOB: 03/21/1955 DOA: 09/20/2020 PCP: [facility]   LOS: 3 days   Brief narrative: 65 year old female with a history of diabetes, hypertension, admitted to the hospital with dizziness, generalized weakness.  It was reported that she had right-sided facial droop, and weakness in her right arm and leg.  She was admitted for further work-up for CVA.  MRI brain confirmed acute infarct involving left thalamus and left cerebral peduncle.  Neurology following.  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  At this time patient is on aspirin, Lipitor.  2D echocardiogram showed preserved LV function.  PT OT recommended CIR.  Currently awaiting for placement.   E. coli urinary tract infection Patient to complete 3-day course of antibiotic.   diabetes mellitus type II -Patient is on Lantus, Januvia as outpatient.  We will continue Lantus sliding scale insulin while in the hospital.     Diabetic gastroparesis  on Reglan   Essential hypertension Continue ARB  DVT prophylaxis: enoxaparin (LOVENOX) injection 40 mg Start: 09/20/20 2015 SCD's Start: 09/20/20 1957   Code Status: Full code  Family Communication: None  Status is: Inpatient  Remains inpatient appropriate because: Waiting for rehabilitation.  Dispo: The patient is from: Home              Anticipated d/c is to: CIR              Patient currently is medically stable to d/c.   Difficult to place patient No   Consultants: Neurology  Procedures: None  Anti-infectives:  Keflex- complete today  Anti-infectives (From admission, onward)    Start     Dose/Rate  Route Frequency Ordered Stop   09/23/20 1000  cephALEXin (KEFLEX) capsule 500 mg        500 mg Oral Every 12 hours 09/22/20 1302 09/23/20 2359   09/21/20 0900  cefTRIAXone (ROCEPHIN) 1 g in sodium chloride 0.9 % 100 mL IVPB  Status:  Discontinued        1 g 200 mL/hr over 30 Minutes Intravenous Every 24 hours 09/20/20 2005 09/22/20 1302   09/20/20 1615  cefTRIAXone (ROCEPHIN) 1 g in sodium chloride 0.9 % 100 mL IVPB  Status:  Discontinued        1 g 200 mL/hr over 30 Minutes Intravenous  Once 09/20/20 1600 09/22/20 1302      Subjective: Today, patient was seen and examined at bedside.  Patient denies interval complaints.  Denies any nausea, vomiting fever chills or rigor  Objective: Vitals:   09/23/20 0713 09/23/20 1130  BP: (!) 160/75 (!) 163/62  Pulse: 87 86  Resp: 18 18  Temp: 98.2 F (36.8 C) 98.9 F (37.2 C)  SpO2: 97% 96%    Intake/Output Summary (Last 24 hours) at 09/23/2020 1328 Last data filed at 09/23/2020 1022 Gross per 24 hour  Intake 240 ml  Output --  Net 240 ml   Filed Weights   09/20/20 1206 09/21/20 0101  Weight: 67.1 kg 67.4 kg   Body mass index is 27.18 kg/m.   Physical Exam: GENERAL: Patient is alert awake and oriented. Not in obvious distress. HENT: No scleral pallor or icterus. Pupils equally reactive to light. Oral  mucosa is moist NECK: is supple, no gross swelling noted. CHEST: Clear to auscultation. No crackles or wheezes.  Diminished breath sounds bilaterally. CVS: S1 and S2 heard, no murmur. Regular rate and rhythm.  ABDOMEN: Soft, non-tender, bowel sounds are present. EXTREMITIES: No edema. CNS: Cranial nerves are intact.  Mild right-sided weakness, mild dysarthria SKIN: warm and dry without rashes.  Data Review: I have personally reviewed the following laboratory data and studies,  CBC: Recent Labs  Lab 09/20/20 1210 09/21/20 0424 09/22/20 0425  WBC 8.5 7.0 6.4  HGB 14.8 13.7 14.1  HCT 42.4 38.8 40.6  MCV 86.0 86.0 85.8  PLT  296 265 265   Basic Metabolic Panel: Recent Labs  Lab 09/20/20 1210 09/22/20 0425  NA 131* 137  K 4.2 3.6  CL 98 104  CO2 24 27  GLUCOSE 427* 131*  BUN 21 12  CREATININE 0.73 0.56  CALCIUM 9.3 9.3   Liver Function Tests: Recent Labs  Lab 09/20/20 1210 09/22/20 0425  AST 20 20  ALT 23 17  ALKPHOS 116 101  BILITOT 0.6 0.6  PROT 6.8 6.2*  ALBUMIN 3.8 3.4*   No results for input(s): LIPASE, AMYLASE in the last 168 hours. No results for input(s): AMMONIA in the last 168 hours. Cardiac Enzymes: Recent Labs  Lab 09/21/20 0424  CKTOTAL 41   BNP (last 3 results) No results for input(s): BNP in the last 8760 hours.  ProBNP (last 3 results) No results for input(s): PROBNP in the last 8760 hours.  CBG: Recent Labs  Lab 09/22/20 1937 09/22/20 2326 09/23/20 0436 09/23/20 0713 09/23/20 1214  GLUCAP 263* 216* 137* 178* 192*   Recent Results (from the past 240 hour(s))  Urine Culture     Status: Abnormal   Collection Time: 09/20/20 12:10 PM   Specimen: Urine, Clean Catch  Result Value Ref Range Status   Specimen Description   Final    URINE, CLEAN CATCH Performed at [facility], [address], [place], [place] [address]    Special Requests   Final    NONE Performed at [facility], [address] [place], [place] [address]    Culture >=100,000 COLONIES/mL ESCHERICHIA COLI (A)  Final   Report Status 09/22/2020 FINAL  Final   Organism ID, Bacteria ESCHERICHIA COLI (A)  Final      Susceptibility   Escherichia coli - MIC*    AMPICILLIN <=2 SENSITIVE Sensitive     CEFAZOLIN <=4 SENSITIVE Sensitive     CEFEPIME <=0.12 SENSITIVE Sensitive     CEFTRIAXONE <=0.25 SENSITIVE Sensitive     CIPROFLOXACIN <=0.25 SENSITIVE Sensitive     GENTAMICIN <=1 SENSITIVE Sensitive     IMIPENEM <=0.25 SENSITIVE Sensitive     NITROFURANTOIN <=16 SENSITIVE Sensitive     TRIMETH/SULFA <=20 SENSITIVE Sensitive     AMPICILLIN/SULBACTAM <=2 SENSITIVE Sensitive      PIP/TAZO <=4 SENSITIVE Sensitive     * >=100,000 COLONIES/mL ESCHERICHIA COLI  Resp Panel by RT-PCR (Flu A&B, Covid) Nasopharyngeal Swab     Status: None   Collection Time: 09/20/20 10:02 PM   Specimen: Nasopharyngeal Swab; Nasopharyngeal(NP) swabs in vial transport medium  Result Value Ref Range Status   SARS Coronavirus 2 by RT PCR NEGATIVE NEGATIVE Final    Comment: (NOTE) SARS-CoV-2 target nucleic acids are NOT DETECTED.  The SARS-CoV-2 RNA is generally detectable in upper respiratory specimens during the acute phase of infection. The lowest concentration of SARS-CoV-2 viral copies this assay can detect is 138  copies/mL. A negative result does not preclude SARS-Cov-2 infection and should not be used as the sole basis for treatment or other patient management decisions. A negative result may occur with  improper specimen collection/handling, submission of specimen other than nasopharyngeal swab, presence of viral mutation(s) within the areas targeted by this assay, and inadequate number of viral copies(<138 copies/mL). A negative result must be combined with clinical observations, patient history, and epidemiological information. The expected result is Negative.  Fact Sheet for Patients:  BloggerCourse.com  Fact Sheet for Healthcare Providers:  SeriousBroker.it  This test is no t yet approved or cleared by the [place] FDA and  has been authorized for detection and/or diagnosis of SARS-CoV-2 by FDA under an Emergency Use Authorization (EUA). This EUA will remain  in effect (meaning this test can be used) for the duration of the COVID-19 declaration under Section 564(b)(1) of the Act, 21 U.S.C.section 360bbb-3(b)(1), unless the authorization is terminated  or revoked sooner.       Influenza A by PCR NEGATIVE NEGATIVE Final   Influenza B by PCR NEGATIVE NEGATIVE Final    Comment: (NOTE) The Xpert Xpress  SARS-CoV-2/FLU/RSV plus assay is intended as an aid in the diagnosis of influenza from Nasopharyngeal swab specimens and should not be used as a sole basis for treatment. Nasal washings and aspirates are unacceptable for Xpert Xpress SARS-CoV-2/FLU/RSV testing.  Fact Sheet for Patients: BloggerCourse.com  Fact Sheet for Healthcare Providers: SeriousBroker.it  This test is not yet approved or cleared by the [place] FDA and has been authorized for detection and/or diagnosis of SARS-CoV-2 by FDA under an Emergency Use Authorization (EUA). This EUA will remain in effect (meaning this test can be used) for the duration of the COVID-19 declaration under Section 564(b)(1) of the Act, 21 U.S.C. section 360bbb-3(b)(1), unless the authorization is terminated or revoked.  Performed at [facility], [address] [place], [place] [address]      Studies: ECHOCARDIOGRAM COMPLETE  Result Date: 09/22/2020    ECHOCARDIOGRAM REPORT   Patient Name:   Lacey Gonzalez Date of Exam: 09/22/2020 Medical Rec #:  [id number]     Height:       62.0 in Accession #:    [id number]    Weight:       148.0 lb Date of Birth:  23-Sep-1955    BSA:          1.682 m Patient Age:    64 years      BP:           164/77 mmHg Patient Gender: F             HR:           86 bpm. Exam Location:  [facility] Procedure: 2D Echo, Color Doppler, Cardiac Doppler and Saline Contrast Bubble            Study Indications:     I63.9 Stroke  History:         Patient has no prior history of Echocardiogram examinations.                  Risk Factors:Diabetes.  Sonographer:     Humphrey Rolls Referring Phys:  [id number] Rayne Du Diagnosing Phys: Alwyn Pea MD IMPRESSIONS  1. Left ventricular ejection fraction, by estimation, is 55 to 60%. The left ventricle has normal function. The left ventricle has no regional wall motion abnormalities. There is mild concentric left ventricular  hypertrophy. Left ventricular diastolic parameters  are consistent with Grade II diastolic dysfunction (pseudonormalization).  2. Right ventricular systolic function is normal. The right ventricular size is normal.  3. The mitral valve is grossly normal. Mild mitral valve regurgitation.  4. The aortic valve is normal in structure. Aortic valve regurgitation is not visualized. FINDINGS  Left Ventricle: Left ventricular ejection fraction, by estimation, is 55 to 60%. The left ventricle has normal function. The left ventricle has no regional wall motion abnormalities. The left ventricular internal cavity size was normal in size. There is  mild concentric left ventricular hypertrophy. Left ventricular diastolic parameters are consistent with Grade II diastolic dysfunction (pseudonormalization). Right Ventricle: The right ventricular size is normal. No increase in right ventricular wall thickness. Right ventricular systolic function is normal. Left Atrium: Left atrial size was normal in size. Right Atrium: Right atrial size was normal in size. Pericardium: There is no evidence of pericardial effusion. Mitral Valve: The mitral valve is grossly normal. Mild mitral valve regurgitation. Tricuspid Valve: The tricuspid valve is normal in structure. Tricuspid valve regurgitation is mild. Aortic Valve: The aortic valve is normal in structure. Aortic valve regurgitation is not visualized. Pulmonic Valve: The pulmonic valve was grossly normal. Pulmonic valve regurgitation is not visualized. Aorta: The ascending aorta was not well visualized. IAS/Shunts: No atrial level shunt detected by color flow Doppler. Agitated saline contrast was given intravenously to evaluate for intracardiac shunting. Alwyn Pea MD Electronically signed by Alwyn Pea MD Signature Date/Time: 09/22/2020/4:58:04 PM    Final       Joycelyn Das, MD  [facility] 09/23/2020  If 7PM-7AM, please contact  night-coverage

## 2020-09-23 NOTE — TOC Progression Note (Signed)
Transition of Care [facility]) - Progression Note    Patient Details  Name: LADAJAH SOLTYS MRN: [id number] Date of Birth: [birth year]  [facility] CM/SW Contact  Allayne Butcher, RN Phone Number: 09/23/2020, 2:29 PM  Clinical Narrative:    Per CIR patient's insurance is not in network with them.  RNCM spoke with patient at the bedside and explained that CIR is not an option but there are other Inpatient Rehab facilities at [facility] and [facility].  Patient chooses [facility].  Referral faxed to [facility] Inpatient rehab department.  Patient is Medically cleared for discharge once bed available.     Expected Discharge Plan: IP Rehab Facility Barriers to Discharge: Continued Medical Work up  Expected Discharge Plan and Services Expected Discharge Plan: IP Rehab Facility   Discharge Planning Services: CM Consult Post Acute Care Choice: IP Rehab Living arrangements for the past 2 months: Single Family Home                 DME Arranged: N/A DME Agency: NA       HH Arranged: NA HH Agency: NA         Social Determinants of Health (SDOH) Interventions    Readmission Risk Interventions No flowsheet data found.

## 2020-09-23 NOTE — Progress Notes (Signed)
Inpatient Diabetes Program Recommendations  AACE/ADA: New Consensus Statement on Inpatient Glycemic Control   Target Ranges:  Prepandial:   less than 140 mg/dL      Peak postprandial:   less than 180 mg/dL (1-2 hours)      Critically ill patients:  140 - 180 mg/dL   Results for ZINIA, INNOCENT (MRN [id number]) as of 09/23/2020 11:03  Ref. Range 09/22/2020 08:36 09/22/2020 11:06 09/22/2020 15:51 09/22/2020 19:37 09/22/2020 23:26 09/23/2020 04:36 09/23/2020 07:13  Glucose-Capillary Latest Ref Range: 70 - 99 mg/dL 176 (H) 160 (H) 737 (H) 263 (H) 216 (H) 137 (H) 178 (H)    Review of Glycemic Control  Diabetes history: DM2 Outpatient Diabetes medications: Lantus 40 units QHS, Januvia 100 mg daily Current orders for Inpatient glycemic control: Semglee 7 units daily, Novolog 0-15 units Q4H   Inpatient Diabetes Program Recommendations:     Insulin: Please consider increasing Semglee to 10 units daily.  Thanks, Orlando Penner, RN, MSN, CDE Diabetes Coordinator Inpatient Diabetes Program [phone] (Team Pager from 8am to 5pm)'

## 2020-09-24 DIAGNOSIS — I10 Essential (primary) hypertension: Secondary | ICD-10-CM | POA: Diagnosis not present

## 2020-09-24 DIAGNOSIS — N390 Urinary tract infection, site not specified: Secondary | ICD-10-CM | POA: Diagnosis not present

## 2020-09-24 DIAGNOSIS — E440 Moderate protein-calorie malnutrition: Secondary | ICD-10-CM | POA: Diagnosis not present

## 2020-09-24 DIAGNOSIS — G6289 Other specified polyneuropathies: Secondary | ICD-10-CM

## 2020-09-24 DIAGNOSIS — E1169 Type 2 diabetes mellitus with other specified complication: Secondary | ICD-10-CM | POA: Diagnosis not present

## 2020-09-24 LAB — GLUCOSE, CAPILLARY
Glucose-Capillary: 295 mg/dL — ABNORMAL HIGH (ref 70–99)
Glucose-Capillary: 256 mg/dL — ABNORMAL HIGH (ref 70–99)
Glucose-Capillary: 179 mg/dL — ABNORMAL HIGH (ref 70–99)
Glucose-Capillary: 304 mg/dL — ABNORMAL HIGH (ref 70–99)

## 2020-09-24 MED ORDER — INSULIN GLARGINE-YFGN 100 UNIT/ML ~~LOC~~ SOLN
10.0000 [IU] | Freq: Every day | SUBCUTANEOUS | Status: DC
  Administered 2020-09-24 – 2020-09-25 (×2): 10 [IU] via SUBCUTANEOUS
  Filled 2020-09-24 (×2): qty 0.1

## 2020-09-24 MED ORDER — AMLODIPINE BESYLATE 10 MG PO TABS
10.0000 mg | ORAL_TABLET | Freq: Every day | ORAL | Status: DC
  Administered 2020-09-24 – 2020-10-01 (×7): 10 mg via ORAL
  Filled 2020-09-24 (×8): qty 1

## 2020-09-24 MED ORDER — INSULIN ASPART 100 UNIT/ML IJ SOLN
0.0000 [IU] | Freq: Three times a day (TID) | INTRAMUSCULAR | Status: DC
  Administered 2020-09-24: 8 [IU] via SUBCUTANEOUS
  Administered 2020-09-25: 14:00:00 11 [IU] via SUBCUTANEOUS
  Administered 2020-09-25: 5 [IU] via SUBCUTANEOUS
  Administered 2020-09-25 – 2020-09-26 (×3): 8 [IU] via SUBCUTANEOUS
  Filled 2020-09-24 (×6): qty 1

## 2020-09-24 MED ORDER — GABAPENTIN 100 MG PO CAPS
100.0000 mg | ORAL_CAPSULE | Freq: Three times a day (TID) | ORAL | Status: DC
  Administered 2020-09-24 – 2020-10-01 (×22): 100 mg via ORAL
  Filled 2020-09-24 (×22): qty 1

## 2020-09-24 MED ORDER — INSULIN ASPART 100 UNIT/ML IJ SOLN
0.0000 [IU] | Freq: Every day | INTRAMUSCULAR | Status: DC
  Administered 2020-09-24: 21:00:00 4 [IU] via SUBCUTANEOUS
  Administered 2020-09-25 – 2020-09-26 (×2): 2 [IU] via SUBCUTANEOUS
  Filled 2020-09-24 (×3): qty 1

## 2020-09-24 NOTE — TOC Progression Note (Signed)
Transition of Care [facility]) - Progression Note    Patient Details  Name: Lacey Gonzalez MRN: [id number] Date of Birth: [birth year]  Transition of Care [facility]) CM/SW Contact  Allayne Butcher, RN Phone Number: 09/24/2020, 9:45 AM  Clinical Narrative:    Asher Muir with [facility] inpatient rehab believes that patient is a good candidate for them.  She is passing the referral to the physicians and will reach out to the patient to talk with her about their program.     Expected Discharge Plan: IP Rehab Facility Barriers to Discharge: Continued Medical Work up  Expected Discharge Plan and Services Expected Discharge Plan: IP Rehab Facility   Discharge Planning Services: CM Consult Post Acute Care Choice: IP Rehab Living arrangements for the past 2 months: Single Family Home                 DME Arranged: N/A DME Agency: NA       HH Arranged: NA HH Agency: NA         Social Determinants of Health (SDOH) Interventions    Readmission Risk Interventions No flowsheet data found.

## 2020-09-24 NOTE — Progress Notes (Signed)
PROGRESS NOTE  UMA JERDE FAO:[id number] DOB: 1955/02/05 DOA: 09/20/2020 PCP: [facility]   LOS: 4 days   Brief narrative:  65 year old female with a history of diabetes, hypertension, admitted to the hospital with dizziness, generalized weakness.  It was reported that she had right-sided facial droop, and weakness in her right arm and leg.  She was admitted for further work-up for CVA.  MRI brain confirmed acute infarct involving left thalamus and left cerebral peduncle.  Neurology following.  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  At this time, patient is on aspirin, Lipitor.  2D echocardiogram showed preserved LV function.  PT OT recommended CIR.  Currently awaiting for placement.   E. coli urinary tract infection Patient to complete 3-day course of antibiotic.   diabetes mellitus type II with hyperglycemia. -Patient is on Lantus, Januvia as outpatient.  We will continue Lantus, sliding scale insulin while in the hospital.  Increase Lantus to 10 units at nighttime.   Diabetic gastroparesis  on Reglan.  Stable at this time.  Diabetic peripheral neuropathy. On Cymbalta daily.  Still uncontrolled.  Add low-dose gabapentin.  Essential hypertension Continue Avapro.  Add amlodipine for better blood pressure control.  DVT prophylaxis: enoxaparin (LOVENOX) injection 40 mg Start: 09/20/20 2015 SCD's Start: 09/20/20 1957   Code Status: Full code  Family Communication: None  Status is: Inpatient  Remains inpatient appropriate because:Waiting for rehabilitation.  Dispo: The patient is from: Home              Anticipated d/c is to: CIR               Patient currently is medically stable to d/c.   Difficult to place patient No   Consultants: Neurology  Procedures: None  Anti-infectives:  None.  Anti-infectives (From admission, onward)    Start     Dose/Rate Route Frequency Ordered Stop   09/23/20 1000  cephALEXin (KEFLEX) capsule 500 mg        500 mg Oral Every 12 hours 09/22/20 1302 09/23/20 2051   09/21/20 0900  cefTRIAXone (ROCEPHIN) 1 g in sodium chloride 0.9 % 100 mL IVPB  Status:  Discontinued        1 g 200 mL/hr over 30 Minutes Intravenous Every 24 hours 09/20/20 2005 09/22/20 1302   09/20/20 1615  cefTRIAXone (ROCEPHIN) 1 g in sodium chloride 0.9 % 100 mL IVPB  Status:  Discontinued        1 g 200 mL/hr over 30 Minutes Intravenous  Once 09/20/20 1600 09/22/20 1302      Subjective: Today, patient was seen and examined at bedside.  Complains of neuropathy Pain in the legs.  Has had a bowel movement.  No nausea vomiting or shortness of breath.  Objective: Vitals:   09/24/20 0636 09/24/20 0812  BP: (!) 169/100 (!) 173/85  Pulse: (!) 107 89  Resp: 16 18  Temp: 98 F (36.7 C) 98.6 F (37 C)  SpO2: 97% 99%    Intake/Output Summary (Last 24 hours) at 09/24/2020 0952 Last data filed at 09/23/2020 2052 Gross per 24 hour  Intake 660 ml  Output --  Net 660 ml    Filed Weights   09/20/20 1206 09/21/20  0101  Weight: 67.1 kg 67.4 kg   Body mass index is 27.18 kg/m.   Physical Exam: GENERAL: Patient is alert awake and oriented. Not in obvious distress. HENT: No scleral pallor or icterus. Pupils equally reactive to light. Oral mucosa is moist NECK: is supple, no gross swelling noted. CHEST: Clear to auscultation. No crackles or wheezes.  Diminished breath sounds bilaterally. CVS: S1 and S2 heard, no murmur. Regular rate and rhythm.  ABDOMEN: Soft, non-tender, bowel sounds are present. EXTREMITIES: No edema. CNS:   Mild right-sided weakness.  Right-sided facial weakness. SKIN: warm and dry without  rashes.  Data Review: I have personally reviewed the following laboratory data and studies,  CBC: Recent Labs  Lab 09/20/20 1210 09/21/20 0424 09/22/20 0425  WBC 8.5 7.0 6.4  HGB 14.8 13.7 14.1  HCT 42.4 38.8 40.6  MCV 86.0 86.0 85.8  PLT 296 265 265    Basic Metabolic Panel: Recent Labs  Lab 09/20/20 1210 09/22/20 0425  NA 131* 137  K 4.2 3.6  CL 98 104  CO2 24 27  GLUCOSE 427* 131*  BUN 21 12  CREATININE 0.73 0.56  CALCIUM 9.3 9.3    Liver Function Tests: Recent Labs  Lab 09/20/20 1210 09/22/20 0425  AST 20 20  ALT 23 17  ALKPHOS 116 101  BILITOT 0.6 0.6  PROT 6.8 6.2*  ALBUMIN 3.8 3.4*    No results for input(s): LIPASE, AMYLASE in the last 168 hours. No results for input(s): AMMONIA in the last 168 hours. Cardiac Enzymes: Recent Labs  Lab 09/21/20 0424  CKTOTAL 41    BNP (last 3 results) No results for input(s): BNP in the last 8760 hours.  ProBNP (last 3 results) No results for input(s): PROBNP in the last 8760 hours.  CBG: Recent Labs  Lab 09/23/20 0713 09/23/20 1214 09/23/20 1628 09/23/20 2040 09/24/20 0928  GLUCAP 178* 192* 181* 236* 295*    Recent Results (from the past 240 hour(s))  Urine Culture     Status: Abnormal   Collection Time: 09/20/20 12:10 PM   Specimen: Urine, Clean Catch  Result Value Ref Range Status   Specimen Description   Final    URINE, CLEAN CATCH Performed at [facility], [address], [place], [place] [address]    Special Requests   Final    NONE Performed at [facility], [address] [place], [place] [address]    Culture >=100,000 COLONIES/mL ESCHERICHIA COLI (A)  Final   Report Status 09/22/2020 FINAL  Final   Organism ID, Bacteria ESCHERICHIA COLI (A)  Final      Susceptibility   Escherichia coli - MIC*    AMPICILLIN <=2 SENSITIVE Sensitive     CEFAZOLIN <=4 SENSITIVE Sensitive     CEFEPIME <=0.12 SENSITIVE Sensitive     CEFTRIAXONE <=0.25 SENSITIVE Sensitive      CIPROFLOXACIN <=0.25 SENSITIVE Sensitive     GENTAMICIN <=1 SENSITIVE Sensitive     IMIPENEM <=0.25 SENSITIVE Sensitive     NITROFURANTOIN <=16 SENSITIVE Sensitive     TRIMETH/SULFA <=20 SENSITIVE Sensitive     AMPICILLIN/SULBACTAM <=2 SENSITIVE Sensitive     PIP/TAZO <=4 SENSITIVE Sensitive     * >=100,000 COLONIES/mL ESCHERICHIA COLI  Resp Panel by RT-PCR (Flu A&B, Covid) Nasopharyngeal Swab     Status: None   Collection Time: 09/20/20 10:02 PM   Specimen: Nasopharyngeal Swab; Nasopharyngeal(NP) swabs in vial transport medium  Result Value Ref Range Status   SARS Coronavirus 2 by RT  PCR NEGATIVE NEGATIVE Final    Comment: (NOTE) SARS-CoV-2 target nucleic acids are NOT DETECTED.  The SARS-CoV-2 RNA is generally detectable in upper respiratory specimens during the acute phase of infection. The lowest concentration of SARS-CoV-2 viral copies this assay can detect is 138 copies/mL. A negative result does not preclude SARS-Cov-2 infection and should not be used as the sole basis for treatment or other patient management decisions. A negative result may occur with  improper specimen collection/handling, submission of specimen other than nasopharyngeal swab, presence of viral mutation(s) within the areas targeted by this assay, and inadequate number of viral copies(<138 copies/mL). A negative result must be combined with clinical observations, patient history, and epidemiological information. The expected result is Negative.  Fact Sheet for Patients:  BloggerCourse.com  Fact Sheet for Healthcare Providers:  SeriousBroker.it  This test is no t yet approved or cleared by the [place] FDA and  has been authorized for detection and/or diagnosis of SARS-CoV-2 by FDA under an Emergency Use Authorization (EUA). This EUA will remain  in effect (meaning this test can be used) for the duration of the COVID-19 declaration under Section  564(b)(1) of the Act, 21 U.S.C.section 360bbb-3(b)(1), unless the authorization is terminated  or revoked sooner.       Influenza A by PCR NEGATIVE NEGATIVE Final   Influenza B by PCR NEGATIVE NEGATIVE Final    Comment: (NOTE) The Xpert Xpress SARS-CoV-2/FLU/RSV plus assay is intended as an aid in the diagnosis of influenza from Nasopharyngeal swab specimens and should not be used as a sole basis for treatment. Nasal washings and aspirates are unacceptable for Xpert Xpress SARS-CoV-2/FLU/RSV testing.  Fact Sheet for Patients: BloggerCourse.com  Fact Sheet for Healthcare Providers: SeriousBroker.it  This test is not yet approved or cleared by the [place] FDA and has been authorized for detection and/or diagnosis of SARS-CoV-2 by FDA under an Emergency Use Authorization (EUA). This EUA will remain in effect (meaning this test can be used) for the duration of the COVID-19 declaration under Section 564(b)(1) of the Act, 21 U.S.C. section 360bbb-3(b)(1), unless the authorization is terminated or revoked.  Performed at [facility], [address] [place], [place] [address]       Studies: No results found.    Joycelyn Das, MD  [facility] 09/24/2020  If 7PM-7AM, please contact night-coverage

## 2020-09-24 NOTE — Plan of Care (Signed)
Pt questioning rehab, discussed POC with pt. Discussed with Pt. Importance of usage of right ride to best of ability. Provided coloring book to practice right hand usage. Encuraged pt to try and lift herself with right grip and assist with left side. Pt demonstrates understanding remains to need encouragement to use right side.

## 2020-09-24 NOTE — Progress Notes (Signed)
Occupational Therapy Treatment Patient Details Name: Lacey Gonzalez MRN: [id number] DOB: 05/28/1955 Today's Date: 09/24/2020   History of present illness Lacey Gonzalez is a 65 y.o. female admitted with reports of intermittent dizziness, weakness R>L side, R facial droop. 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 No hemorrhage or mass effect, Short segment occlusion of the right ACA A2 segment, Mild stenosis of the midportion of the left P2 segment, Motion degraded MRA of the neck without visible occlusion or high-grade stenosis.   OT comments  Pt transitioned from PT session and is agreeable to OT intervention. Focus on self care tasks, NMR, vision assessment, and functional cognition. Pt standing from bed with mod lifting assistance and taking several steps with mod HHA to stand at sink for grooming tasks. Pt needing increased time with mod multimodal cuing for sequencing and utilizing R UE in task. If pt not utilizing R UE in task she was placed into standing weight bearing position at sink side. Pt able to utilize B UE coordination as well to brush teeth. Pt given vision assessment not no significant visual neglect or double vision noted. Pt does have decreased smoothness with tracking and unable to converge but this may also be complicated by distractions in the room. Pt needing cuing for safety awareness with R UE with mobility in room. Pt given SLUMS and scored 19/30 with this being considered the "dementia" category indicating pt with cognitive and problem solving deficits.OT will continue to address this during session in regards to functional performance. Pt showing some insight to deficits and very motivated this session. Pt would be excellent  candidate for intensive inpatient rehab to address functional deficits in order to return home at highest level of independence.     Recommendations for follow up therapy are one component of a multi-disciplinary discharge planning process, led by the attending physician.  Recommendations may be updated based on patient status, additional functional criteria and insurance authorization.    Follow Up Recommendations  CIR    Equipment Recommendations  Other (comment) (defer to next venue of care)       Precautions / Restrictions Precautions Precautions: Fall       Mobility Bed Mobility Overal bed mobility: Needs Assistance Bed Mobility: Supine to Sit;Sit to Supine     Supine to sit: Min assist Sit to supine: Min guard   General bed mobility comments: min A for trunk support from flat bed    Transfers Overall transfer level: Needs assistance Equipment used: 1 person hand held assist Transfers: Sit to/from [employer] Sit to Stand: Mod assist Stand pivot transfers: Mod assist            Balance Overall balance assessment: Needs assistance Sitting-balance support: No upper extremity supported;Feet supported Sitting balance-Leahy Scale: Fair Sitting balance - Comments: Good static sitting balance at edge of bed   Standing balance support: No upper extremity supported;During functional activity Standing balance-Leahy Scale: Poor Standing balance comment: LOB to the R with functional tasks and ambulation                           ADL either performed or assessed with clinical judgement   ADL Overall ADL's : Needs assistance/impaired     Grooming: Wash/dry hands;Wash/dry  face;Standing;Minimal assistance;Cueing for sequencing                       Toileting- Clothing Manipulation and Hygiene: Sit to/from stand;Moderate assistance       Functional mobility during ADLs: Moderate assistance       Vision   Vision Assessment?:  Yes Eye Alignment: Within Functional Limits Ocular Range of Motion: Within Functional Limits Tracking/Visual Pursuits: Decreased smoothness of horizontal tracking;Able to track stimulus in all quads without difficulty Saccades: Additional eye shifts occurred during testing Convergence: Other (comment) (Pt did not display convergence when test but likely distracted) Additional Comments: inattention to R UE needing mod - max multimodal cuing to locate and utilize          Cognition Arousal/Alertness: Awake/alert Behavior During Therapy: [facility] for tasks assessed/performed Overall Cognitive Status: Within Functional Limits for tasks assessed                                 General Comments: Pt is alert and oriented. Pt needing mod - max cuing for sequencing and utilizing R UE.                   Pertinent Vitals/ Pain       Pain Assessment: No/denies pain   Frequency  Min 3X/week        Progress Toward Goals  OT Goals(current goals can now be found in the care plan section)  Progress towards OT goals: Progressing toward goals  Acute Rehab OT Goals Patient Stated Goal: to get better OT Goal Formulation: With patient Time For Goal Achievement: 10/05/20 Potential to Achieve Goals: Good  Plan Discharge plan remains appropriate;Frequency remains appropriate       AM-PAC OT "6 Clicks" Daily Activity     Outcome Measure   Help from another person eating meals?: A Little Help from another person taking care of personal grooming?: A Little Help from another person toileting, which includes using toliet, bedpan, or urinal?: A Lot Help from another person bathing (including washing, rinsing, drying)?: A Lot Help from another person to put on and taking off regular upper body clothing?: A Little Help from another person to put on and taking off regular lower body clothing?: A Lot 6 Click Score: 15    End of Session    OT Visit Diagnosis: Unsteadiness on feet  (R26.81);Muscle weakness (generalized) (M62.81);History of falling (Z91.81);Other abnormalities of gait and mobility (R26.89);Hemiplegia and hemiparesis Hemiplegia - Right/Left: Right Hemiplegia - dominant/non-dominant: Dominant   Activity Tolerance Patient tolerated treatment well   Patient Left in bed;with bed alarm set;with call bell/phone within reach   Nurse Communication Mobility status        Time: 1510-1535 OT Time Calculation (min): 25 min  Charges: OT General Charges $OT Visit: 1 Visit OT Treatments $Therapeutic Activity: 8-22 mins $Neuromuscular Re-education: 8-22 mins  Jackquline Denmark, MS, OTR/L , CBIS ascom [phone]  09/24/20, 5:05 PM

## 2020-09-24 NOTE — Progress Notes (Signed)
Physical Therapy Treatment Patient Details Name: Lacey Gonzalez MRN: [id number] DOB: 09/27/1955 Today's Date: 09/24/2020   History of Present Illness Lacey Gonzalez is a 65 y.o. female admitted with reports of intermittent dizziness, weakness R>L side, R facial droop. 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 No hemorrhage or mass effect, Short segment occlusion of the right ACA A2 segment, Mild stenosis of the midportion of the left P2 segment, Motion degraded MRA of the neck without visible occlusion or high-grade stenosis.     PT Comments    Pt received in Semi-Fowler's position and agreeable to therapy.  Pt continues to make progress with therapy, while also still having multiple deficits when ambulating due to R sided weakness.  Pt always puts forth good effort in therapy and tries to success in all tasks.  Pt does continue to demonstrate R sided neglect with seeing soap on wall during hand washing and forgetting hand on wall railing when ambulating.  Pt requires consistent verbal and tactile cuing to be reminded of the R UE.  OT in room following conclusion of therapy session.  Current discharge plans to CIR remain appropriate at this time.  Pt will continue to benefit from skilled therapy in order to address deficits listed below.       Recommendations for follow up therapy are one component of a multi-disciplinary discharge planning process, led by the attending physician.  Recommendations may be updated based on patient status, additional functional criteria and insurance authorization.  Follow Up Recommendations  CIR     Equipment Recommendations  Rolling walker with 5" wheels    Recommendations for Other Services        Precautions / Restrictions Precautions Precautions: Fall Restrictions Weight Bearing Restrictions: No     Mobility  Bed Mobility Overal bed mobility: Needs Assistance Bed Mobility: Supine to Sit;Sit to Supine     Supine to sit: Min assist Sit to supine: Min guard   General bed mobility comments: min A for trunk support from flat bed    Transfers Overall transfer level: Needs assistance Equipment used: 1 person hand held assist Transfers: Sit to/from [employer] Sit to Stand: Mod assist Stand pivot transfers: Mod assist          Ambulation/Gait Ambulation/Gait assistance: Min guard;Min assist Gait Distance (Feet): 40 Feet Assistive device: 1 person hand held assist Gait Pattern/deviations: Step-to pattern;Decreased step length - right Gait velocity: decreased   General Gait Details: Pt continues to have difficulty with R sided-lean towards the wall and had to be corrected by therapist on multiple occasions due R sided weakness and neglect.   Stairs             Wheelchair Mobility    Modified Rankin (Stroke Patients Only)       Balance Overall balance assessment: Needs assistance Sitting-balance support: No upper extremity supported;Feet supported Sitting balance-Leahy Scale: Fair Sitting balance - Comments: Good static sitting balance at edge of bed   Standing balance support: No upper extremity supported;During functional activity Standing balance-Leahy Scale: Poor Standing balance comment: LOB to the R with functional tasks and ambulation                            Cognition Arousal/Alertness: Awake/alert  Behavior During Therapy: WFL for tasks assessed/performed Overall Cognitive Status: Within Functional Limits for tasks assessed                                 General Comments: Pt is alert and oriented. Pt needing mod - max cuing for sequencing and utilizing R UE.      Exercises      General  Comments        Pertinent Vitals/Pain Pain Assessment: No/denies pain    Home Living                      Prior Function            PT Goals (current goals can now be found in the care plan section) Acute Rehab PT Goals Patient Stated Goal: to get better PT Goal Formulation: With patient Time For Goal Achievement: 10/06/20 Potential to Achieve Goals: Good Progress towards PT goals: Progressing toward goals    Frequency    7X/week      PT Plan Current plan remains appropriate    Co-evaluation              AM-PAC PT "6 Clicks" Mobility   Outcome Measure  Help needed turning from your back to your side while in a flat bed without using bedrails?: A Lot Help needed moving from lying on your back to sitting on the side of a flat bed without using bedrails?: A Lot Help needed moving to and from a bed to a chair (including a wheelchair)?: A Lot Help needed standing up from a chair using your arms (e.g., wheelchair or bedside chair)?: A Lot Help needed to walk in hospital room?: A Lot Help needed climbing 3-5 steps with a railing? : A Lot 6 Click Score: 12    End of Session Equipment Utilized During Treatment: Gait belt Activity Tolerance: Patient tolerated treatment well;Patient limited by fatigue Patient left: in bed;with call bell/phone within reach;with bed alarm set Nurse Communication: Mobility status PT Visit Diagnosis: Unsteadiness on feet (R26.81);Other abnormalities of gait and mobility (R26.89);Muscle weakness (generalized) (M62.81);Difficulty in walking, not elsewhere classified (R26.2);Dizziness and giddiness (R42)     Time: 1444-1510 PT Time Calculation (min) (ACUTE ONLY): 26 min  Charges:  $Gait Training: 8-22 mins $Therapeutic Activity: 8-22 mins                     Nolon Bussing, PT, DPT 09/24/20, 5:59 PM    Phineas Real 09/24/2020, 5:56 PM

## 2020-09-24 NOTE — Plan of Care (Signed)
Patient is A/O X 4. Patient did report some neuropathy in feet gabapentin given per order. Patients blood sugar was 304 at bedtime administered ordered sliding scale dose.  Vital signs are stable. No further needs noted. Will continue to round as appropriate.  Problem: Skin Integrity: Goal: Risk for impaired skin integrity will decrease Outcome: Progressing   Problem: Safety: Goal: Ability to remain free from injury will improve Outcome: Progressing   Problem: Pain Managment: Goal: General experience of comfort will improve Outcome: Progressing   Problem: Coping: Goal: Level of anxiety will decrease Outcome: Progressing

## 2020-09-25 DIAGNOSIS — E1169 Type 2 diabetes mellitus with other specified complication: Secondary | ICD-10-CM | POA: Diagnosis not present

## 2020-09-25 DIAGNOSIS — E440 Moderate protein-calorie malnutrition: Secondary | ICD-10-CM | POA: Diagnosis not present

## 2020-09-25 DIAGNOSIS — N390 Urinary tract infection, site not specified: Secondary | ICD-10-CM | POA: Diagnosis not present

## 2020-09-25 DIAGNOSIS — I10 Essential (primary) hypertension: Secondary | ICD-10-CM | POA: Diagnosis not present

## 2020-09-25 LAB — GLUCOSE, CAPILLARY
Glucose-Capillary: 222 mg/dL — ABNORMAL HIGH (ref 70–99)
Glucose-Capillary: 327 mg/dL — ABNORMAL HIGH (ref 70–99)
Glucose-Capillary: 252 mg/dL — ABNORMAL HIGH (ref 70–99)
Glucose-Capillary: 208 mg/dL — ABNORMAL HIGH (ref 70–99)

## 2020-09-25 MED ORDER — INSULIN GLARGINE-YFGN 100 UNIT/ML ~~LOC~~ SOLN
5.0000 [IU] | Freq: Once | SUBCUTANEOUS | Status: AC
  Administered 2020-09-25: 5 [IU] via SUBCUTANEOUS
  Filled 2020-09-25: qty 0.05

## 2020-09-25 MED ORDER — INSULIN ASPART 100 UNIT/ML IJ SOLN
4.0000 [IU] | Freq: Three times a day (TID) | INTRAMUSCULAR | Status: DC
  Administered 2020-09-25 – 2020-09-28 (×8): 4 [IU] via SUBCUTANEOUS
  Filled 2020-09-25 (×6): qty 1

## 2020-09-25 MED ORDER — DOCUSATE SODIUM 100 MG PO CAPS
100.0000 mg | ORAL_CAPSULE | Freq: Two times a day (BID) | ORAL | Status: DC
  Administered 2020-09-25 – 2020-09-27 (×5): 100 mg via ORAL
  Filled 2020-09-25 (×5): qty 1

## 2020-09-25 MED ORDER — INSULIN GLARGINE-YFGN 100 UNIT/ML ~~LOC~~ SOLN
15.0000 [IU] | Freq: Every day | SUBCUTANEOUS | Status: DC
  Administered 2020-09-26 – 2020-09-30 (×5): 15 [IU] via SUBCUTANEOUS
  Filled 2020-09-25 (×7): qty 0.15

## 2020-09-25 NOTE — Progress Notes (Signed)
PROGRESS NOTE  Lacey Gonzalez YWV:[id number] DOB: 01-08-1955 DOA: 09/20/2020 PCP: [facility]   LOS: 5 days   Brief narrative:  65 year old female with a history of diabetes, hypertension, admitted to the hospital with dizziness, generalized weakness.  It was reported that she had right-sided facial droop, and weakness in her right arm and leg.  She was admitted for further work-up for CVA.  MRI brain confirmed acute infarct involving left thalamus and left cerebral peduncle.  Neurology following.  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  At this time, patient is on aspirin, Lipitor.  2D echocardiogram showed preserved LV function.  PT OT recommended CIR.  Currently awaiting for placement.  Will DC telemetry today.   E. coli urinary tract infection Patient to complete 3-day course of antibiotic.   diabetes mellitus type II with hyperglycemia. -Patient is on Lantus, Januvia as outpatient.  We will continue Lantus, sliding scale insulin while in the hospital.  Diabetic coordinator on board.  Will increase long-acting insulin to 15 units daily and add mealtime insulin to 4 units 3 times daily.   Diabetic gastroparesis  on Reglan.  Stable at this time.  Diabetic peripheral neuropathy. On Cymbalta daily.  Feels better after low-dose gabapentin initiated 09/24/2020  Essential hypertension Continue Avapro.  Add amlodipine for better blood pressure control.  DVT prophylaxis: enoxaparin (LOVENOX) injection 40 mg Start: 09/20/20 2015 SCD's Start: 09/20/20 1957   Code Status: Full code  Family Communication: None  Status is: Inpatient  Remains inpatient appropriate  because:Waiting for rehabilitation.  Dispo: The patient is from: Home              Anticipated d/c is to: CIR, transition of care working on it.              Patient currently is medically stable to d/c.   Difficult to place patient No   Consultants: Neurology  Procedures: None  Anti-infectives:  None.  Anti-infectives (From admission, onward)    Start     Dose/Rate Route Frequency Ordered Stop   09/23/20 1000  cephALEXin (KEFLEX) capsule 500 mg        500 mg Oral Every 12 hours 09/22/20 1302 09/23/20 2051   09/21/20 0900  cefTRIAXone (ROCEPHIN) 1 g in sodium chloride 0.9 % 100 mL IVPB  Status:  Discontinued        1 g 200 mL/hr over 30 Minutes Intravenous Every 24 hours 09/20/20 2005 09/22/20 1302   09/20/20 1615  cefTRIAXone (ROCEPHIN) 1 g in sodium chloride 0.9 % 100 mL IVPB  Status:  Discontinued        1 g 200 mL/hr over 30 Minutes Intravenous  Once 09/20/20 1600 09/22/20 1302      Subjective: Today, patient was seen and examined at bedside.  States that her tingling in the legs have slightly improved after Neurontin but does not wish to continue on the telemetry monitoring requesting the removal.  Objective: Vitals:   09/25/20 0511 09/25/20 0813  BP: (!) 171/83 (!) 142/80  Pulse: 93 98  Resp: 16   Temp: 98.7 F (37.1 C) 97.8 F (36.6 C)  SpO2: 95% 100%    Intake/Output Summary (Last 24 hours) at 09/25/2020 1200  Last data filed at 09/25/2020 0759 Gross per 24 hour  Intake --  Output 75 ml  Net -75 ml    Filed Weights   09/20/20 1206 09/21/20 0101  Weight: 67.1 kg 67.4 kg   Body mass index is 27.18 kg/m.   Physical Exam: General:  Average built, not in obvious distress HENT:   No scleral pallor or icterus noted. Oral mucosa is moist.  Chest:  Clear breath sounds.  Diminished breath sounds bilaterally. No crackles or wheezes.  CVS: S1 &S2 heard. No murmur.  Regular rate and rhythm. Abdomen: Soft, nontender, nondistended.  Bowel sounds are heard.    Extremities: No cyanosis, clubbing or edema.  Peripheral pulses are palpable. Psych: Alert, awake and oriented, normal mood CNS: Right-sided facial weakness, right-sided weakness of extremities. Skin: Warm and dry.  No rashes noted.   Data Review: I have personally reviewed the following laboratory data and studies,  CBC: Recent Labs  Lab 09/20/20 1210 09/21/20 0424 09/22/20 0425  WBC 8.5 7.0 6.4  HGB 14.8 13.7 14.1  HCT 42.4 38.8 40.6  MCV 86.0 86.0 85.8  PLT 296 265 265    Basic Metabolic Panel: Recent Labs  Lab 09/20/20 1210 09/22/20 0425  NA 131* 137  K 4.2 3.6  CL 98 104  CO2 24 27  GLUCOSE 427* 131*  BUN 21 12  CREATININE 0.73 0.56  CALCIUM 9.3 9.3    Liver Function Tests: Recent Labs  Lab 09/20/20 1210 09/22/20 0425  AST 20 20  ALT 23 17  ALKPHOS 116 101  BILITOT 0.6 0.6  PROT 6.8 6.2*  ALBUMIN 3.8 3.4*    No results for input(s): LIPASE, AMYLASE in the last 168 hours. No results for input(s): AMMONIA in the last 168 hours. Cardiac Enzymes: Recent Labs  Lab 09/21/20 0424  CKTOTAL 41    BNP (last 3 results) No results for input(s): BNP in the last 8760 hours.  ProBNP (last 3 results) No results for input(s): PROBNP in the last 8760 hours.  CBG: Recent Labs  Lab 09/24/20 0928 09/24/20 1200 09/24/20 1602 09/24/20 2046 09/25/20 0800  GLUCAP 295* 256* 179* 304* 222*    Recent Results (from the past 240 hour(s))  Urine Culture     Status: Abnormal   Collection Time: 09/20/20 12:10 PM   Specimen: Urine, Clean Catch  Result Value Ref Range Status   Specimen Description   Final    URINE, CLEAN CATCH Performed at [facility], [address], [place], [place] [address]    Special Requests   Final    NONE Performed at [facility], [address] [place], [place] [address]    Culture >=100,000 COLONIES/mL ESCHERICHIA COLI (A)  Final   Report Status 09/22/2020 FINAL  Final   Organism ID, Bacteria ESCHERICHIA  COLI (A)  Final      Susceptibility   Escherichia coli - MIC*    AMPICILLIN <=2 SENSITIVE Sensitive     CEFAZOLIN <=4 SENSITIVE Sensitive     CEFEPIME <=0.12 SENSITIVE Sensitive     CEFTRIAXONE <=0.25 SENSITIVE Sensitive     CIPROFLOXACIN <=0.25 SENSITIVE Sensitive     GENTAMICIN <=1 SENSITIVE Sensitive     IMIPENEM <=0.25 SENSITIVE Sensitive     NITROFURANTOIN <=16 SENSITIVE Sensitive     TRIMETH/SULFA <=20 SENSITIVE Sensitive     AMPICILLIN/SULBACTAM <=2 SENSITIVE Sensitive     PIP/TAZO <=4 SENSITIVE Sensitive     * >=100,000 COLONIES/mL ESCHERICHIA COLI  Resp Panel by RT-PCR (Flu  A&B, Covid) Nasopharyngeal Swab     Status: None   Collection Time: 09/20/20 10:02 PM   Specimen: Nasopharyngeal Swab; Nasopharyngeal(NP) swabs in vial transport medium  Result Value Ref Range Status   SARS Coronavirus 2 by RT PCR NEGATIVE NEGATIVE Final    Comment: (NOTE) SARS-CoV-2 target nucleic acids are NOT DETECTED.  The SARS-CoV-2 RNA is generally detectable in upper respiratory specimens during the acute phase of infection. The lowest concentration of SARS-CoV-2 viral copies this assay can detect is 138 copies/mL. A negative result does not preclude SARS-Cov-2 infection and should not be used as the sole basis for treatment or other patient management decisions. A negative result may occur with  improper specimen collection/handling, submission of specimen other than nasopharyngeal swab, presence of viral mutation(s) within the areas targeted by this assay, and inadequate number of viral copies(<138 copies/mL). A negative result must be combined with clinical observations, patient history, and epidemiological information. The expected result is Negative.  Fact Sheet for Patients:  BloggerCourse.com  Fact Sheet for Healthcare Providers:  SeriousBroker.it  This test is no t yet approved or cleared by the [place] FDA and  has been  authorized for detection and/or diagnosis of SARS-CoV-2 by FDA under an Emergency Use Authorization (EUA). This EUA will remain  in effect (meaning this test can be used) for the duration of the COVID-19 declaration under Section 564(b)(1) of the Act, 21 U.S.C.section 360bbb-3(b)(1), unless the authorization is terminated  or revoked sooner.       Influenza A by PCR NEGATIVE NEGATIVE Final   Influenza B by PCR NEGATIVE NEGATIVE Final    Comment: (NOTE) The Xpert Xpress SARS-CoV-2/FLU/RSV plus assay is intended as an aid in the diagnosis of influenza from Nasopharyngeal swab specimens and should not be used as a sole basis for treatment. Nasal washings and aspirates are unacceptable for Xpert Xpress SARS-CoV-2/FLU/RSV testing.  Fact Sheet for Patients: BloggerCourse.com  Fact Sheet for Healthcare Providers: SeriousBroker.it  This test is not yet approved or cleared by the [place] FDA and has been authorized for detection and/or diagnosis of SARS-CoV-2 by FDA under an Emergency Use Authorization (EUA). This EUA will remain in effect (meaning this test can be used) for the duration of the COVID-19 declaration under Section 564(b)(1) of the Act, 21 U.S.C. section 360bbb-3(b)(1), unless the authorization is terminated or revoked.  Performed at [facility], [address], [place], [place] [address]       Studies: No results found.    Joycelyn Das, MD  [facility] 09/25/2020  If 7PM-7AM, please contact night-coverage

## 2020-09-25 NOTE — TOC Progression Note (Signed)
Transition of Care [facility]) - Progression Note    Patient Details  Name: METTA KORANDA MRN: [id number] Date of Birth: 03/29/1955  Transition of [facility] CM/SW Contact  Caryn Section, RN Phone Number: 09/25/2020, 10:18 AM  Clinical Narrative:   Left message with [facility], awaiting return calls    Expected Discharge Plan: IP Rehab Facility Barriers to Discharge: Continued Medical Work up  Expected Discharge Plan and Services Expected Discharge Plan: IP Rehab Facility   Discharge Planning Services: CM Consult Post Acute Care Choice: IP Rehab Living arrangements for the past 2 months: Single Family Home                 DME Arranged: N/A DME Agency: NA       HH Arranged: NA HH Agency: NA         Social Determinants of Health (SDOH) Interventions    Readmission Risk Interventions No flowsheet data found.

## 2020-09-25 NOTE — Progress Notes (Addendum)
Inpatient Diabetes Program Recommendations  AACE/ADA: New Consensus Statement on Inpatient Glycemic Control (2015)  Target Ranges:  Prepandial:   less than 140 mg/dL      Peak postprandial:   less than 180 mg/dL (1-2 hours)      Critically ill patients:  140 - 180 mg/dL  Results for NIANG, MITCHELTREE (MRN [id number]) as of 09/25/2020 08:05  Ref. Range 09/24/2020 09:28 09/24/2020 12:00 09/24/2020 16:02 09/24/2020 20:46  Glucose-Capillary Latest Ref Range: 70 - 99 mg/dL 025 (H)  8 units Novolog  256 (H)  8 units Novolog  10 units Semglee 179 (H) 304 (H)  4 units Novolog   Results for ZIYONNA, CHRISTNER (MRN [id number]) as of 09/25/2020 08:05  Ref. Range 09/25/2020 08:00  Glucose-Capillary Latest Ref Range: 70 - 99 mg/dL 353 (H)     Home DM Meds: Lantus 40 units QHS     Januvia 100 mg daily  Current Orders: Semglee 10 units Daily      Novolog Moderate Correction Scale/ SSI (0-15 units) TID AC + HS     Getting Solid PO diet + Glucerna PO supps TID between meals  CBGs remain >200   MD- please consider:  1. Increase Semglee further to 15 units Daily (if 10 unit dose already given this AM, please also order Semglee 5 units X 1 to be given this AM as well)   2. Start Novolog Meal Coverage: Novolog 4 units TID with meals to start Hold if pt eats <50% of meal, Hold if pt NPO    --Will follow patient during hospitalization--  Ambrose Finland RN, MSN, CDE Diabetes Coordinator Inpatient Glycemic Control Team Team Pager: [phone] (8a-5p)

## 2020-09-25 NOTE — Progress Notes (Signed)
Occupational Therapy Treatment Patient Details Name: Lacey Gonzalez MRN: [id number] DOB: [birth year] Today's Date: 09/25/2020   History of present illness Lacey Gonzalez is a 65 y.o. female admitted with reports of intermittent dizziness, weakness R>L side, R facial droop. 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 No hemorrhage or mass effect, Short segment occlusion of the right ACA A2 segment, Mild stenosis of the midportion of the left P2 segment, Motion degraded MRA of the neck without visible occlusion or high-grade stenosis.   OT comments  Upon entering the room, pt seated on EOB and agreeable to OT intervention. Pt very pleasant and cooperative throughout. Pt given theraputty HEP for strengthening and coordination of R hand. Pt returning demonstrations as well as finger isolation exercises with effort! Pt reports, " this is kind of fun". Pt alos working on [employer], grasp, and release of items with therapist to decreased compensatory movements in R UE with task. Pt does fatigue quickly but is very motivated. OT educated pt on stroke signs, symptoms, and secondary stroke risk with pt verbalizing understanding and asking several questions. All needs within reach and theraputty left with pt for her to use over the weekend. Pt continues to benefit from OT intervention and is an excellent candidate for intensive inpatient rehab program to address functional deficits.    Recommendations for follow up therapy are one component of a multi-disciplinary discharge planning process, led by the attending physician.  Recommendations may be updated based on patient status, additional functional criteria and insurance authorization.    Follow Up  Recommendations  CIR    Equipment Recommendations  Other (comment) (defer to next venue of care)       Precautions / Restrictions Precautions Precautions: Fall Restrictions Weight Bearing Restrictions: No       Mobility Bed Mobility Overal bed mobility: Needs Assistance Bed Mobility: Supine to Sit     Supine to sit: Min assist     General bed mobility comments: Pt left sitting at EOB with tray in front of her awaiting OT.    Transfers Overall transfer level: Needs assistance Equipment used: 1 person hand held assist;Rolling walker (2 wheeled) Transfers: Sit to/from Stand Sit to Stand: Min guard         General transfer comment: CGA for safety in coming into standing    Balance Overall balance assessment: Needs assistance Sitting-balance support: No upper extremity supported;Feet supported Sitting balance-Leahy Scale: Fair Sitting balance - Comments: Good static sitting balance at edge of bed   Standing balance support: No upper extremity supported;During functional activity Standing balance-Leahy Scale: Poor Standing balance comment: LOB to the R with functional tasks and ambulation                           ADL either performed or assessed with clinical judgement      Cognition Arousal/Alertness: Awake/alert Behavior During Therapy: WFL for tasks assessed/performed Overall Cognitive Status: Within Functional Limits for tasks assessed                                 General Comments: Pt is alert and oriented. Pt needing min cuing for sequencing and utlization of R UE.  Pertinent Vitals/ Pain       Pain Assessment: No/denies pain         Frequency  Min 3X/week        Progress Toward Goals  OT Goals(current goals can now be found in the care plan section)  Progress towards OT goals: Progressing toward goals  Acute Rehab OT Goals Patient Stated Goal: to get better OT Goal Formulation: With  patient Time For Goal Achievement: 10/05/20 Potential to Achieve Goals: Good  Plan Discharge plan remains appropriate;Frequency remains appropriate       AM-PAC OT "6 Clicks" Daily Activity     Outcome Measure   Help from another person eating meals?: A Little Help from another person taking care of personal grooming?: A Little Help from another person toileting, which includes using toliet, bedpan, or urinal?: A Little Help from another person bathing (including washing, rinsing, drying)?: A Little Help from another person to put on and taking off regular upper body clothing?: A Little Help from another person to put on and taking off regular lower body clothing?: A Little 6 Click Score: 18    End of Session    OT Visit Diagnosis: Unsteadiness on feet (R26.81);Muscle weakness (generalized) (M62.81);History of falling (Z91.81);Other abnormalities of gait and mobility (R26.89);Hemiplegia and hemiparesis Hemiplegia - Right/Left: Right Hemiplegia - dominant/non-dominant: Dominant   Activity Tolerance Patient tolerated treatment well   Patient Left in bed;with bed alarm set;with call bell/phone within reach;Other (comment) (seated on EOB)   Nurse Communication Mobility status        Time: 1538-1610 OT Time Calculation (min): 32 min  Charges: OT General Charges $OT Visit: 1 Visit OT Treatments $Neuromuscular Re-education: 23-37 mins  Jackquline Denmark, MS, OTR/L , CBIS ascom [phone]  09/25/20, 4:43 PM

## 2020-09-25 NOTE — Progress Notes (Signed)
Student reported elevated BP to Malka, RN. V. Abiola Behring, RN 

## 2020-09-25 NOTE — Progress Notes (Signed)
Nutrition Follow-up  DOCUMENTATION CODES:   Non-severe (moderate) malnutrition in context of chronic illness  INTERVENTION:   -D/c Prosource Plus -D/c Ensure Max -Continue MVI with minerals daily -Continue Glucerna Shake po TID, each supplement provides 220 kcal and 10 grams of protein   NUTRITION DIAGNOSIS:   Moderate Malnutrition related to chronic illness (uncontrolled T2DM) as evidenced by mild fat depletion, mild muscle depletion.  Ongoing  GOAL:   Patient will meet greater than or equal to 90% of their needs  Progressing   MONITOR:   PO intake, Supplement acceptance, Labs, Weight trends, I & O's  REASON FOR ASSESSMENT:   Malnutrition Screening Tool    ASSESSMENT:   65 yo female with a PMH of diabetes on Januvia and Lantus, gallstone, DDD, hypertension on Vasotec and recently started on Benicar, anxiety/depression Cymbalta who presented with dizziness and generalized weakness, dysuria. Admitted with CVA.  Reviewed I/O's: -75 ml x 24 hours and +2 L since admission  UOP: 75 ml x 24 hours   Spoke with pt at bedside, who reports feeling better today. She has a very positive attitude and reports she gets better and stronger daily. She is hoping to transfer to a rehab center soon. Per TOC notes, awaiting placement.   Pt reports improved appetite. She shares that she is eating most of her meals and enjoys the food options. Noted meal completions 30-100%. Pt likes the Glucerna shakes, however, dislikes Prosource and Ensure Max. Will d/c secondary to improved oral intake and poor tolerance.   Discussed importance of good meal and supplement intake to promote healing. RD also provided emotional support.   Medications reviewed and include colace.  Labs reviewed: CBGS: 179-327 (inpatient orders for glycemic control are 0-15 units insulin aspart TID with meals and at bedtime, 0-5 units insulin aspart daily at bedtime, 4 units insulin aspart TID with meals, and 15 units  insulin glargine-yfgn).    Diet Order:   Diet Order             Diet Carb Modified Fluid consistency: Thin; Room service appropriate? Yes with Assist  Diet effective now                   EDUCATION NEEDS:   Education needs have been addressed  Skin:  Skin Assessment: Reviewed RN Assessment  Last BM:  09/24/20  Height:   Ht Readings from Last 1 Encounters:  09/21/20 5\' 2"  (1.575 m)    Weight:   Wt Readings from Last 1 Encounters:  09/21/20 67.4 kg   BMI:  Body mass index is 27.18 kg/m.  Estimated Nutritional Needs:   Kcal:  1650-1850  Protein:  85-100 grams  Fluid:  > 1.6 L    09/23/20, RD, LDN, CDCES Registered Dietitian II Certified Diabetes Care and Education Specialist Please refer to [facility] for RD and/or RD on-call/weekend/after hours pager

## 2020-09-25 NOTE — Plan of Care (Signed)
Patient is A/O X 4. Patient did not report any pain at this time.  Vital signs are stable. No further needs noted. Will continue to round as appropriate.  Problem: Skin Integrity: Goal: Risk for impaired skin integrity will decrease Outcome: Progressing   Problem: Safety: Goal: Ability to remain free from injury will improve Outcome: Progressing   Problem: Pain Managment: Goal: General experience of comfort will improve Outcome: Progressing   Problem: Coping: Goal: Level of anxiety will decrease Outcome: Progressing

## 2020-09-25 NOTE — Progress Notes (Signed)
Physical Therapy Treatment Patient Details Name: Lacey Gonzalez MRN: [id number] DOB: [birth year] Today's Date: 09/25/2020   History of Present Illness Lacey Gonzalez is a 65 y.o. female admitted with reports of intermittent dizziness, weakness R>L side, R facial droop. 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 No hemorrhage or mass effect, Short segment occlusion of the right ACA A2 segment, Mild stenosis of the midportion of the left P2 segment, Motion degraded MRA of the neck without visible occlusion or high-grade stenosis.     PT Comments    Pt received in Semi-Fowler's position and agreeable to therapy.  Pt eager and ready to attempt therapy today.  Pt performing much better today with R handed tasks and is making conscious effort to utilize the R UE more.  Pt still having some stumbles when ambulating, however is making significant progress with looking to the R and having a conversation when therapist is on the R side of pt.  Pt able to go to the bathroom as well with assistance and functionally wash hands with soap.  Pt requiring much less verbal cuing this date in therapy.  Current discharge plans to CIR remain appropriate at this time.  Pt will continue to benefit from skilled therapy in order to address deficits listed below.       Recommendations for follow up therapy are one component of a multi-disciplinary discharge planning process, led by the attending physician.  Recommendations may be updated based on patient status, additional functional criteria and insurance authorization.  Follow Up Recommendations  CIR     Equipment Recommendations  Rolling walker with 5" wheels    Recommendations for Other Services        Precautions / Restrictions Precautions Precautions: Fall Restrictions Weight Bearing Restrictions: No     Mobility  Bed Mobility Overal bed mobility: Needs Assistance Bed Mobility: Supine to Sit     Supine to sit: Min assist     General bed mobility comments: Pt left sitting at EOB with tray in front of her awaiting OT.    Transfers Overall transfer level: Needs assistance Equipment used: 1 person hand held assist;Rolling walker (2 wheeled) Transfers: Sit to/from Stand Sit to Stand: Min guard         General transfer comment: CGA for safety in coming into standing  Ambulation/Gait Ambulation/Gait assistance: Min guard;Min assist Gait Distance (Feet): 50 Feet Assistive device: 1 person hand held assist;Rolling walker (2 wheeled) Gait Pattern/deviations: Step-to pattern;Decreased step length - right Gait velocity: decreased   General Gait Details: Pt able to ambulate 50 ft with R sided HHA and 50 feet with FWW and utilizing head turns to the right to challenge balance.   Stairs             Wheelchair Mobility    Modified Rankin (Stroke Patients Only)       Balance Overall balance assessment: Needs assistance Sitting-balance support: No upper extremity supported;Feet supported Sitting balance-Leahy Scale: Fair Sitting balance - Comments: Good static sitting balance at edge of bed   Standing balance support: No upper extremity supported;During functional activity Standing balance-Leahy Scale: Poor Standing balance comment: LOB to the R with functional tasks and ambulation  Cognition Arousal/Alertness: Awake/alert Behavior During Therapy: WFL for tasks assessed/performed Overall Cognitive Status: Within Functional Limits for tasks assessed                                 General Comments: Pt is alert and oriented. Pt needing min-mod cuing for sequencing and utlization of R UE.      Exercises       General Comments        Pertinent Vitals/Pain Pain Assessment: No/denies pain    Home Living                      Prior Function            PT Goals (current goals can now be found in the care plan section) Acute Rehab PT Goals Patient Stated Goal: to get better PT Goal Formulation: With patient Time For Goal Achievement: 10/06/20 Potential to Achieve Goals: Good Progress towards PT goals: Progressing toward goals    Frequency    7X/week      PT Plan Current plan remains appropriate    Co-evaluation              AM-PAC PT "6 Clicks" Mobility   Outcome Measure  Help needed turning from your back to your side while in a flat bed without using bedrails?: A Lot Help needed moving from lying on your back to sitting on the side of a flat bed without using bedrails?: A Lot Help needed moving to and from a bed to a chair (including a wheelchair)?: A Lot Help needed standing up from a chair using your arms (e.g., wheelchair or bedside chair)?: A Lot Help needed to walk in hospital room?: A Lot Help needed climbing 3-5 steps with a railing? : A Lot 6 Click Score: 12    End of Session Equipment Utilized During Treatment: Gait belt Activity Tolerance: Patient tolerated treatment well;Patient limited by fatigue Patient left: in bed;with call bell/phone within reach;with bed alarm set Nurse Communication: Mobility status PT Visit Diagnosis: Unsteadiness on feet (R26.81);Other abnormalities of gait and mobility (R26.89);Muscle weakness (generalized) (M62.81);Difficulty in walking, not elsewhere classified (R26.2);Dizziness and giddiness (R42)     Time: 9390-3009 PT Time Calculation (min) (ACUTE ONLY): 40 min  Charges:  $Gait Training: 8-22 mins $Neuromuscular Re-education: 23-37 mins                     Nolon Bussing, PT, DPT 09/25/20, 3:58 PM    Lacey Gonzalez 09/25/2020, 3:55 PM

## 2020-09-26 DIAGNOSIS — E1169 Type 2 diabetes mellitus with other specified complication: Secondary | ICD-10-CM | POA: Diagnosis not present

## 2020-09-26 DIAGNOSIS — I10 Essential (primary) hypertension: Secondary | ICD-10-CM | POA: Diagnosis not present

## 2020-09-26 DIAGNOSIS — N390 Urinary tract infection, site not specified: Secondary | ICD-10-CM | POA: Diagnosis not present

## 2020-09-26 DIAGNOSIS — E440 Moderate protein-calorie malnutrition: Secondary | ICD-10-CM | POA: Diagnosis not present

## 2020-09-26 LAB — BASIC METABOLIC PANEL
Anion gap: 3 — ABNORMAL LOW (ref 5–15)
BUN: 22 mg/dL (ref 8–23)
CO2: 27 mmol/L (ref 22–32)
Calcium: 9.4 mg/dL (ref 8.9–10.3)
Chloride: 105 mmol/L (ref 98–111)
Creatinine, Ser: 0.59 mg/dL (ref 0.44–1.00)
GFR, Estimated: >60 mL/min (ref >60)
Glucose, Bld: 348 mg/dL — ABNORMAL HIGH (ref 70–99)
Potassium: 4.2 mmol/L (ref 3.5–5.1)
Sodium: 135 mmol/L (ref 135–145)

## 2020-09-26 LAB — GLUCOSE, CAPILLARY
Glucose-Capillary: 289 mg/dL — ABNORMAL HIGH (ref 70–99)
Glucose-Capillary: 251 mg/dL — ABNORMAL HIGH (ref 70–99)
Glucose-Capillary: 114 mg/dL — ABNORMAL HIGH (ref 70–99)
Glucose-Capillary: 247 mg/dL — ABNORMAL HIGH (ref 70–99)

## 2020-09-26 MED ORDER — FUROSEMIDE 20 MG PO TABS
20.0000 mg | ORAL_TABLET | Freq: Every day | ORAL | Status: DC
  Administered 2020-09-26 – 2020-10-01 (×6): 20 mg via ORAL
  Filled 2020-09-26 (×6): qty 1

## 2020-09-26 NOTE — Progress Notes (Signed)
PROGRESS NOTE  Lacey Gonzalez CNO:[id number] DOB: 1955-06-07 DOA: 09/20/2020 PCP: [facility]   LOS: 6 days   Brief narrative:  65 year old female with a history of diabetes, hypertension, admitted to the hospital with dizziness, generalized weakness.  It was reported that she had right-sided facial droop, and weakness in her right arm and leg.  She was admitted for further work-up for CVA.  MRI brain confirmed acute infarct involving left thalamus and left cerebral peduncle.  Neurology following.  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  At this time, patient is on aspirin, Lipitor.  2D echocardiogram showed preserved LV function.  PT OT recommended CIR.  Currently awaiting for placement.  Will DC telemetry today.   E. coli urinary tract infection Patient to complete 3-day course of antibiotic.   diabetes mellitus type II with hyperglycemia. -Patient is on Lantus, Januvia as outpatient.  We will continue Lantus, sliding scale insulin while in the hospital.  Diabetic coordinator on board.  Will increase long-acting insulin to 15 units daily and add mealtime insulin to 4 units 3 times daily.   Diabetic gastroparesis  on Reglan.  Stable at this time.  Diabetic peripheral neuropathy. On Cymbalta daily.  Feels better after low-dose gabapentin initiated 09/24/2020  Essential hypertension Continue Avapro.  Add amlodipine for better blood pressure control.  DVT prophylaxis: enoxaparin (LOVENOX) injection 40 mg Start: 09/20/20 2015 SCD's Start: 09/20/20 1957   Code Status: Full code  Family Communication: None  Status is: Inpatient  Remains inpatient appropriate  because:Waiting for rehabilitation.  Dispo: The patient is from: Home              Anticipated d/c is to: CIR, transition of care working on it.              Patient currently is medically stable to d/c.   Difficult to place patient No   Consultants: Neurology  Procedures: None  Anti-infectives:  None.  Anti-infectives (From admission, onward)    Start     Dose/Rate Route Frequency Ordered Stop   09/23/20 1000  cephALEXin (KEFLEX) capsule 500 mg        500 mg Oral Every 12 hours 09/22/20 1302 09/23/20 2051   09/21/20 0900  cefTRIAXone (ROCEPHIN) 1 g in sodium chloride 0.9 % 100 mL IVPB  Status:  Discontinued        1 g 200 mL/hr over 30 Minutes Intravenous Every 24 hours 09/20/20 2005 09/22/20 1302   09/20/20 1615  cefTRIAXone (ROCEPHIN) 1 g in sodium chloride 0.9 % 100 mL IVPB  Status:  Discontinued        1 g 200 mL/hr over 30 Minutes Intravenous  Once 09/20/20 1600 09/22/20 1302      Subjective: Today, patient was seen and examined at bedside.  Complains of left leg swelling and wants a diuretic.  Objective: Vitals:   09/26/20 0337 09/26/20 0742  BP: (!) 172/80 (!) 150/77  Pulse: 95 98  Resp: 16 16  Temp: 98.8 F (37.1 C) 98 F (36.7 C)  SpO2: 100% 98%   No intake or output data in the 24 hours ending 09/26/20 0950  Filed Weights   09/20/20 1206 09/21/20 0101  Weight: 67.1 kg  67.4 kg   Body mass index is 27.18 kg/m.   Physical Exam: General:  Average built, not in obvious distress HENT:   No scleral pallor or icterus noted. Oral mucosa is moist.  Chest:  Clear breath sounds.  Diminished breath sounds bilaterally. No crackles or wheezes.  CVS: S1 &S2 heard. No murmur.  Regular rate and rhythm. Abdomen: Soft, nontender, nondistended.  Bowel sounds are heard.   Extremities: No cyanosis, clubbing but left lower extremity venous insufficiency..  Peripheral pulses are palpable. Psych: Alert, awake and oriented, normal mood CNS: Right-sided facial weakness,  right-sided weakness of extremities. Skin: Warm and dry.  No rashes noted.   Data Review: I have personally reviewed the following laboratory data and studies,  CBC: Recent Labs  Lab 09/20/20 1210 09/21/20 0424 09/22/20 0425  WBC 8.5 7.0 6.4  HGB 14.8 13.7 14.1  HCT 42.4 38.8 40.6  MCV 86.0 86.0 85.8  PLT 296 265 265    Basic Metabolic Panel: Recent Labs  Lab 09/20/20 1210 09/22/20 0425 09/26/20 0418  NA 131* 137 135  K 4.2 3.6 4.2  CL 98 104 105  CO2 24 27 27   GLUCOSE 427* 131* 348*  BUN 21 12 22   CREATININE 0.73 0.56 0.59  CALCIUM 9.3 9.3 9.4    Liver Function Tests: Recent Labs  Lab 09/20/20 1210 09/22/20 0425  AST 20 20  ALT 23 17  ALKPHOS 116 101  BILITOT 0.6 0.6  PROT 6.8 6.2*  ALBUMIN 3.8 3.4*    No results for input(s): LIPASE, AMYLASE in the last 168 hours. No results for input(s): AMMONIA in the last 168 hours. Cardiac Enzymes: Recent Labs  Lab 09/21/20 0424  CKTOTAL 41    BNP (last 3 results) No results for input(s): BNP in the last 8760 hours.  ProBNP (last 3 results) No results for input(s): PROBNP in the last 8760 hours.  CBG: Recent Labs  Lab 09/25/20 0800 09/25/20 1329 09/25/20 1539 09/25/20 2053 09/26/20 0815  GLUCAP 222* 327* 252* 208* 289*    Recent Results (from the past 240 hour(s))  Urine Culture     Status: Abnormal   Collection Time: 09/20/20 12:10 PM   Specimen: Urine, Clean Catch  Result Value Ref Range Status   Specimen Description   Final    URINE, CLEAN CATCH Performed at [facility], [address] [place], [address] [place]    Special Requests   Final    NONE Performed at [facility], [address] [place], [address] [place]    Culture >=100,000 COLONIES/mL ESCHERICHIA COLI (A)  Final   Report Status 09/22/2020 FINAL  Final   Organism ID, Bacteria ESCHERICHIA COLI (A)  Final      Susceptibility   Escherichia coli - MIC*    AMPICILLIN <=2 SENSITIVE Sensitive      CEFAZOLIN <=4 SENSITIVE Sensitive     CEFEPIME <=0.12 SENSITIVE Sensitive     CEFTRIAXONE <=0.25 SENSITIVE Sensitive     CIPROFLOXACIN <=0.25 SENSITIVE Sensitive     GENTAMICIN <=1 SENSITIVE Sensitive     IMIPENEM <=0.25 SENSITIVE Sensitive     NITROFURANTOIN <=16 SENSITIVE Sensitive     TRIMETH/SULFA <=20 SENSITIVE Sensitive     AMPICILLIN/SULBACTAM <=2 SENSITIVE Sensitive     PIP/TAZO <=4 SENSITIVE Sensitive     * >=100,000 COLONIES/mL ESCHERICHIA COLI  Resp Panel by RT-PCR (Flu A&B, Covid) Nasopharyngeal Swab     Status: None   Collection Time: 09/20/20 10:02 PM   Specimen: Nasopharyngeal Swab;  Nasopharyngeal(NP) swabs in vial transport medium  Result Value Ref Range Status   SARS Coronavirus 2 by RT PCR NEGATIVE NEGATIVE Final    Comment: (NOTE) SARS-CoV-2 target nucleic acids are NOT DETECTED.  The SARS-CoV-2 RNA is generally detectable in upper respiratory specimens during the acute phase of infection. The lowest concentration of SARS-CoV-2 viral copies this assay can detect is 138 copies/mL. A negative result does not preclude SARS-Cov-2 infection and should not be used as the sole basis for treatment or other patient management decisions. A negative result may occur with  improper specimen collection/handling, submission of specimen other than nasopharyngeal swab, presence of viral mutation(s) within the areas targeted by this assay, and inadequate number of viral copies(<138 copies/mL). A negative result must be combined with clinical observations, patient history, and epidemiological information. The expected result is Negative.  Fact Sheet for Patients:  BloggerCourse.com  Fact Sheet for Healthcare Providers:  SeriousBroker.it  This test is no t yet approved or cleared by the [place] FDA and  has been authorized for detection and/or diagnosis of SARS-CoV-2 by FDA under an Emergency Use Authorization (EUA). This  EUA will remain  in effect (meaning this test can be used) for the duration of the COVID-19 declaration under Section 564(b)(1) of the Act, 21 U.S.C.section 360bbb-3(b)(1), unless the authorization is terminated  or revoked sooner.       Influenza A by PCR NEGATIVE NEGATIVE Final   Influenza B by PCR NEGATIVE NEGATIVE Final    Comment: (NOTE) The Xpert Xpress SARS-CoV-2/FLU/RSV plus assay is intended as an aid in the diagnosis of influenza from Nasopharyngeal swab specimens and should not be used as a sole basis for treatment. Nasal washings and aspirates are unacceptable for Xpert Xpress SARS-CoV-2/FLU/RSV testing.  Fact Sheet for Patients: BloggerCourse.com  Fact Sheet for Healthcare Providers: SeriousBroker.it  This test is not yet approved or cleared by the [place] FDA and has been authorized for detection and/or diagnosis of SARS-CoV-2 by FDA under an Emergency Use Authorization (EUA). This EUA will remain in effect (meaning this test can be used) for the duration of the COVID-19 declaration under Section 564(b)(1) of the Act, 21 U.S.C. section 360bbb-3(b)(1), unless the authorization is terminated or revoked.  Performed at [facility], [address], [place], [place] [address]       Studies: No results found.    Joycelyn Das, MD  [facility] 09/26/2020  If 7PM-7AM, please contact night-coverage

## 2020-09-26 NOTE — Progress Notes (Signed)
Physical Therapy Treatment Patient Details Name: Lacey Gonzalez MRN: [id number] DOB: [birth year] Today's Date: 09/26/2020   History of Present Illness Lacey Gonzalez is a 65 y.o. female admitted with reports of intermittent dizziness, weakness R>L side, R facial droop. 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 No hemorrhage or mass effect, Short segment occlusion of the right ACA A2 segment, Mild stenosis of the midportion of the left P2 segment, Motion degraded MRA of the neck without visible occlusion or high-grade stenosis.    PT Comments    Pt seen for PT tx with pt c/o significant fatigue on this date, with pt reporting she didn't sleep well last night. Pt does need to use restroom so focused on gait to/from bathroom with RUE HHA with pt requiring mod<>max assist with pt demonstrating frequent LOB, difficulty not reaching for LUE support despite cuing, and impaired gait as noted below. Pt with continent void & requires min/mod assist for sit>stand from toilet. Pt would very much benefit from intensive rehab services upon d/c to focus on R attention, R NMR, standing balance, endurance & gait with LRAD. Pt may need R AFO in the future. Will continue to follow acutely to progress mobility as able.    Recommendations for follow up therapy are one component of a multi-disciplinary discharge planning process, led by the attending physician.  Recommendations may be updated based on patient status, additional functional criteria and insurance authorization.  Follow Up Recommendations  CIR     Equipment Recommendations   (TBD in next venue)    Recommendations for Other Services       Precautions / Restrictions Precautions Precautions:  Fall Precaution Comments: R hemi Restrictions Weight Bearing Restrictions: No     Mobility  Bed Mobility               General bed mobility comments: not observed, pt received & left sitting in recliner    Transfers Overall transfer level: Needs assistance Equipment used: 1 person hand held assist Transfers: Sit to/from Stand Sit to Stand: Mod assist         General transfer comment: sit<>stand from recliner & toilet with use of grab bar  Ambulation/Gait Ambulation/Gait assistance: Mod assist;Max assist Gait Distance (Feet): 20 Feet (+ 20 ft) Assistive device: 1 person hand held assist Gait Pattern/deviations: Decreased step length - right;Decreased dorsiflexion - right;Decreased stride length;Decreased weight shift to right Gait velocity: decreased   General Gait Details: Pt ambulates recliner>bathroom & back with RUE HHA & mod<>max assist with pt holding to furniture with LUE despite max cuing not to. Pt with decreased RLE hip/knee flexion during swing phase, decreased foot clearance & heel strike. Pt with multiple LOB 2/2 impaired gait with RLE. Pt also impulsive with mobility & not following commands/not attending to tasks at hand. Pt frequently c/o fatigue & not feeling up to it today.   Stairs             Wheelchair Mobility    Modified Rankin (Stroke Patients Only)       Balance Overall balance assessment: Needs assistance Sitting-balance support: No upper extremity supported;Feet supported Sitting balance-Leahy Scale: Fair     Standing balance support: Single extremity supported;During functional activity Standing balance-Leahy Scale: Poor  Cognition Arousal/Alertness: Awake/alert Behavior During Therapy: Impulsive;Restless Overall Cognitive Status: Difficult to assess                                 General Comments: Irritable on this date, pt c/o being tired 2/2 not sleeping well last  night      Exercises      General Comments General comments (skin integrity, edema, etc.): Pt with continent void during session, cuing to locate soap on R 2/2 R inattention but pt reports she doesn't want to use any when washing her hands.      Pertinent Vitals/Pain Pain Assessment: No/denies pain    Home Living                      Prior Function            PT Goals (current goals can now be found in the care plan section) Acute Rehab PT Goals Patient Stated Goal: to get better PT Goal Formulation: With patient Time For Goal Achievement: 10/06/20 Potential to Achieve Goals: Good Progress towards PT goals: Progressing toward goals    Frequency    7X/week      PT Plan Current plan remains appropriate    Co-evaluation              AM-PAC PT "6 Clicks" Mobility   Outcome Measure  Help needed turning from your back to your side while in a flat bed without using bedrails?: A Little Help needed moving from lying on your back to sitting on the side of a flat bed without using bedrails?: A Lot Help needed moving to and from a bed to a chair (including a wheelchair)?: A Lot Help needed standing up from a chair using your arms (e.g., wheelchair or bedside chair)?: A Lot Help needed to walk in hospital room?: Total Help needed climbing 3-5 steps with a railing? : Total 6 Click Score: 11    End of Session Equipment Utilized During Treatment: Gait belt Activity Tolerance: Patient limited by fatigue Patient left: in chair;with chair alarm set;with call bell/phone within reach Nurse Communication: Mobility status (irritability, gauze on L wrist appearing bloody (unsure if this is new or old)) PT Visit Diagnosis: Unsteadiness on feet (R26.81);Other abnormalities of gait and mobility (R26.89);Muscle weakness (generalized) (M62.81);Difficulty in walking, not elsewhere classified (R26.2);Dizziness and giddiness (R42)     Time: 4742-5956 PT Time Calculation  (min) (ACUTE ONLY): 9 min  Charges:  $Neuromuscular Re-education: 8-22 mins                     Aleda Grana, PT, DPT 09/26/20, 12:14 PM    Sandi Mariscal 09/26/2020, 12:12 PM

## 2020-09-27 DIAGNOSIS — N390 Urinary tract infection, site not specified: Secondary | ICD-10-CM | POA: Diagnosis not present

## 2020-09-27 DIAGNOSIS — I10 Essential (primary) hypertension: Secondary | ICD-10-CM | POA: Diagnosis not present

## 2020-09-27 DIAGNOSIS — E440 Moderate protein-calorie malnutrition: Secondary | ICD-10-CM | POA: Diagnosis not present

## 2020-09-27 DIAGNOSIS — E1169 Type 2 diabetes mellitus with other specified complication: Secondary | ICD-10-CM | POA: Diagnosis not present

## 2020-09-27 LAB — GLUCOSE, CAPILLARY
Glucose-Capillary: 286 mg/dL — ABNORMAL HIGH (ref 70–99)
Glucose-Capillary: 333 mg/dL — ABNORMAL HIGH (ref 70–99)
Glucose-Capillary: 148 mg/dL — ABNORMAL HIGH (ref 70–99)
Glucose-Capillary: 218 mg/dL — ABNORMAL HIGH (ref 70–99)

## 2020-09-27 LAB — CREATININE, SERUM
Creatinine, Ser: 0.68 mg/dL (ref 0.44–1.00)
GFR, Estimated: >60 mL/min (ref >60)

## 2020-09-27 MED ORDER — POLYETHYLENE GLYCOL 3350 17 G PO PACK: 17.0000 g | PACK | Freq: Every day | ORAL | Status: DC

## 2020-09-27 MED ORDER — INSULIN ASPART 100 UNIT/ML IJ SOLN
0.0000 [IU] | Freq: Three times a day (TID) | INTRAMUSCULAR | Status: DC
  Administered 2020-09-27: 18:00:00 2 [IU] via SUBCUTANEOUS
  Administered 2020-09-27: 13:00:00 11 [IU] via SUBCUTANEOUS
  Administered 2020-09-28 (×2): 3 [IU] via SUBCUTANEOUS
  Administered 2020-09-28: 11 [IU] via SUBCUTANEOUS
  Administered 2020-09-29: 5 [IU] via SUBCUTANEOUS
  Administered 2020-09-29: 3 [IU] via SUBCUTANEOUS
  Administered 2020-09-29: 5 [IU] via SUBCUTANEOUS
  Administered 2020-09-30: 8 [IU] via SUBCUTANEOUS
  Administered 2020-09-30: 5 [IU] via SUBCUTANEOUS
  Administered 2020-09-30: 8 [IU] via SUBCUTANEOUS
  Administered 2020-10-01: 17:00:00 5 [IU] via SUBCUTANEOUS
  Administered 2020-10-01: 15 [IU] via SUBCUTANEOUS
  Administered 2020-10-01: 13:00:00 8 [IU] via SUBCUTANEOUS
  Filled 2020-09-27 (×12): qty 1

## 2020-09-27 MED ORDER — SENNOSIDES-DOCUSATE SODIUM 8.6-50 MG PO TABS
2.0000 | ORAL_TABLET | Freq: Two times a day (BID) | ORAL | Status: DC
  Administered 2020-09-27 – 2020-10-01 (×8): 2 via ORAL
  Filled 2020-09-27 (×9): qty 2

## 2020-09-27 MED ORDER — POLYETHYLENE GLYCOL 3350 17 G PO PACK
17.0000 g | PACK | Freq: Every day | ORAL | Status: DC
  Administered 2020-09-27 – 2020-10-01 (×5): 17 g via ORAL
  Filled 2020-09-27 (×5): qty 1

## 2020-09-27 MED ORDER — INSULIN ASPART 100 UNIT/ML IJ SOLN
0.0000 [IU] | Freq: Every day | INTRAMUSCULAR | Status: DC
Start: 2020-09-27 — End: 2020-10-01
  Administered 2020-09-27 – 2020-09-29 (×2): 2 [IU] via SUBCUTANEOUS
  Filled 2020-09-27 (×2): qty 1

## 2020-09-27 MED ORDER — ONDANSETRON 4 MG PO TBDP
4.0000 mg | ORAL_TABLET | Freq: Four times a day (QID) | ORAL | Status: DC | PRN
  Administered 2020-09-27 – 2020-10-01 (×3): 4 mg via ORAL
  Filled 2020-09-27 (×6): qty 1

## 2020-09-27 NOTE — Progress Notes (Signed)
PROGRESS NOTE  Lacey Gonzalez FIE:[id number] DOB: November 24, 1955 DOA: 09/20/2020 PCP: [facility]   LOS: 7 days   Brief narrative:  65 year old female with a history of diabetes, hypertension, admitted to the hospital with dizziness, generalized weakness.  It was reported that she had right-sided facial droop, and weakness in her right arm and leg.  She was admitted for further work-up for CVA.  MRI brain confirmed acute infarct involving left thalamus and left cerebral peduncle.  Neurology saw the patient during hospitalization.  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  Continue aspirin, Lipitor.  2D echocardiogram showed preserved LV function.  PT OT recommended CIR.  Currently awaiting for placement.  Off telemetry    E. coli urinary tract infection Completed 3-day course of antibiotic.   Diabetes mellitus type II with hyperglycemia. -Patient is on Lantus, Januvia as outpatient.  We will continue Lantus, sliding scale insulin while in the hospital.  Diabetic coordinator on board.  Will increase long-acting insulin to 15 units daily and add mealtime insulin to 4 units 3 times daily.   Diabetic gastroparesis Of Reglan at this time stable   Diabetic peripheral neuropathy. On Cymbalta, and low-dose gabapentin initiated 09/24/2020  Essential hypertension Continue Avapro, amlodipine.  DVT prophylaxis: enoxaparin (LOVENOX) injection 40 mg Start: 09/20/20 2015 SCD's Start: 09/20/20 1957   Code Status: Full code  Family Communication: None  Status is: Inpatient  Remains inpatient appropriate because:Waiting for rehabilitation.  Dispo: The patient is from: Home               Anticipated d/c is to: CIR, transition of care on board.              Patient currently is medically stable to d/c.   Difficult to place patient No   Consultants: Neurology  Procedures: None  Anti-infectives:  None.  Anti-infectives (From admission, onward)    Start     Dose/Rate Route Frequency Ordered Stop   09/23/20 1000  cephALEXin (KEFLEX) capsule 500 mg        500 mg Oral Every 12 hours 09/22/20 1302 09/23/20 2051   09/21/20 0900  cefTRIAXone (ROCEPHIN) 1 g in sodium chloride 0.9 % 100 mL IVPB  Status:  Discontinued        1 g 200 mL/hr over 30 Minutes Intravenous Every 24 hours 09/20/20 2005 09/22/20 1302   09/20/20 1615  cefTRIAXone (ROCEPHIN) 1 g in sodium chloride 0.9 % 100 mL IVPB  Status:  Discontinued        1 g 200 mL/hr over 30 Minutes Intravenous  Once 09/20/20 1600 09/22/20 1302      Subjective: Today, patient was seen and examined at bedside.  Denies interval complaints.  Denies nausea vomiting shortness of breath cough fever. Objective: Vitals:   09/27/20 0439 09/27/20 0843  BP: (!) 144/67 (!) 148/74  Pulse: 93 100  Resp: 18 16  Temp: 98 F (36.7 C) 98.7 F (37.1 C)  SpO2: 98% 97%    Intake/Output Summary (Last 24 hours) at 09/27/2020 1013 Last data filed at 09/26/2020 1855 Gross per 24 hour  Intake 360 ml  Output --  Net 360 ml    Filed Weights   09/20/20 1206 09/21/20  0101  Weight: 67.1 kg 67.4 kg   Body mass index is 27.18 kg/m.   Physical Exam: General:  Average built, not in obvious distress, alert awake oriented HENT:   No scleral pallor or icterus noted. Oral mucosa is moist.  Chest:  Clear breath sounds.  Diminished breath sounds bilaterally. No crackles or wheezes.  CVS: S1 &S2 heard. No murmur.  Regular rate and rhythm. Abdomen: Soft, nontender, nondistended.  Bowel sounds are heard.   Extremities: No cyanosis, clubbing but left lower extremity venous insufficiency..  Peripheral pulses are palpable. Psych: Alert,  awake and oriented, normal mood CNS: Residual right-sided facial weakness and right-sided weakness of the extremities  Skin: Warm and dry.  No rashes noted.   Data Review: I have personally reviewed the following laboratory data and studies,  CBC: Recent Labs  Lab 09/20/20 1210 09/21/20 0424 09/22/20 0425  WBC 8.5 7.0 6.4  HGB 14.8 13.7 14.1  HCT 42.4 38.8 40.6  MCV 86.0 86.0 85.8  PLT 296 265 265    Basic Metabolic Panel: Recent Labs  Lab 09/20/20 1210 09/22/20 0425 09/26/20 0418 09/27/20 0513  NA 131* 137 135  --   K 4.2 3.6 4.2  --   CL 98 104 105  --   CO2 24 27 27   --   GLUCOSE 427* 131* 348*  --   BUN 21 12 22   --   CREATININE 0.73 0.56 0.59 0.68  CALCIUM 9.3 9.3 9.4  --     Liver Function Tests: Recent Labs  Lab 09/20/20 1210 09/22/20 0425  AST 20 20  ALT 23 17  ALKPHOS 116 101  BILITOT 0.6 0.6  PROT 6.8 6.2*  ALBUMIN 3.8 3.4*    No results for input(s): LIPASE, AMYLASE in the last 168 hours. No results for input(s): AMMONIA in the last 168 hours. Cardiac Enzymes: Recent Labs  Lab 09/21/20 0424  CKTOTAL 41    BNP (last 3 results) No results for input(s): BNP in the last 8760 hours.  ProBNP (last 3 results) No results for input(s): PROBNP in the last 8760 hours.  CBG: Recent Labs  Lab 09/26/20 0815 09/26/20 1137 09/26/20 1615 09/26/20 2027 09/27/20 0842  GLUCAP 289* 251* 114* 247* 286*    Recent Results (from the past 240 hour(s))  Urine Culture     Status: Abnormal   Collection Time: 09/20/20 12:10 PM   Specimen: Urine, Clean Catch  Result Value Ref Range Status   Specimen Description   Final    URINE, CLEAN CATCH Performed at [facility], [address], [place], [address] [place]    Special Requests   Final    NONE Performed at [facility], [address] [place], [address] [place]    Culture >=100,000 COLONIES/mL ESCHERICHIA COLI (A)  Final   Report Status 09/22/2020 FINAL  Final   Organism ID,  Bacteria ESCHERICHIA COLI (A)  Final      Susceptibility   Escherichia coli - MIC*    AMPICILLIN <=2 SENSITIVE Sensitive     CEFAZOLIN <=4 SENSITIVE Sensitive     CEFEPIME <=0.12 SENSITIVE Sensitive     CEFTRIAXONE <=0.25 SENSITIVE Sensitive     CIPROFLOXACIN <=0.25 SENSITIVE Sensitive     GENTAMICIN <=1 SENSITIVE Sensitive     IMIPENEM <=0.25 SENSITIVE Sensitive     NITROFURANTOIN <=16 SENSITIVE Sensitive     TRIMETH/SULFA <=20 SENSITIVE Sensitive     AMPICILLIN/SULBACTAM <=2 SENSITIVE Sensitive     PIP/TAZO <=4 SENSITIVE Sensitive     * >=  100,000 COLONIES/mL ESCHERICHIA COLI  Resp Panel by RT-PCR (Flu A&B, Covid) Nasopharyngeal Swab     Status: None   Collection Time: 09/20/20 10:02 PM   Specimen: Nasopharyngeal Swab; Nasopharyngeal(NP) swabs in vial transport medium  Result Value Ref Range Status   SARS Coronavirus 2 by RT PCR NEGATIVE NEGATIVE Final    Comment: (NOTE) SARS-CoV-2 target nucleic acids are NOT DETECTED.  The SARS-CoV-2 RNA is generally detectable in upper respiratory specimens during the acute phase of infection. The lowest concentration of SARS-CoV-2 viral copies this assay can detect is 138 copies/mL. A negative result does not preclude SARS-Cov-2 infection and should not be used as the sole basis for treatment or other patient management decisions. A negative result may occur with  improper specimen collection/handling, submission of specimen other than nasopharyngeal swab, presence of viral mutation(s) within the areas targeted by this assay, and inadequate number of viral copies(<138 copies/mL). A negative result must be combined with clinical observations, patient history, and epidemiological information. The expected result is Negative.  Fact Sheet for Patients:  BloggerCourse.com  Fact Sheet for Healthcare Providers:  SeriousBroker.it  This test is no t yet approved or cleared by the [place] FDA  and  has been authorized for detection and/or diagnosis of SARS-CoV-2 by FDA under an Emergency Use Authorization (EUA). This EUA will remain  in effect (meaning this test can be used) for the duration of the COVID-19 declaration under Section 564(b)(1) of the Act, 21 U.S.C.section 360bbb-3(b)(1), unless the authorization is terminated  or revoked sooner.       Influenza A by PCR NEGATIVE NEGATIVE Final   Influenza B by PCR NEGATIVE NEGATIVE Final    Comment: (NOTE) The Xpert Xpress SARS-CoV-2/FLU/RSV plus assay is intended as an aid in the diagnosis of influenza from Nasopharyngeal swab specimens and should not be used as a sole basis for treatment. Nasal washings and aspirates are unacceptable for Xpert Xpress SARS-CoV-2/FLU/RSV testing.  Fact Sheet for Patients: BloggerCourse.com  Fact Sheet for Healthcare Providers: SeriousBroker.it  This test is not yet approved or cleared by the [place] FDA and has been authorized for detection and/or diagnosis of SARS-CoV-2 by FDA under an Emergency Use Authorization (EUA). This EUA will remain in effect (meaning this test can be used) for the duration of the COVID-19 declaration under Section 564(b)(1) of the Act, 21 U.S.C. section 360bbb-3(b)(1), unless the authorization is terminated or revoked.  Performed at [facility], [address], [place], [place] [address]       Studies: No results found.    Joycelyn Das, MD  [facility] 09/27/2020  If 7PM-7AM, please contact night-coverage

## 2020-09-27 NOTE — Progress Notes (Signed)
PT Cancellation Note  Patient Details Name: Lacey Gonzalez MRN: [id number] DOB: 12/12/1955   Cancelled Treatment:    Reason Eval/Treat Not Completed: Other (comment) Pt received asleep in bed, doesn't open eyes & only minimally verbally responds to PT. Pt repeatedly states "not today" despite PT offering gait, standing & bed level exercises. Pt endorses fatigue & not feeling well, noting she's been vomiting today. Pt continues to decline. Will f/u as able.  Aleda Grana, PT, DPT 09/27/20, 1:33 PM    Sandi Mariscal 09/27/2020, 1:32 PM

## 2020-09-28 DIAGNOSIS — N390 Urinary tract infection, site not specified: Secondary | ICD-10-CM | POA: Diagnosis not present

## 2020-09-28 DIAGNOSIS — I10 Essential (primary) hypertension: Secondary | ICD-10-CM | POA: Diagnosis not present

## 2020-09-28 DIAGNOSIS — E1169 Type 2 diabetes mellitus with other specified complication: Secondary | ICD-10-CM | POA: Diagnosis not present

## 2020-09-28 DIAGNOSIS — E440 Moderate protein-calorie malnutrition: Secondary | ICD-10-CM | POA: Diagnosis not present

## 2020-09-28 LAB — GLUCOSE, CAPILLARY
Glucose-Capillary: 177 mg/dL — ABNORMAL HIGH (ref 70–99)
Glucose-Capillary: 329 mg/dL — ABNORMAL HIGH (ref 70–99)
Glucose-Capillary: 167 mg/dL — ABNORMAL HIGH (ref 70–99)
Glucose-Capillary: 198 mg/dL — ABNORMAL HIGH (ref 70–99)

## 2020-09-28 NOTE — Progress Notes (Signed)
Occupational Therapy Treatment Patient Details Name: Lacey Gonzalez MRN: [id number] DOB: Feb 15, 1955 Today's Date: 09/28/2020   History of present illness Lacey Gonzalez is a 64 y.o. female admitted with reports of intermittent dizziness, weakness R>L side, R facial droop. 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 No hemorrhage or mass effect, Short segment occlusion of the right ACA A2 segment, Mild stenosis of the midportion of the left P2 segment, Motion degraded MRA of the neck without visible occlusion or high-grade stenosis.   OT comments  Upon entering the room, pt very upset over some banking business and needing therapeutic use of self to calm down for intentional and functional activities. Pt requesting assistance for toileting. Min A to stand from EOB and mod A for stand pivot transfer hand held to [facility]. Pt performed her own hygiene and clothing management with min A. Pt ambulating with mod HHA into bathroom and onto 3 in 1 placed in walk in shower. Pt remained seated for bathing tasks with focus on use of R UE involving hand over hand assistance and multiple drops during session. Pt drying self before exiting the bathroom and sitting on EOB to apply lotion again with focus on use of R UE. Pt donning hospital gown and requesting to remain seated on EOB. Bed alarm activated and all needs within reach. Pt continues to benefit from OT intervention and would benefit from intensive inpatient rehab program.   Recommendations for follow up therapy are one component of a multi-disciplinary discharge planning process, led by the attending physician.  Recommendations may be updated based on patient status, additional functional criteria and insurance  authorization.    Follow Up Recommendations  CIR    Equipment Recommendations  Other (comment) (defer to next venue of care)       Precautions / Restrictions Precautions Precautions: Fall Precaution Comments: R hemi Restrictions Weight Bearing Restrictions: No       Mobility Bed Mobility Overal bed mobility: Needs Assistance Bed Mobility: Supine to Sit;Sit to Supine     Supine to sit: Min assist Sit to supine: Min assist   General bed mobility comments: MIN G for cues, pt able to bridge and reposition without physical assistance    Transfers Overall transfer level: Needs assistance Equipment used: 1 person hand held assist Transfers: Sit to/from Stand Sit to Stand: Min assist Stand pivot transfers: Mod assist       General transfer comment: MIN A for stability from lowered surface height & pt relying on bed for posterior LE support    Balance Overall balance assessment: Needs assistance Sitting-balance support: No upper extremity supported;Feet supported Sitting balance-Leahy Scale: Good Sitting balance - Comments: Good static sitting balance at edge of bed Postural control: Right lateral lean Standing balance support: Single extremity supported;During functional activity Standing balance-Leahy Scale: Poor Standing balance comment: LOB to R  w/ posterior lean during amb                           ADL either performed or assessed with clinical judgement   ADL Overall ADL's : Needs assistance/impaired     Grooming: Wash/dry hands;Wash/dry face;Sitting;Cueing for safety;Supervision/safety;Set up   Upper Body Bathing: Minimal assistance;Sitting   Lower Body Bathing: Minimal assistance;Sitting/lateral leans   Upper  Body Dressing : Set up;Supervision/safety;Sitting   Lower Body Dressing: Minimal assistance;Sitting/lateral leans   Toilet Transfer: Moderate assistance;Ambulation   Toileting- Clothing Manipulation and Hygiene: Minimal  assistance;Sit to/from stand       Functional mobility during ADLs: Minimal assistance;Moderate assistance        Cognition Arousal/Alertness: Awake/alert Behavior During Therapy: WFL for tasks assessed/performed;Anxious Overall Cognitive Status: Difficult to assess                                 General Comments: requires redirection of task due to inattention        Exercises General Exercises - Lower Extremity Long Arc Quad: AROM;Strengthening;Both;10 reps;Seated Hip ABduction/ADduction: AROM;Strengthening;Both;10 reps;Seated Hip Flexion/Marching: AROM;Strengthening;10 reps;Seated Other Exercises Other Exercises: Pt edu: functional progression, participation           Pertinent Vitals/ Pain       Pain Assessment: No/denies pain         Frequency  Min 3X/week        Progress Toward Goals  OT Goals(current goals can now be found in the care plan section)  Progress towards OT goals: Progressing toward goals  Acute Rehab OT Goals Patient Stated Goal: to get better OT Goal Formulation: With patient Time For Goal Achievement: 10/05/20 Potential to Achieve Goals: Good  Plan Discharge plan remains appropriate;Frequency remains appropriate       AM-PAC OT "6 Clicks" Daily Activity     Outcome Measure   Help from another person eating meals?: A Little Help from another person taking care of personal grooming?: A Little Help from another person toileting, which includes using toliet, bedpan, or urinal?: A Little Help from another person bathing (including washing, rinsing, drying)?: A Little Help from another person to put on and taking off regular upper body clothing?: A Little Help from another person to put on and taking off regular lower body clothing?: A Little 6 Click Score: 18    End of Session Equipment Utilized During Treatment: Gait belt  OT Visit Diagnosis: Unsteadiness on feet (R26.81);Muscle weakness (generalized)  (M62.81);History of falling (Z91.81);Other abnormalities of gait and mobility (R26.89);Hemiplegia and hemiparesis Hemiplegia - Right/Left: Right Hemiplegia - dominant/non-dominant: Dominant   Activity Tolerance Patient tolerated treatment well   Patient Left in bed;with bed alarm set;with call bell/phone within reach   Nurse Communication Mobility status        Time: 1450-1531 OT Time Calculation (min): 41 min  Charges: OT General Charges $OT Visit: 1 Visit OT Treatments $Self Care/Home Management : 38-52 mins

## 2020-09-28 NOTE — Progress Notes (Signed)
Physical Therapy Treatment Patient Details Name: Lacey Gonzalez MRN: [id number] DOB: 27-Oct-1955 Today's Date: 09/28/2020   History of Present Illness Lacey Gonzalez is a 65 y.o. female admitted with reports of intermittent dizziness, weakness R>L side, R facial droop. 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 No hemorrhage or mass effect, Short segment occlusion of the right ACA A2 segment, Mild stenosis of the midportion of the left P2 segment, Motion degraded MRA of the neck without visible occlusion or high-grade stenosis.    PT Comments    Pt alert, sitting EOB w/ nursing, cooperative throughout treatment session. Pt oriented to name, DOB, & situation. Progressed amb distance w/ 2+ hand held assist for RUE support and safety w/ MOD A. LOB to R x 1 requiring PT assist during amb. Pt requires redirection of task during mobility to maximize safety and function. Overall primary limitations are decreased balance, strength, coordination, and endurance necessary for functional mobility. Skilled PT intervention is indicated to address deficits in function, mobility, and to return to PLOF as able.  CIR remains primary discharge recommendation.   Recommendations for follow up therapy are one component of a multi-disciplinary discharge planning process, led by the attending physician.  Recommendations may be updated based on patient status, additional functional criteria and insurance authorization.  Follow Up Recommendations  CIR     Equipment Recommendations  Other (comment) (TBD next venue of care)    Recommendations for Other Services       Precautions / Restrictions Precautions Precautions: Fall Precaution Comments: R  hemi Restrictions Weight Bearing Restrictions: No     Mobility  Bed Mobility Overal bed mobility: Needs Assistance Bed Mobility: Sit to Supine       Sit to supine: Min guard   General bed mobility comments: MIN G for cues, pt able to bridge and reposition without physical assistance    Transfers Overall transfer level: Needs assistance Equipment used: 1 person hand held assist Transfers: Sit to/from Stand Sit to Stand: Min assist         General transfer comment: MIN A for stability from lowered surface height & pt relying on bed for posterior LE support  Ambulation/Gait Ambulation/Gait assistance: Mod assist;+2 safety/equipment Gait Distance (Feet): 50 Feet Assistive device: 2 person hand held assist Gait Pattern/deviations: Step-to pattern Gait velocity: decreased   General Gait Details: LOB x 1 requiring physical assist for correction; pt requires redirection of task with simple commands and is able to amb w/ step-to pattern   Stairs             Wheelchair Mobility    Modified Rankin (Stroke Patients Only)       Balance Overall balance assessment: Needs assistance Sitting-balance support: No upper extremity supported;Feet supported Sitting balance-Leahy Scale: Good   Postural control: Posterior lean Standing balance support: Single extremity supported;During functional activity Standing balance-Leahy Scale: Poor Standing balance comment: LOB to R  w/ posterior lean during amb                            Cognition Arousal/Alertness: Awake/alert Behavior During Therapy: Impulsive;WFL for tasks assessed/performed Overall Cognitive Status: Difficult to assess  General Comments: requires redirection of task due to inattention      Exercises General Exercises - Lower Extremity Long Arc Quad: AROM;Strengthening;Both;10 reps;Seated Hip ABduction/ADduction: AROM;Strengthening;Both;10  reps;Seated Hip Flexion/Marching: AROM;Strengthening;10 reps;Seated Other Exercises Other Exercises: Pt edu: functional progression, participation    General Comments        Pertinent Vitals/Pain Pain Assessment: No/denies pain    Home Living                      Prior Function            PT Goals (current goals can now be found in the care plan section) Progress towards PT goals: Progressing toward goals    Frequency    7X/week      PT Plan Current plan remains appropriate    Co-evaluation              AM-PAC PT "6 Clicks" Mobility   Outcome Measure  Help needed turning from your back to your side while in a flat bed without using bedrails?: None Help needed moving from lying on your back to sitting on the side of a flat bed without using bedrails?: A Little Help needed moving to and from a bed to a chair (including a wheelchair)?: A Little Help needed standing up from a chair using your arms (e.g., wheelchair or bedside chair)?: A Little Help needed to walk in hospital room?: A Lot Help needed climbing 3-5 steps with a railing? : Total 6 Click Score: 16    End of Session Equipment Utilized During Treatment: Gait belt Activity Tolerance: Patient tolerated treatment well Patient left: in bed;with call bell/phone within reach;with bed alarm set Nurse Communication: Mobility status PT Visit Diagnosis: Unsteadiness on feet (R26.81);Other abnormalities of gait and mobility (R26.89);Muscle weakness (generalized) (M62.81);Difficulty in walking, not elsewhere classified (R26.2);Dizziness and giddiness (R42)     Time: 1610-9604 PT Time Calculation (min) (ACUTE ONLY): 32 min  Charges:                        [employer], SPT

## 2020-09-28 NOTE — Progress Notes (Signed)
PROGRESS NOTE  Lacey Gonzalez KXF:[id number] DOB: 1955/04/26 DOA: 09/20/2020 PCP: [facility]   LOS: 8 days   Brief narrative:  65 year old female with a history of diabetes, hypertension, admitted to the hospital with dizziness, generalized weakness.  Patient was reported that she had right-sided facial droop, and weakness in her right arm and leg.  She was admitted for further work-up for CVA.  MRI brain confirmed acute infarct involving left thalamus and left cerebral peduncle.  Neurology saw the patient during hospitalization.  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  Continue aspirin, Lipitor.  2D echocardiogram showed preserved LV function.  PT OT recommended CIR.  Currently awaiting for placement.  Off telemetry    E. coli urinary tract infection Completed 3-day course of antibiotic.   Diabetes mellitus type II with hyperglycemia. -Patient is on Lantus, Januvia as outpatient.  We will continue Lantus, sliding scale insulin while in the hospital.  Diabetic coordinator on board. On  long-acting insulin to 15 units daily and add mealtime insulin to 4 units 3 times daily.  Latest POC glucose of 177   Diabetic gastroparesis Of Reglan at this time stable   Diabetic peripheral neuropathy. On Cymbalta, and low-dose gabapentin initiated 09/24/2020  Essential hypertension Continue Avapro, amlodipine.  DVT prophylaxis: enoxaparin (LOVENOX) injection 40 mg Start: 09/20/20 2015 SCD's Start: 09/20/20 1957   Code Status: Full code  Family Communication: None  Status is: Inpatient  Remains inpatient appropriate because:Waiting for rehabilitation.  Dispo: The  patient is from: Home              Anticipated d/c is to: CIR, transition of care on board.              Patient currently is medically stable to d/c.   Difficult to place patient No  Consultants: Neurology  Procedures: None  Anti-infectives:  None.  Anti-infectives (From admission, onward)    Start     Dose/Rate Route Frequency Ordered Stop   09/23/20 1000  cephALEXin (KEFLEX) capsule 500 mg        500 mg Oral Every 12 hours 09/22/20 1302 09/23/20 2051   09/21/20 0900  cefTRIAXone (ROCEPHIN) 1 g in sodium chloride 0.9 % 100 mL IVPB  Status:  Discontinued        1 g 200 mL/hr over 30 Minutes Intravenous Every 24 hours 09/20/20 2005 09/22/20 1302   09/20/20 1615  cefTRIAXone (ROCEPHIN) 1 g in sodium chloride 0.9 % 100 mL IVPB  Status:  Discontinued        1 g 200 mL/hr over 30 Minutes Intravenous  Once 09/20/20 1600 09/22/20 1302      Subjective: Today, patient was seen and examined at bedside.  Complains of mild nausea yesterday but no nausea today.  Has not had a bowel movement.  Objective: Vitals:   09/28/20 0433 09/28/20 0720  BP: 123/74 129/62  Pulse: 92 87  Resp: 16 15  Temp: 98 F (36.7 C) 97.9 F (36.6 C)  SpO2: 97% 97%    Intake/Output Summary (Last 24 hours) at 09/28/2020 1034 Last data filed at 09/27/2020 1850 Gross per 24 hour  Intake 240 ml  Output --  Net 240 ml  Filed Weights   09/20/20 1206 09/21/20 0101  Weight: 67.1 kg 67.4 kg   Body mass index is 27.18 kg/m.   Physical Exam: General:  Average built, not in obvious distress, alert awake and oriented HENT:   No scleral pallor or icterus noted. Oral mucosa is moist.  Chest:  Clear breath sounds.  Diminished breath sounds bilaterally. No crackles or wheezes.  CVS: S1 &S2 heard. No murmur.  Regular rate and rhythm. Abdomen: Soft, nontender, nondistended.  Bowel sounds are heard.   Extremities: No cyanosis, clubbing or edema.  Peripheral pulses are palpable. Psych: Alert, awake and  oriented, normal mood CNS: Residual left-sided facial weakness and right-sided extremity weakness noted. Skin: Warm and dry.  No rashes noted.  Data Review: I have personally reviewed the following laboratory data and studies,  CBC: Recent Labs  Lab 09/22/20 0425  WBC 6.4  HGB 14.1  HCT 40.6  MCV 85.8  PLT 265    Basic Metabolic Panel: Recent Labs  Lab 09/22/20 0425 09/26/20 0418 09/27/20 0513  NA 137 135  --   K 3.6 4.2  --   CL 104 105  --   CO2 27 27  --   GLUCOSE 131* 348*  --   BUN 12 22  --   CREATININE 0.56 0.59 0.68  CALCIUM 9.3 9.4  --     Liver Function Tests: Recent Labs  Lab 09/22/20 0425  AST 20  ALT 17  ALKPHOS 101  BILITOT 0.6  PROT 6.2*  ALBUMIN 3.4*    No results for input(s): LIPASE, AMYLASE in the last 168 hours. No results for input(s): AMMONIA in the last 168 hours. Cardiac Enzymes: No results for input(s): CKTOTAL, CKMB, CKMBINDEX, TROPONINI in the last 168 hours.  BNP (last 3 results) No results for input(s): BNP in the last 8760 hours.  ProBNP (last 3 results) No results for input(s): PROBNP in the last 8760 hours.  CBG: Recent Labs  Lab 09/27/20 0842 09/27/20 1127 09/27/20 1641 09/27/20 2124 09/28/20 0718  GLUCAP 286* 333* 148* 218* 177*    Recent Results (from the past 240 hour(s))  Urine Culture     Status: Abnormal   Collection Time: 09/20/20 12:10 PM   Specimen: Urine, Clean Catch  Result Value Ref Range Status   Specimen Description   Final    URINE, CLEAN CATCH Performed at [facility], [address], [place], [place] [address]    Special Requests   Final    NONE Performed at [facility], [address] [place], [place] [address]    Culture >=100,000 COLONIES/mL ESCHERICHIA COLI (A)  Final   Report Status 09/22/2020 FINAL  Final   Organism ID, Bacteria ESCHERICHIA COLI (A)  Final      Susceptibility   Escherichia coli - MIC*    AMPICILLIN <=2 SENSITIVE Sensitive     CEFAZOLIN  <=4 SENSITIVE Sensitive     CEFEPIME <=0.12 SENSITIVE Sensitive     CEFTRIAXONE <=0.25 SENSITIVE Sensitive     CIPROFLOXACIN <=0.25 SENSITIVE Sensitive     GENTAMICIN <=1 SENSITIVE Sensitive     IMIPENEM <=0.25 SENSITIVE Sensitive     NITROFURANTOIN <=16 SENSITIVE Sensitive     TRIMETH/SULFA <=20 SENSITIVE Sensitive     AMPICILLIN/SULBACTAM <=2 SENSITIVE Sensitive     PIP/TAZO <=4 SENSITIVE Sensitive     * >=100,000 COLONIES/mL ESCHERICHIA COLI  Resp Panel by RT-PCR (Flu A&B, Covid) Nasopharyngeal Swab     Status: None   Collection Time: 09/20/20  10:02 PM   Specimen: Nasopharyngeal Swab; Nasopharyngeal(NP) swabs in vial transport medium  Result Value Ref Range Status   SARS Coronavirus 2 by RT PCR NEGATIVE NEGATIVE Final    Comment: (NOTE) SARS-CoV-2 target nucleic acids are NOT DETECTED.  The SARS-CoV-2 RNA is generally detectable in upper respiratory specimens during the acute phase of infection. The lowest concentration of SARS-CoV-2 viral copies this assay can detect is 138 copies/mL. A negative result does not preclude SARS-Cov-2 infection and should not be used as the sole basis for treatment or other patient management decisions. A negative result may occur with  improper specimen collection/handling, submission of specimen other than nasopharyngeal swab, presence of viral mutation(s) within the areas targeted by this assay, and inadequate number of viral copies(<138 copies/mL). A negative result must be combined with clinical observations, patient history, and epidemiological information. The expected result is Negative.  Fact Sheet for Patients:  BloggerCourse.com  Fact Sheet for Healthcare Providers:  SeriousBroker.it  This test is no t yet approved or cleared by the [place] FDA and  has been authorized for detection and/or diagnosis of SARS-CoV-2 by FDA under an Emergency Use Authorization (EUA). This EUA will  remain  in effect (meaning this test can be used) for the duration of the COVID-19 declaration under Section 564(b)(1) of the Act, 21 U.S.C.section 360bbb-3(b)(1), unless the authorization is terminated  or revoked sooner.       Influenza A by PCR NEGATIVE NEGATIVE Final   Influenza B by PCR NEGATIVE NEGATIVE Final    Comment: (NOTE) The Xpert Xpress SARS-CoV-2/FLU/RSV plus assay is intended as an aid in the diagnosis of influenza from Nasopharyngeal swab specimens and should not be used as a sole basis for treatment. Nasal washings and aspirates are unacceptable for Xpert Xpress SARS-CoV-2/FLU/RSV testing.  Fact Sheet for Patients: BloggerCourse.com  Fact Sheet for Healthcare Providers: SeriousBroker.it  This test is not yet approved or cleared by the [place] FDA and has been authorized for detection and/or diagnosis of SARS-CoV-2 by FDA under an Emergency Use Authorization (EUA). This EUA will remain in effect (meaning this test can be used) for the duration of the COVID-19 declaration under Section 564(b)(1) of the Act, 21 U.S.C. section 360bbb-3(b)(1), unless the authorization is terminated or revoked.  Performed at [facility], [address] [place], [place] [address]       Studies: No results found.    Joycelyn Das, MD  [facility] 09/28/2020  If 7PM-7AM, please contact night-coverage

## 2020-09-28 NOTE — Progress Notes (Signed)
Inpatient Diabetes Program Recommendations  AACE/ADA: New Consensus Statement on Inpatient Glycemic Control (2015)  Target Ranges:  Prepandial:   less than 140 mg/dL      Peak postprandial:   less than 180 mg/dL (1-2 hours)      Critically ill patients:  140 - 180 mg/dL   Results for JANNET, CALIP (MRN [id number]) as of 09/28/2020 13:46  Ref. Range 09/28/2020 07:18 09/28/2020 11:02  Glucose-Capillary Latest Ref Range: 70 - 99 mg/dL 286 (H)  7 units Novolog  329 (H)  15 units Novolog  15 units Semglee   Home DM Meds: Lantus 40 units QHS     Januvia 100 mg daily   Current Orders: Semglee 15 units Daily Novolog 0-15 units TID ac/hs  Novolog 4 units TID with meals    MD- Note CBG 329 at 11am today after getting 7 units Novolog this AM (4 units meal coverage + 3 units SSI).  Please consider Increasing the Novolog Meal Coverage to 6 units TID     --Will follow patient during hospitalization--  Ambrose Finland RN, MSN, CDE Diabetes Coordinator Inpatient Glycemic Control Team Team Pager: [phone] (8a-5p)

## 2020-09-29 DIAGNOSIS — E1169 Type 2 diabetes mellitus with other specified complication: Secondary | ICD-10-CM | POA: Diagnosis not present

## 2020-09-29 DIAGNOSIS — I10 Essential (primary) hypertension: Secondary | ICD-10-CM | POA: Diagnosis not present

## 2020-09-29 DIAGNOSIS — E440 Moderate protein-calorie malnutrition: Secondary | ICD-10-CM | POA: Diagnosis not present

## 2020-09-29 LAB — GLUCOSE, CAPILLARY
Glucose-Capillary: 154 mg/dL — ABNORMAL HIGH (ref 70–99)
Glucose-Capillary: 243 mg/dL — ABNORMAL HIGH (ref 70–99)
Glucose-Capillary: 278 mg/dL — ABNORMAL HIGH (ref 70–99)
Glucose-Capillary: 212 mg/dL — ABNORMAL HIGH (ref 70–99)
Glucose-Capillary: 233 mg/dL — ABNORMAL HIGH (ref 70–99)

## 2020-09-29 MED ORDER — INSULIN ASPART 100 UNIT/ML IJ SOLN
6.0000 [IU] | Freq: Three times a day (TID) | INTRAMUSCULAR | Status: DC
  Administered 2020-09-29 – 2020-09-30 (×6): 6 [IU] via SUBCUTANEOUS
  Filled 2020-09-29 (×5): qty 1

## 2020-09-29 MED ORDER — METOCLOPRAMIDE HCL 5 MG PO TABS
10.0000 mg | ORAL_TABLET | Freq: Three times a day (TID) | ORAL | Status: DC | PRN
  Administered 2020-09-30: 10 mg via ORAL
  Filled 2020-09-29 (×2): qty 2

## 2020-09-29 NOTE — Progress Notes (Addendum)
Physical Therapy Treatment Patient Details Name: Lacey Gonzalez MRN: [id number] DOB: Jan 13, 1955 Today's Date: 09/29/2020   History of Present Illness Lacey Gonzalez is a 65 y.o. female admitted with reports of intermittent dizziness, weakness R>L side, R facial droop. 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 No hemorrhage or mass effect, Short segment occlusion of the right ACA A2 segment, Mild stenosis of the midportion of the left P2 segment, Motion degraded MRA of the neck without visible occlusion or high-grade stenosis.    PT Comments    Pt alert, oriented x 4 sitting EOB and agreeable to treatment. Today's treatment session focused on weight shifting to RLE to improve balance, strength, and overall mobility. Pt performed x 5 STS + 1 HHA MIN A w/ LUE, RUE to R knee biasing R weight shift. Pt progressed to MIN G x 1 STS w/ HHA. Pt becomes easily frustrated with performance, continual encouragement is required to redirect focus. Amb in room + 1 HHA w/ MIN A to support RUE and trunk, no LOB noted this session. Pt is slightly impulsive w/ gait and reaches to furniture/wall for support; however, when pt is redirected to slow down, static and dynamic balance are much improved without the need for LUE support. Skilled PT intervention is indicated to address deficits in function, mobility, and to return to PLOF as able.  Discharge recommendations remain CIR.   Recommendations for follow up therapy are one component of a multi-disciplinary discharge planning process, led by the attending physician.  Recommendations may be updated based on patient status, additional functional criteria and insurance authorization.  Follow Up Recommendations  CIR      Equipment Recommendations  Other (comment) (TBD next venue of care)    Recommendations for Other Services       Precautions / Restrictions Precautions Precautions: Fall Precaution Comments: R hemi Restrictions Weight Bearing Restrictions: No     Mobility  Bed Mobility               General bed mobility comments: Pt seated at EOB beginning of session    Transfers Overall transfer level: Needs assistance Equipment used: 1 person hand held assist Transfers: Sit to/from Stand Sit to Stand: Min assist;Min guard         General transfer comment: 5 x STS MIN-A w/ L hand supporting R hand on R knee to bias weight shift; x 1 STS w/ MIN-G for cues  Ambulation/Gait Ambulation/Gait assistance: Min assist Gait Distance (Feet): 30 Feet Assistive device: 1 person hand held assist Gait Pattern/deviations: Step-to pattern;Drifts right/left Gait velocity: decreased   General Gait Details: Pt reaches for wall/furniture LUE, cues for correction without LOB w/ support to RUE; requires focus for stability and redirection of tasks with simple cues for improved stability   Stairs             Wheelchair Mobility    Modified Rankin (Stroke Patients Only)       Balance Overall balance assessment: Needs assistance Sitting-balance support: No upper extremity supported;Feet supported Sitting balance-Leahy Scale: Good     Standing balance support: Single extremity supported;During functional activity Standing balance-Leahy Scale: Poor Standing balance comment: MIN A for static and dynamic tasks  Cognition Arousal/Alertness: Awake/alert Behavior During Therapy: WFL for tasks assessed/performed;Impulsive Overall Cognitive Status: Difficult to assess                                 General Comments: AOx4, follows one step commands with redirection of task, slight impulsivity w/ movement      Exercises Other  Exercises Other Exercises: Bed <> BSC w/ MIN A, no LOB & pt able to perform pericare w/ MIN A    General Comments        Pertinent Vitals/Pain Pain Assessment: No/denies pain Pain Intervention(s): Monitored during session    Home Living                      Prior Function            PT Goals (current goals can now be found in the care plan section) Progress towards PT goals: Progressing toward goals    Frequency    7X/week      PT Plan Current plan remains appropriate    Co-evaluation              AM-PAC PT "6 Clicks" Mobility   Outcome Measure  Help needed turning from your back to your side while in a flat bed without using bedrails?: None Help needed moving from lying on your back to sitting on the side of a flat bed without using bedrails?: A Little Help needed moving to and from a bed to a chair (including a wheelchair)?: A Little Help needed standing up from a chair using your arms (e.g., wheelchair or bedside chair)?: A Little Help needed to walk in hospital room?: A Lot Help needed climbing 3-5 steps with a railing? : Total 6 Click Score: 16    End of Session Equipment Utilized During Treatment: Gait belt Activity Tolerance: Patient tolerated treatment well Patient left: in chair;with call bell/phone within reach;with chair alarm set   PT Visit Diagnosis: Unsteadiness on feet (R26.81);Other abnormalities of gait and mobility (R26.89);Muscle weakness (generalized) (M62.81);Difficulty in walking, not elsewhere classified (R26.2);Dizziness and giddiness (R42)     Time: 3295-1884 PT Time Calculation (min) (ACUTE ONLY): 26 min  Charges:  $Gait Training: 8-22 mins $Therapeutic Activity: 8-22 mins                     [employer], SPT

## 2020-09-29 NOTE — TOC Progression Note (Addendum)
Transition of Care [facility]) - Progression Note    Patient Details  Name: RASHELLE IRELAND MRN: [id number] Date of Birth: 15-Jan-1955  [facility]) CM/SW Contact  Margarito Liner, LCSW Phone Number: 09/29/2020, 9:29 AM  Clinical Narrative:  Left voicemail for [facility] intake to check status of referral.  12:35 pm: No call back from [facility] yet. Patient asking that we sign her up for Medicare today because her birthday is coming up and she is unable to do it. Patient sitting on the edge of the bed reading a book and has her smart phone on. Gave information on signing up on the Medicare website vs calling Hilda Lias at [employer] for assistance with the application. Patient grabbed her room phone and said she was calling her now.  2:14 pm: Left another voicemail at [facility].  3:44 pm: Received voicemail from Amy at [facility]. Their physicians are concerned about patient's lack of caregiver support once discharged home. They spoke with Mr. Val Eagle who confirmed he cannot provide 24/7 supervision because of work. Spoke to patient who confirmed this. She said he can check on her during his lunch breaks. 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 Patient agreeable to [facility]. Told patient we would try to find someone to accept referral. Left voicemail for [facility] asking them to call back and confirm they definitely won't be able to offer.  Expected Discharge Plan: IP Rehab Facility Barriers to Discharge: Continued Medical Work up  Expected Discharge Plan and Services Expected Discharge Plan: IP Rehab Facility   Discharge Planning Services: CM Consult Post Acute Care Choice: IP Rehab Living arrangements for the past 2 months: Single Family Home                 DME Arranged: N/A DME Agency: NA       HH Arranged: NA HH Agency: NA         Social Determinants of Health  (SDOH) Interventions    Readmission Risk Interventions No flowsheet data found.

## 2020-09-29 NOTE — Progress Notes (Signed)
PROGRESS NOTE  Lacey Gonzalez YWV:[id number] DOB: [birth year] DOA: 09/20/2020 PCP: [facility]   LOS: 9 days   Brief narrative:  65 year old female with a history of diabetes, hypertension, admitted to the hospital with dizziness, generalized weakness.  Patient was reported that she had right-sided facial droop, and weakness in her right arm and leg.  She was admitted for further work-up for CVA.  MRI brain confirmed acute infarct involving left thalamus and left cerebral peduncle.  Neurology saw the patient during hospitalization.  At this time, patient has clinically remained stable.  Awaiting for CIR placement.  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  Continue aspirin, Lipitor.  2D to be echocardiogram showed preserved LV function.  PT OT recommended CIR.  Currently awaiting for placement at [facility] facilities.  Patient would benefit from formal  outpatient cognitive linguistic assessment on discharge   E. coli urinary tract infection Resolved.  Completed 3-day course of antibiotic.   Diabetes mellitus type II with hyperglycemia. -Patient is on Lantus, Januvia as outpatient.  Diabetic coordinator on board.. On  long-acting insulin to 15 units daily and add mealtime insulin to 6 units 3 times daily.  Continue sliding scale insulin.  Latest POC glucose of 154.   Diabetic gastroparesis Improved.  Initially required Reglan.  Diabetic peripheral neuropathy. Improved on Cymbalta, and low-dose gabapentin initiated on 09/24/2020  Essential hypertension Continue Avapro, amlodipine.  Still has been stable.  DVT prophylaxis: enoxaparin (LOVENOX) injection 40 mg Start:  09/20/20 2015 SCD's Start: 09/20/20 1957   Code Status: Full code  Family Communication:  I tried to reach the patient contact on the phone but was unable to reach him.   Status is: Inpatient  Remains inpatient appropriate because:Waiting for rehabilitation.  Dispo: The patient is from: Home              Anticipated d/c is to: CIR, transition of care on board.              Patient currently is medically stable to d/c.   Difficult to place patient No  Consultants: Neurology  Procedures: None  Anti-infectives:  None.  Subjective:  Today, patient was seen and examined at bedside.  Denies any nausea,vomiting or abdominal pain today.    Objective: Vitals:   09/29/20 1001 09/29/20 1001  BP: 124/71 124/71  Pulse:  91  Resp:    Temp:    SpO2:     No intake or output data in the 24 hours ending 09/29/20 1022  Filed Weights   09/20/20 1206 09/21/20 0101  Weight: 67.1 kg 67.4 kg   Body mass index is 27.18 kg/m.   Physical Exam:  General:  Average built, not in obvious distress HENT:   No scleral pallor or icterus noted. Oral mucosa is moist.  Chest:  Clear breath sounds.  Diminished breath sounds bilaterally. No crackles or wheezes.  CVS: S1 &S2 heard. No murmur.  Regular rate and rhythm. Abdomen: Soft, nontender, nondistended.  Bowel sounds are heard.   Extremities: No cyanosis, clubbing or edema.  Peripheral pulses are palpable. Psych: Alert, awake and oriented, normal mood CNS: Right-sided facial weakness and extremity weakness noted. Skin: Warm and dry.  No rashes noted.  Data Review: I have personally reviewed the following laboratory data and studies,  CBC: No results for input(s): WBC, NEUTROABS, HGB, HCT, MCV, PLT in the last 168 hours.  Basic Metabolic Panel: Recent Labs  Lab 09/26/20 0418 09/27/20 0513  NA 135  --   K 4.2  --   CL 105  --   CO2 27  --   GLUCOSE 348*  --   BUN 22  --   CREATININE 0.59 0.68  CALCIUM 9.4  --     Liver  Function Tests: No results for input(s): AST, ALT, ALKPHOS, BILITOT, PROT, ALBUMIN in the last 168 hours.  No results for input(s): LIPASE, AMYLASE in the last 168 hours. No results for input(s): AMMONIA in the last 168 hours. Cardiac Enzymes: No results for input(s): CKTOTAL, CKMB, CKMBINDEX, TROPONINI in the last 168 hours.  BNP (last 3 results) No results for input(s): BNP in the last 8760 hours.  ProBNP (last 3 results) No results for input(s): PROBNP in the last 8760 hours.  CBG: Recent Labs  Lab 09/28/20 0718 09/28/20 1102 09/28/20 1601 09/28/20 2056 09/29/20 0723  GLUCAP 177* 329* 167* 198* 154*    Recent Results (from the past 240 hour(s))  Urine Culture     Status: Abnormal   Collection Time: 09/20/20 12:10 PM   Specimen: Urine, Clean Catch  Result Value Ref Range Status   Specimen Description   Final    URINE, CLEAN CATCH Performed at [facility], [address] [place], [place] [address]    Special Requests   Final    NONE Performed at [facility], [address] [place], [place] [address]    Culture >=100,000 COLONIES/mL ESCHERICHIA COLI (A)  Final   Report Status 09/22/2020 FINAL  Final   Organism ID, Bacteria ESCHERICHIA COLI (A)  Final      Susceptibility   Escherichia coli - MIC*    AMPICILLIN <=2 SENSITIVE Sensitive     CEFAZOLIN <=4 SENSITIVE Sensitive     CEFEPIME <=0.12 SENSITIVE Sensitive     CEFTRIAXONE <=0.25 SENSITIVE Sensitive     CIPROFLOXACIN <=0.25 SENSITIVE Sensitive     GENTAMICIN <=1 SENSITIVE Sensitive     IMIPENEM <=0.25 SENSITIVE Sensitive     NITROFURANTOIN <=16 SENSITIVE Sensitive     TRIMETH/SULFA <=20 SENSITIVE Sensitive     AMPICILLIN/SULBACTAM <=2 SENSITIVE Sensitive     PIP/TAZO <=4 SENSITIVE Sensitive     * >=100,000 COLONIES/mL ESCHERICHIA COLI  Resp Panel by RT-PCR (Flu A&B, Covid) Nasopharyngeal Swab     Status: None   Collection Time: 09/20/20 10:02 PM   Specimen: Nasopharyngeal Swab;  Nasopharyngeal(NP) swabs in vial transport medium  Result Value Ref Range Status   SARS Coronavirus 2 by RT PCR NEGATIVE NEGATIVE Final    Comment: (NOTE) SARS-CoV-2 target nucleic acids are NOT DETECTED.  The SARS-CoV-2 RNA is generally detectable in upper respiratory specimens during the acute phase of infection. The lowest concentration of SARS-CoV-2 viral copies this assay can detect is 138 copies/mL. A negative result does not preclude SARS-Cov-2 infection and should not be used as the sole basis for treatment or other patient management decisions. A negative result may occur with  improper specimen collection/handling, submission of specimen other than nasopharyngeal swab, presence of viral mutation(s) within the areas targeted by this assay, and inadequate number of viral copies(<138 copies/mL). A negative result must be combined with clinical observations, patient history, and epidemiological information. The expected result is Negative.  Fact Sheet for  Patients:  BloggerCourse.com  Fact Sheet for Healthcare Providers:  SeriousBroker.it  This test is no t yet approved or cleared by the [place] FDA and  has been authorized for detection and/or diagnosis of SARS-CoV-2 by FDA under an Emergency Use Authorization (EUA). This EUA will remain  in effect (meaning this test can be used) for the duration of the COVID-19 declaration under Section 564(b)(1) of the Act, 21 U.S.C.section 360bbb-3(b)(1), unless the authorization is terminated  or revoked sooner.       Influenza A by PCR NEGATIVE NEGATIVE Final   Influenza B by PCR NEGATIVE NEGATIVE Final    Comment: (NOTE) The Xpert Xpress SARS-CoV-2/FLU/RSV plus assay is intended as an aid in the diagnosis of influenza from Nasopharyngeal swab specimens and should not be used as a sole basis for treatment. Nasal washings and aspirates are unacceptable for Xpert Xpress  SARS-CoV-2/FLU/RSV testing.  Fact Sheet for Patients: BloggerCourse.com  Fact Sheet for Healthcare Providers: SeriousBroker.it  This test is not yet approved or cleared by the [place] FDA and has been authorized for detection and/or diagnosis of SARS-CoV-2 by FDA under an Emergency Use Authorization (EUA). This EUA will remain in effect (meaning this test can be used) for the duration of the COVID-19 declaration under Section 564(b)(1) of the Act, 21 U.S.C. section 360bbb-3(b)(1), unless the authorization is terminated or revoked.  Performed at [facility], [address] [place], [place] [address]       Studies: No results found.   Joycelyn Das, MD  [facility] 09/29/2020  If 7PM-7AM, please contact night-coverage

## 2020-09-29 NOTE — Progress Notes (Signed)
Occupational Therapy Treatment Patient Details Name: Lacey Gonzalez MRN: [id number] DOB: 1955-05-19 Today's Date: 09/29/2020   History of present illness Lacey Gonzalez is a 65 y.o. female admitted with reports of intermittent dizziness, weakness R>L side, R facial droop. 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 No hemorrhage or mass effect, Short segment occlusion of the right ACA A2 segment, Mild stenosis of the midportion of the left P2 segment, Motion degraded MRA of the neck without visible occlusion or high-grade stenosis.   OT comments  Upon entering the room, pt seated on EOB with no c/o pain. Pt reports frustrations today and making phone calls to medicare. Focus on R UE NMR with pt visually scanning and attending to R side with min cuing during session. Pt with increased difficulty isolating 2nd, 4th, and 5th digits during exercises and becoming increasingly frustrated with tasks. Tasks needing to be changed frequently secondary to this occurrence. Pt engaged in table sides in multiple planes with therapist assisting to decrease compensatory techniques. OT continues to discuss expectations during session and encourage pt. She is given instructions to use theraputty for exercises later today after she has calmed down secondary to low frustration tolerance. All needs within reach and pt continues to benefit from OT intervention. Pt would benefit from intensive inpt rehab program to address functional deficits.    Recommendations for follow up therapy are one component of a multi-disciplinary discharge planning process, led by the attending physician.  Recommendations may be updated based on patient status, additional functional criteria and  insurance authorization.    Follow Up Recommendations  CIR    Equipment Recommendations  Other (comment) (defer to next venue of care)       Precautions / Restrictions Precautions Precautions: Fall Precaution Comments: R hemi       Mobility Bed Mobility Overal bed mobility: Needs Assistance Bed Mobility: Supine to Sit;Sit to Supine     Supine to sit: Min assist Sit to supine: Min assist   General bed mobility comments: Pt seated at EOB beginning of session            Balance Overall balance assessment: Needs assistance Sitting-balance support: No upper extremity supported;Feet supported Sitting balance-Leahy Scale: Good Sitting balance - Comments: Good static sitting balance at edge of bed                                   ADL either performed or assessed with clinical judgement      Cognition Arousal/Alertness: Awake/alert Behavior During Therapy: [facility] for tasks assessed/performed;Impulsive Overall Cognitive Status: Difficult to assess                                                     Pertinent Vitals/ Pain       Pain Assessment: No/denies pain         Frequency  Min 3X/week        Progress Toward Goals  OT Goals(current goals can now be found in the care plan section)  Progress towards OT goals: Progressing toward goals  Acute  Rehab OT Goals Patient Stated Goal: to be less frustrated and get better OT Goal Formulation: With patient Time For Goal Achievement: 10/05/20 Potential to Achieve Goals: Good  Plan Discharge plan remains appropriate;Frequency remains appropriate       AM-PAC OT "6 Clicks" Daily Activity     Outcome Measure   Help from another person eating meals?: A Little Help from another person taking care of personal grooming?: A Little Help from another person toileting, which includes using toliet, bedpan, or urinal?: A Little Help from another person bathing (including washing,  rinsing, drying)?: A Little Help from another person to put on and taking off regular upper body clothing?: A Little Help from another person to put on and taking off regular lower body clothing?: A Little 6 Click Score: 18    End of Session    OT Visit Diagnosis: Unsteadiness on feet (R26.81);Muscle weakness (generalized) (M62.81);History of falling (Z91.81);Other abnormalities of gait and mobility (R26.89);Hemiplegia and hemiparesis Hemiplegia - Right/Left: Right Hemiplegia - dominant/non-dominant: Dominant   Activity Tolerance Patient tolerated treatment well;Other (comment) (low frustration tolerance)   Patient Left in bed;with bed alarm set;with call bell/phone within reach   Nurse Communication Mobility status        Time: 1414-1440 OT Time Calculation (min): 26 min  Charges: OT General Charges $OT Visit: 1 Visit OT Treatments $Neuromuscular Re-education: 23-37 mins  Jackquline Denmark, MS, OTR/L , CBIS ascom [phone]  09/29/20, 3:29 PM

## 2020-09-30 DIAGNOSIS — I10 Essential (primary) hypertension: Secondary | ICD-10-CM | POA: Diagnosis not present

## 2020-09-30 DIAGNOSIS — E1169 Type 2 diabetes mellitus with other specified complication: Secondary | ICD-10-CM | POA: Diagnosis not present

## 2020-09-30 DIAGNOSIS — N390 Urinary tract infection, site not specified: Secondary | ICD-10-CM | POA: Diagnosis not present

## 2020-09-30 LAB — CBC
HCT: 39.9 % (ref 36.0–46.0)
Hemoglobin: 14.1 g/dL (ref 12.0–15.0)
MCH: 31.1 pg (ref 26.0–34.0)
MCHC: 35.3 g/dL (ref 30.0–36.0)
MCV: 87.9 fL (ref 80.0–100.0)
Platelets: 254 10*3/uL (ref 150–400)
RBC: 4.54 MIL/uL (ref 3.87–5.11)
RDW: 11.8 % (ref 11.5–15.5)
WBC: 6.5 10*3/uL (ref 4.0–10.5)
nRBC: 0 % (ref 0.0–0.2)

## 2020-09-30 LAB — BASIC METABOLIC PANEL
Anion gap: 10 (ref 5–15)
BUN: 22 mg/dL (ref 8–23)
CO2: 27 mmol/L (ref 22–32)
Calcium: 9.5 mg/dL (ref 8.9–10.3)
Chloride: 101 mmol/L (ref 98–111)
Creatinine, Ser: 0.6 mg/dL (ref 0.44–1.00)
GFR, Estimated: >60 mL/min (ref >60)
Glucose, Bld: 243 mg/dL — ABNORMAL HIGH (ref 70–99)
Potassium: 3.9 mmol/L (ref 3.5–5.1)
Sodium: 138 mmol/L (ref 135–145)

## 2020-09-30 LAB — GLUCOSE, CAPILLARY
Glucose-Capillary: 222 mg/dL — ABNORMAL HIGH (ref 70–99)
Glucose-Capillary: 297 mg/dL — ABNORMAL HIGH (ref 70–99)
Glucose-Capillary: 292 mg/dL — ABNORMAL HIGH (ref 70–99)
Glucose-Capillary: 192 mg/dL — ABNORMAL HIGH (ref 70–99)

## 2020-09-30 MED ORDER — INSULIN ASPART 100 UNIT/ML IJ SOLN
8.0000 [IU] | Freq: Three times a day (TID) | INTRAMUSCULAR | Status: DC
  Administered 2020-10-01 (×3): 8 [IU] via SUBCUTANEOUS
  Filled 2020-09-30 (×3): qty 1

## 2020-09-30 MED ORDER — INSULIN GLARGINE-YFGN 100 UNIT/ML ~~LOC~~ SOLN
18.0000 [IU] | Freq: Every day | SUBCUTANEOUS | Status: DC
  Administered 2020-10-01: 18 [IU] via SUBCUTANEOUS
  Filled 2020-09-30 (×2): qty 0.18

## 2020-09-30 NOTE — Progress Notes (Signed)
Occupational Therapy Treatment Patient Details Name: Lacey Gonzalez MRN: [id number] DOB: [birth year] Today's Date: 09/30/2020   History of present illness Lacey Gonzalez is a 66 y.o. female admitted with reports of intermittent dizziness, weakness R>L side, R facial droop. 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 No hemorrhage or mass effect, Short segment occlusion of the right ACA A2 segment, Mild stenosis of the midportion of the left P2 segment, Motion degraded MRA of the neck without visible occlusion or high-grade stenosis.   OT comments  Upon entering the room, pt reports feeling unwell today and very tired. Pt does request assistance with toileting. Supine >sit with min A to EOB. Pt standing with min A and ambulating with RW to bathroom with min - mod A for balance and safety awareness. Pt very impulsive with movement and very distracted often needing cuing to attend to task for safety.  Pt able to void on toilet and performs grooming while seated for safety awareness. Pt needing cuing to manage RW safety with ambulating. OT still recommends intensive rehab program. However, if pt is unable to attend inpt program and must go home she will need wheelchair and [facility] for safety and to decrease fall risk.    Recommendations for follow up therapy are one component of a multi-disciplinary discharge planning process, led by the attending physician.  Recommendations may be updated based on patient status, additional functional criteria and insurance authorization.    Follow Up Recommendations  CIR , 24/7 supervision   Equipment Recommendations  3 in 1 bedside commode;Wheelchair (measurements OT)       Precautions / Restrictions  Precautions Precautions: Fall Precaution Comments: R hemi       Mobility Bed Mobility Overal bed mobility: Needs Assistance Bed Mobility: Supine to Sit;Sit to Supine Rolling: Min assist   Supine to sit: Min assist Sit to supine: Min assist   General bed mobility comments: Pt seated at EOB beginning of session    Transfers Overall transfer level: Needs assistance Equipment used: Rolling walker (2 wheeled) Transfers: Sit to/from Stand Sit to Stand: Min assist Stand pivot transfers: Mod assist;Min assist            Balance Overall balance assessment: Needs assistance Sitting-balance support: Feet supported Sitting balance-Leahy Scale: Good Sitting balance - Comments: Good static sitting balance at edge of bed Postural control: Right lateral lean Standing balance support: Bilateral upper extremity supported;During functional activity Standing balance-Leahy Scale: Poor Standing balance comment: min to mod assist for balance                           ADL either performed or assessed with clinical judgement   ADL Overall ADL's : Needs assistance/impaired                         Toilet Transfer: Moderate assistance;Ambulation   Toileting- Clothing Manipulation and Hygiene: Minimal assistance;Sit to/from stand                Cognition Arousal/Alertness: Awake/alert Behavior During Therapy: WFL for tasks assessed/performed Overall Cognitive Status: Impaired/Different from baseline Area of Impairment: Awareness;Attention;Problem solving;Following commands;Safety/judgement                   Current  Attention Level: Sustained   Following Commands: Follows one step commands inconsistently;Follows one step commands with increased time Safety/Judgement: Decreased awareness of safety;Decreased awareness of deficits Awareness: Emergent Problem Solving: Requires verbal cues;Requires tactile cues General Comments: decreased awareness to  safety and impulsive with mobility tasks. Pt needing cuing for redirections as she is internally and externally distracted.                   Pertinent Vitals/ Pain       Pain Assessment: 0-10 Pain Score: 0-No pain   Frequency  Min 3X/week        Progress Toward Goals  OT Goals(current goals can now be found in the care plan section)  Progress towards OT goals: Progressing toward goals  Acute Rehab OT Goals Patient Stated Goal: to improve OT Goal Formulation: With patient Time For Goal Achievement: 10/05/20 Potential to Achieve Goals: Good  Plan Discharge plan remains appropriate;Frequency remains appropriate       AM-PAC OT "6 Clicks" Daily Activity     Outcome Measure   Help from another person eating meals?: A Little Help from another person taking care of personal grooming?: A Little Help from another person toileting, which includes using toliet, bedpan, or urinal?: A Lot Help from another person bathing (including washing, rinsing, drying)?: A Lot Help from another person to put on and taking off regular upper body clothing?: A Little Help from another person to put on and taking off regular lower body clothing?: A Lot 6 Click Score: 15    End of Session Equipment Utilized During Treatment: Gait belt  OT Visit Diagnosis: Unsteadiness on feet (R26.81);Muscle weakness (generalized) (M62.81);History of falling (Z91.81);Other abnormalities of gait and mobility (R26.89);Hemiplegia and hemiparesis Hemiplegia - Right/Left: Right Hemiplegia - dominant/non-dominant: Dominant   Activity Tolerance Patient tolerated treatment well;Other (comment)   Patient Left in bed;with bed alarm set;with call bell/phone within reach   Nurse Communication Mobility status        Time: 4970-2637 OT Time Calculation (min): 25 min  Charges: OT General Charges $OT Visit: 1 Visit OT Treatments $Self Care/Home Management : 23-37 mins  Jackquline Denmark, MS, OTR/L , CBIS ascom  [phone]  09/30/20, 4:14 PM

## 2020-09-30 NOTE — Progress Notes (Signed)
Inpatient Diabetes Program Recommendations  AACE/ADA: New Consensus Statement on Inpatient Glycemic Control (2015)  Target Ranges:  Prepandial:   less than 140 mg/dL      Peak postprandial:   less than 180 mg/dL (1-2 hours)      Critically ill patients:  140 - 180 mg/dL  Results for Lacey Gonzalez, Lacey Gonzalez (MRN [id number]) as of 09/30/2020 08:06  Ref. Range 09/29/2020 07:23 09/29/2020 12:10 09/29/2020 15:32 09/29/2020 17:03 09/29/2020 21:19  Glucose-Capillary Latest Ref Range: 70 - 99 mg/dL 644 (H)  9 units Novolog  243 (H)  11 units Novolog  15 units Semglee  278 (H) 212 (H)  11 units Novolog  233 (H)  2 units Novolog    Results for Lacey Gonzalez, Lacey Gonzalez (MRN [id number]) as of 09/30/2020 10:06  Ref. Range 09/30/2020 07:28  Glucose-Capillary Latest Ref Range: 70 - 99 mg/dL 638 (H)    Home DM Meds: Lantus 40 units QHS     Januvia 100 mg daily     Current Orders: Semglee 15 units Daily Novolog 0-15 units TID ac/hs  Novolog 6 units TID with meals    MD- Please consider:  1. Increase Semglee slightly to 18 units Daily  2. Increase Novolog Meal Coverage to 8 units TID with meals    --Will follow patient during hospitalization--  Ambrose Finland RN, MSN, CDE Diabetes Coordinator Inpatient Glycemic Control Team Team Pager: [phone] (8a-5p)

## 2020-09-30 NOTE — Progress Notes (Signed)
Physical Therapy Treatment Patient Details Name: Lacey Gonzalez MRN: [id number] DOB: 09/06/1955 Today's Date: 09/30/2020   History of Present Illness Lacey Gonzalez is a 65 y.o. female admitted with reports of intermittent dizziness, weakness R>L side, R facial droop. 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 No hemorrhage or mass effect, Short segment occlusion of the right ACA A2 segment, Mild stenosis of the midportion of the left P2 segment, Motion degraded MRA of the neck without visible occlusion or high-grade stenosis.    PT Comments    Patient received sitting up on side of bed. She is agreeable to PT session. Patient with decreased attention, slightly impulsive. Poor awareness for safety with mobility. She requires min assist for sit to stand and min/mod for ambulation with RW.  Two episodes of R knee buckling during gait. Patient is at high fall risk. She will continue to benefit from skilled PT while here to improve functional independence, and safety.         Recommendations for follow up therapy are one component of a multi-disciplinary discharge planning process, led by the attending physician.  Recommendations may be updated based on patient status, additional functional criteria and insurance authorization.  Follow Up Recommendations  CIR     Equipment Recommendations  Other (comment) (TBD, if home, she will need RW)    Recommendations for Other Services Rehab consult     Precautions / Restrictions Precautions Precautions: Fall Precaution Comments: R hemi Restrictions Weight Bearing Restrictions: No     Mobility  Bed Mobility               General bed mobility comments: Pt seated at EOB beginning of session     Transfers Overall transfer level: Needs assistance Equipment used: 1 person hand held assist Transfers: Sit to/from Stand Sit to Stand: Min assist         General transfer comment: min assist with balance  Ambulation/Gait Ambulation/Gait assistance: Min assist;Mod assist Gait Distance (Feet): 150 Feet Assistive device: Rolling walker (2 wheeled) Gait Pattern/deviations: Step-through pattern;Decreased step length - right;Decreased dorsiflexion - right;Drifts right/left Gait velocity: decreased   General Gait Details: Patient asking about using walker, trial ambulation with RW, she is able to keep R UE on walker. Decreased attention to right side. Veers to right with ambulation. 2 occasions of right knee buckling with ambulation requiring mod assist to regain balance. Cues for slowing pace and focusing on what she is doing. She is at high fall risk   Social research officer, government Rankin (Stroke Patients Only)       Balance Overall balance assessment: Needs assistance Sitting-balance support: Feet supported Sitting balance-Leahy Scale: Good Sitting balance - Comments: Good static sitting balance at edge of bed   Standing balance support: Bilateral upper extremity supported;During functional activity Standing balance-Leahy Scale: Poor Standing balance comment: min to mod assist for balance                            Cognition Arousal/Alertness: Awake/alert Behavior During Therapy: WFL for tasks assessed/performed Overall Cognitive Status: Impaired/Different from baseline Area of Impairment: Awareness;Attention;Problem solving;Following commands;Safety/judgement  Current Attention Level: Sustained   Following Commands: Follows one step commands inconsistently;Follows one step commands with increased time Safety/Judgement: Decreased awareness of safety;Decreased awareness of deficits Awareness:  Emergent Problem Solving: Requires verbal cues;Requires tactile cues General Comments: Patient easily distracted, repeating self during session. She requires cues to stay on task. Decreased awarenss of safety.      Exercises Other Exercises Other Exercises: LE strengthening: seated LAQ, marching x 10 with cues to focus on task and continue.    General Comments        Pertinent Vitals/Pain Pain Assessment: No/denies pain    Home Living                      Prior Function            PT Goals (current goals can now be found in the care plan section) Acute Rehab PT Goals Patient Stated Goal: to improve PT Goal Formulation: With patient Time For Goal Achievement: 10/06/20 Potential to Achieve Goals: Good Progress towards PT goals: Progressing toward goals    Frequency    7X/week      PT Plan Current plan remains appropriate    Co-evaluation              AM-PAC PT "6 Clicks" Mobility   Outcome Measure  Help needed turning from your back to your side while in a flat bed without using bedrails?: None Help needed moving from lying on your back to sitting on the side of a flat bed without using bedrails?: A Little Help needed moving to and from a bed to a chair (including a wheelchair)?: A Little Help needed standing up from a chair using your arms (e.g., wheelchair or bedside chair)?: A Little Help needed to walk in hospital room?: A Lot Help needed climbing 3-5 steps with a railing? : A Lot 6 Click Score: 17    End of Session Equipment Utilized During Treatment: Gait belt Activity Tolerance: Patient tolerated treatment well Patient left: in bed;with bed alarm set;with nursing/sitter in room Nurse Communication: Mobility status PT Visit Diagnosis: Unsteadiness on feet (R26.81);Other abnormalities of gait and mobility (R26.89);Difficulty in walking, not elsewhere classified (R26.2);Hemiplegia and hemiparesis Hemiplegia - Right/Left: Right Hemiplegia -  dominant/non-dominant: Dominant Hemiplegia - caused by: Cerebral infarction     Time: 2409-7353 PT Time Calculation (min) (ACUTE ONLY): 13 min  Charges:  $Gait Training: 8-22 mins                     [employer], PT, GCS 09/30/20,10:14 AM

## 2020-09-30 NOTE — TOC Progression Note (Addendum)
Transition of Care [facility]) - Progression Note    Patient Details  Name: Lacey Gonzalez MRN: [id number] Date of Birth: 05-30-1955  [facility] CM/SW Contact  Margarito Liner, LCSW Phone Number: 09/30/2020, 10:58 AM  Clinical Narrative:   Received voicemail from [facility] confirming they will not be able to offer her a bed. Will start working on finding home health.  11:15 am: [facility] is not in network with her insurance. Patient apparently has [facility]. [facility], [facility], and [facility] also unable to accept. Faxed referral to [facility] for review.  3:15 pm: [facility] unable to accept due to staffing issues. [facility] and [facility] unable to accept. [facility] checking to see if they can accept. Left message for [facility] representative. Updated patient. She plans to return home with her significant other at discharge and said even if home health is unable to be arranged, she still wants to go home rather than SNF.  3:35 pm: [facility] would be able to provide PT, OT, RN, and aide. They will confirm they are in network with her BCBS plan and follow up.  Expected Discharge Plan: IP Rehab Facility Barriers to Discharge: Continued Medical Work up  Expected Discharge Plan and Services Expected Discharge Plan: IP Rehab Facility   Discharge Planning Services: CM Consult Post Acute Care Choice: IP Rehab Living arrangements for the past 2 months: Single Family Home                 DME Arranged: N/A DME Agency: NA       HH Arranged: NA HH Agency: NA         Social Determinants of Health (SDOH) Interventions    Readmission Risk Interventions No flowsheet data found.

## 2020-09-30 NOTE — Progress Notes (Signed)
PROGRESS NOTE  Lacey Gonzalez LGX:[id number] DOB: [birth year] DOA: 09/20/2020 PCP: [facility]   LOS: 10 days   Brief narrative:  65 year old female with a history of diabetes, hypertension, admitted to the hospital with dizziness, generalized weakness.  Patient was reported that she had right-sided facial droop, and weakness in her right arm and leg.  She was admitted for further work-up for CVA.  MRI brain confirmed acute infarct involving left thalamus and left cerebral peduncle.  Neurology saw the patient during hospitalization.   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  Continue aspirin, Lipitor.  2D to be echocardiogram showed preserved LV function.  PT OT recommended CIR. unfortunately, unable to pursue CIR due to limited insurance coverage.  TOC working with patient.  Plan will be to likely discharge home with home health.  Once home health can be arranged, she can discharge.   E. coli urinary tract infection Resolved.  Completed 3-day course of antibiotic.   Diabetes mellitus type II with hyperglycemia. -Patient is on Lantus, Januvia as outpatient.  Diabetic coordinator on board.  Basal insulin increased to 18 units daily and mealtime NovoLog increased to 8 units 3 times daily.  Continue sliding scale insulin.     Diabetic gastroparesis Improved.  Initially required Reglan.  Diabetic peripheral neuropathy. Improved on Cymbalta, and low-dose gabapentin initiated on 09/24/2020  Essential hypertension Continue Avapro, amlodipine.  Still has been stable.  DVT prophylaxis: enoxaparin (LOVENOX) injection 40 mg Start: 09/20/20 2015 SCD's Start: 09/20/20  1957   Code Status: Full code  Family Communication:  I tried to reach the patient contact on the phone but was unable to reach him.   Status is: Inpatient  Remains inpatient appropriate because:Waiting for rehabilitation.  Dispo: The patient is from: Home              Anticipated d/c is to: Probable discharge home with home health              Patient currently is medically stable to d/c.   Difficult to place patient No  Consultants: Neurology  Procedures: None  Anti-infectives:  None.  Subjective:  She feels as though her right-sided weakness is improving.  Right leg weakness is better.  Continues to have some right hand weakness.  Objective: Vitals:   09/30/20 1157 09/30/20 1659  BP: (!) 141/78 138/82  Pulse: 96 84  Resp: 16 17  Temp: 98.2 F (36.8 C) 98.1 F (36.7 C)  SpO2: 98% 99%    Intake/Output Summary (Last 24 hours) at 09/30/2020 1849 Last data filed at 09/30/2020 1841 Gross per 24 hour  Intake 240 ml  Output --  Net 240 ml   Filed Weights   09/20/20 1206 09/21/20 0101  Weight: 67.1 kg 67.4 kg   Body mass index is 27.18 kg/m.   Physical Exam:  General exam: Alert, awake, oriented x 3 Respiratory system: Clear to auscultation. Respiratory effort normal. Cardiovascular system:RRR. No murmurs, rubs, gallops. Gastrointestinal system: Abdomen is nondistended, soft and nontender. No organomegaly or masses felt. Normal bowel sounds heard. Central nervous system: Alert and oriented. Continued weakness in right hand Extremities: No C/C/E, +pedal pulses Skin: No rashes, lesions or ulcers Psychiatry: Judgement and insight appear  normal. Mood & affect appropriate.     Data Review: I have personally reviewed the following laboratory data and studies,  CBC: Recent Labs  Lab 09/30/20 0515  WBC 6.5  HGB 14.1  HCT 39.9  MCV 87.9  PLT 254   Basic Metabolic Panel: Recent Labs  Lab 09/26/20 0418 09/27/20 0513 09/30/20 0515  NA 135  --  138   K 4.2  --  3.9  CL 105  --  101  CO2 27  --  27  GLUCOSE 348*  --  243*  BUN 22  --  22  CREATININE 0.59 0.68 0.60  CALCIUM 9.4  --  9.5   Liver Function Tests: No results for input(s): AST, ALT, ALKPHOS, BILITOT, PROT, ALBUMIN in the last 168 hours.  No results for input(s): LIPASE, AMYLASE in the last 168 hours. No results for input(s): AMMONIA in the last 168 hours. Cardiac Enzymes: No results for input(s): CKTOTAL, CKMB, CKMBINDEX, TROPONINI in the last 168 hours.  BNP (last 3 results) No results for input(s): BNP in the last 8760 hours.  ProBNP (last 3 results) No results for input(s): PROBNP in the last 8760 hours.  CBG: Recent Labs  Lab 09/29/20 1703 09/29/20 2119 09/30/20 0728 09/30/20 1159 09/30/20 1742  GLUCAP 212* 233* 222* 297* 292*   Recent Results (from the past 240 hour(s))  Resp Panel by RT-PCR (Flu A&B, Covid) Nasopharyngeal Swab     Status: None   Collection Time: 09/20/20 10:02 PM   Specimen: Nasopharyngeal Swab; Nasopharyngeal(NP) swabs in vial transport medium  Result Value Ref Range Status   SARS Coronavirus 2 by RT PCR NEGATIVE NEGATIVE Final    Comment: (NOTE) SARS-CoV-2 target nucleic acids are NOT DETECTED.  The SARS-CoV-2 RNA is generally detectable in upper respiratory specimens during the acute phase of infection. The lowest concentration of SARS-CoV-2 viral copies this assay can detect is 138 copies/mL. A negative result does not preclude SARS-Cov-2 infection and should not be used as the sole basis for treatment or other patient management decisions. A negative result may occur with  improper specimen collection/handling, submission of specimen other than nasopharyngeal swab, presence of viral mutation(s) within the areas targeted by this assay, and inadequate number of viral copies(<138 copies/mL). A negative result must be combined with clinical observations, patient history, and epidemiological information. The expected result is  Negative.  Fact Sheet for Patients:  BloggerCourse.com  Fact Sheet for Healthcare Providers:  SeriousBroker.it  This test is no t yet approved or cleared by the [place] FDA and  has been authorized for detection and/or diagnosis of SARS-CoV-2 by FDA under an Emergency Use Authorization (EUA). This EUA will remain  in effect (meaning this test can be used) for the duration of the COVID-19 declaration under Section 564(b)(1) of the Act, 21 U.S.C.section 360bbb-3(b)(1), unless the authorization is terminated  or revoked sooner.       Influenza A by PCR NEGATIVE NEGATIVE Final   Influenza B by PCR NEGATIVE NEGATIVE Final    Comment: (NOTE) The Xpert Xpress SARS-CoV-2/FLU/RSV plus assay is intended as an aid in the diagnosis of influenza from Nasopharyngeal swab specimens and should not be used as a sole basis for treatment. Nasal washings and aspirates are unacceptable for Xpert Xpress SARS-CoV-2/FLU/RSV testing.  Fact Sheet for Patients: BloggerCourse.com  Fact Sheet for Healthcare Providers: SeriousBroker.it  This test is not yet approved or cleared by the [place] FDA and has been authorized for detection and/or diagnosis of SARS-CoV-2 by  FDA under an Emergency Use Authorization (EUA). This EUA will remain in effect (meaning this test can be used) for the duration of the COVID-19 declaration under Section 564(b)(1) of the Act, 21 U.S.C. section 360bbb-3(b)(1), unless the authorization is terminated or revoked.  Performed at [facility], [address] [place], [place] [address]       Studies: No results found.   Erick Blinks, MD  [facility] 09/30/2020  If 7PM-7AM, please contact night-coverage

## 2020-10-01 LAB — GLUCOSE, CAPILLARY
Glucose-Capillary: 355 mg/dL — ABNORMAL HIGH (ref 70–99)
Glucose-Capillary: 295 mg/dL — ABNORMAL HIGH (ref 70–99)
Glucose-Capillary: 232 mg/dL — ABNORMAL HIGH (ref 70–99)

## 2020-10-01 MED ORDER — ASPIRIN 81 MG PO TBEC: 81.0000 mg | DELAYED_RELEASE_TABLET | Freq: Every day | ORAL | 11 refills | Status: AC

## 2020-10-01 MED ORDER — GLUCERNA SHAKE PO LIQD
237.0000 mL | Freq: Three times a day (TID) | ORAL | Status: DC
  Administered 2020-10-01: 237 mL via ORAL

## 2020-10-01 MED ORDER — POLYETHYLENE GLYCOL 3350 17 G PO PACK: 17.0000 g | PACK | Freq: Every day | ORAL | 0 refills | Status: DC

## 2020-10-01 MED ORDER — ATORVASTATIN CALCIUM 40 MG PO TABS: 40.0000 mg | ORAL_TABLET | Freq: Every day | ORAL | 1 refills | Status: AC

## 2020-10-01 MED ORDER — AMLODIPINE BESYLATE 10 MG PO TABS: 10.0000 mg | ORAL_TABLET | Freq: Every day | ORAL | 1 refills | Status: AC

## 2020-10-01 NOTE — Progress Notes (Signed)
Physical Therapy Treatment Patient Details Name: Lacey Gonzalez MRN: [id number] DOB: July 11, 1955 Today's Date: 10/01/2020   History of Present Illness Lacey Gonzalez is a 65 y.o. female admitted with reports of intermittent dizziness, weakness R>L side, R facial droop. 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 No hemorrhage or mass effect, Short segment occlusion of the right ACA A2 segment, Mild stenosis of the midportion of the left P2 segment, Motion degraded MRA of the neck without visible occlusion or high-grade stenosis.    PT Comments    Patient received sitting edge of bed. Agrees to PT session. Patient tends to repeat herself during session. Refusing a wheelchair despite encouragement from PT to get. Patient requires min guard for sit to stand due to poor safety awareness. She ambulated 150 feet with RW. Cues for slow, controlled pace, to focus on task at hand as she is easily distracted out in hallway. Patient has poor insight to limitations and risk of falls upon returning home. She will continue to benefit from skilled PT while here to improve safety, strength and coordination.        Recommendations for follow up therapy are one component of a multi-disciplinary discharge planning process, led by the attending physician.  Recommendations may be updated based on patient status, additional functional criteria and insurance authorization.  Follow Up Recommendations  CIR;Home health PT;Supervision for mobility/OOB     Equipment Recommendations  Rolling walker with 5" wheels;3in1 (PT)    Recommendations for Other Services Rehab consult     Precautions / Restrictions Precautions Precautions: Fall Precaution Comments: R  hemi Restrictions Weight Bearing Restrictions: No     Mobility  Bed Mobility               General bed mobility comments: Pt seated at EOB beginning of session    Transfers Overall transfer level: Needs assistance Equipment used: Rolling walker (2 wheeled);None Transfers: Sit to/from Stand Sit to Stand: Min guard         General transfer comment: patient requires min guard for safety with transfers. Tends to flop down when going from stand to sit. Cues given to control her descent.  Ambulation/Gait Ambulation/Gait assistance: Min assist Gait Distance (Feet): 150 Feet Assistive device: Rolling walker (2 wheeled) Gait Pattern/deviations: Step-through pattern;Decreased step length - right;Decreased dorsiflexion - right;Drifts right/left Gait velocity: decreased   General Gait Details: Patient ambulated with RW. Able to keep her right UE up on walker well. She will get distracted and then has difficulty advancing R LE or shuffles more on floor. Improved ability to focus and give attention to task at hand. She continues to be a high fall risk with mobility if not supervised.   Stairs             Wheelchair Mobility    Modified Rankin (Stroke Patients Only)       Balance Overall balance assessment: Needs assistance Sitting-balance support: Feet supported Sitting balance-Leahy Scale: Good Sitting balance - Comments: Good static sitting balance at edge of bed   Standing balance support: Bilateral upper extremity supported;During functional activity Standing balance-Leahy Scale: Fair Standing balance comment: min A for balance and safety  Cognition Arousal/Alertness: Awake/alert Behavior During Therapy: WFL for tasks assessed/performed Overall Cognitive Status: Impaired/Different from baseline Area of Impairment: Awareness;Attention;Problem solving;Safety/judgement                   Current Attention Level:  Sustained   Following Commands: Follows one step commands with increased time Safety/Judgement: Decreased awareness of safety;Decreased awareness of deficits Awareness: Emergent Problem Solving: Requires verbal cues;Requires tactile cues General Comments: decreased awareness to safety and impulsive with mobility tasks. Pt needing cuing for redirections as she is internally and externally distracted.      Exercises Other Exercises Other Exercises: Standing heel raises, side stepping x 5 feet x 6 reps at sink with B UE support. Min guard for safety    General Comments        Pertinent Vitals/Pain Pain Assessment: No/denies pain    Home Living                      Prior Function            PT Goals (current goals can now be found in the care plan section) Acute Rehab PT Goals Patient Stated Goal: to improve PT Goal Formulation: With patient Time For Goal Achievement: 10/06/20 Potential to Achieve Goals: Good Progress towards PT goals: Progressing toward goals    Frequency    7X/week      PT Plan Current plan remains appropriate    Co-evaluation              AM-PAC PT "6 Clicks" Mobility   Outcome Measure  Help needed turning from your back to your side while in a flat bed without using bedrails?: None Help needed moving from lying on your back to sitting on the side of a flat bed without using bedrails?: A Little Help needed moving to and from a bed to a chair (including a wheelchair)?: A Little Help needed standing up from a chair using your arms (e.g., wheelchair or bedside chair)?: A Little Help needed to walk in hospital room?: A Little Help needed climbing 3-5 steps with a railing? : A Little 6 Click Score: 19    End of Session Equipment Utilized During Treatment: Gait belt Activity Tolerance: Patient tolerated treatment well Patient left: in bed;with call bell/phone within reach;with bed alarm set Nurse Communication: Mobility status PT  Visit Diagnosis: Unsteadiness on feet (R26.81);Other abnormalities of gait and mobility (R26.89);Difficulty in walking, not elsewhere classified (R26.2);Hemiplegia and hemiparesis Hemiplegia - Right/Left: Right Hemiplegia - dominant/non-dominant: Dominant Hemiplegia - caused by: Cerebral infarction     Time: 3612-2449 PT Time Calculation (min) (ACUTE ONLY): 21 min  Charges:  $Gait Training: 8-22 mins                     [employer], PT, GCS 10/01/20,11:38 AM

## 2020-10-01 NOTE — Discharge Summary (Signed)
Physician Discharge Summary  Lacey TOMPSON WUJ:[id number] DOB: 04-20-1955 DOA: 09/20/2020  PCP: [facility]  Admit date: 09/20/2020 Discharge date: 10/01/2020  Admitted From: Home Disposition: Home  Recommendations for Outpatient Follow-up:  Follow up with PCP in 1-2 weeks Please obtain BMP/CBC in one week   Home Health: Home health PT, OT Equipment/Devices: Walker  Discharge Condition: Stable CODE STATUS: Full code Diet recommendation: Heart healthy, carb modified  Brief/Interim Summary: 65 year old female with a history of diabetes, hypertension, admitted to the hospital with dizziness, generalized weakness.  Patient was reported that she had right-sided facial droop, and weakness in her right arm and leg.  She was admitted for further work-up for CVA.  MRI brain confirmed acute infarct involving left thalamus and left cerebral peduncle.  Neurology saw the patient during hospitalization.  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  Continue aspirin, Lipitor.  2D to be echocardiogram showed preserved LV function.  PT OT recommended CIR. unfortunately, unable to pursue CIR due to limited insurance coverage.  TOC working with patient.  Plan will be to discharge home with home health.     E. coli urinary tract infection Resolved.  Completed 3-day course of antibiotic.   Diabetes mellitus type II with hyperglycemia. -Patient is on Lantus, Januvia as outpatient.  Diabetic coordinator on board.  Basal insulin increased to 18 units daily and mealtime NovoLog increased to 8 units 3 times daily.  Continue sliding scale insulin.     Diabetic gastroparesis Improved.   Initially required Reglan.   Diabetic peripheral neuropathy. Improved on Cymbalta, and low-dose gabapentin initiated on 09/24/2020   Essential hypertension Continue Avapro, amlodipine.  Still has been stable  Discharge Instructions  Discharge Instructions     Diet - low sodium heart healthy   Complete by: As directed    Increase activity slowly   Complete by: As directed       Allergies as of 10/01/2020   No Known Allergies      Medication List     STOP taking these medications    enalapril 10 MG tablet Commonly known as: VASOTEC       TAKE these medications    acetaminophen 500 MG tablet Commonly known as: TYLENOL Take 500 mg by mouth in the morning and at bedtime.   amLODipine 10 MG tablet Commonly known as: NORVASC Take 1 tablet (10 mg total) by mouth daily. Start taking on: October 02, 2020   aspirin 81 MG EC tablet Take 1 tablet (81 mg total) by mouth daily. Swallow whole. Start taking on: October 02, 2020   atorvastatin 40 MG tablet Commonly known as: LIPITOR Take 1 tablet (40 mg total) by mouth daily. Start taking on: October 02, 2020   DULoxetine 30 MG capsule Commonly known as: CYMBALTA Take 30 mg by mouth daily.   insulin glargine 100 UNIT/ML injection Commonly known as: LANTUS Inject 40 Units into the skin at bedtime.   Januvia 100 MG tablet Generic drug: sitaGLIPtin Take 100 mg by mouth daily.   metoCLOPramide 10 MG tablet Commonly known as: REGLAN Take 1 tablet (10 mg total) by mouth every 8 (eight) hours as needed for nausea or vomiting.   Multi-Vitamin tablet Take 1  tablet by mouth daily.   olmesartan 20 MG tablet Commonly known as: BENICAR Take 20 mg by mouth daily.   ondansetron 4 MG tablet Commonly known as: Zofran Take 1 tablet (4 mg total) by mouth daily as needed for nausea or vomiting.   polyethylene glycol 17 g packet Commonly known as: MIRALAX / GLYCOLAX Take 17 g by mouth daily. Start taking on:  October 02, 2020               Durable Medical Equipment  (From admission, onward)           Start     Ordered   10/01/20 1113  For home use only DME Walker rolling  Once       Question Answer Comment  Walker: With 5 Inch Wheels   Patient needs a walker to treat with the following condition Weakness      10/01/20 1112   10/01/20 0948  For home use only DME 3 n 1  Once        10/01/20 0947   10/01/20 0945  For home use only DME lightweight manual wheelchair with seat cushion  Once       Comments: Patient suffers from acute CVA with right sided weakness which impairs their ability to perform daily activities like bathing, walking, toileting, preparing meals in the home.  A walker will not resolve  issue with performing activities of daily living. A wheelchair will allow patient to safely perform daily activities. Patient is not able to propel themselves in the home using a standard weight wheelchair due to general weakness. Patient can self propel in the lightweight wheelchair. Length of need 12 months . Accessories: elevating leg rests (ELRs), wheel locks, extensions and anti-tippers, back cushion.   10/01/20 0946            No Known Allergies  Consultations: Neurology   Procedures/Studies: CT HEAD WO CONTRAST ( )  Result Date: 09/20/2020 CLINICAL DATA:  Neuro deficit, acute stroke suspected. EXAM: CT HEAD WITHOUT CONTRAST TECHNIQUE: Contiguous axial images were obtained from the base of the skull through the vertex without intravenous contrast. COMPARISON:  CT head 08/27/2016 BRAIN: BRAIN Cerebral ventricle sizes are concordant with the degree of cerebral volume loss. Patchy and confluent areas of decreased attenuation are noted throughout the deep and periventricular white matter of the cerebral hemispheres bilaterally, compatible with chronic microvascular ischemic disease. No evidence of large-territorial acute infarction. No parenchymal hemorrhage. No mass  lesion. No extra-axial collection. No mass effect or midline shift. No hydrocephalus. Basilar cisterns are patent. Vascular: No hyperdense vessel. Skull: No acute fracture or focal lesion. Sinuses/Orbits: Paranasal sinuses and mastoid air cells are clear. The orbits are unremarkable. Other: None. IMPRESSION: No acute intracranial abnormality. Electronically Signed   By: Tish Frederickson M.D.   On: 09/20/2020 17:51   MR ANGIO HEAD WO CONTRAST  Result Date: 09/20/2020 CLINICAL DATA:  Dizziness EXAM: MRI HEAD WITHOUT CONTRAST MRA HEAD WITHOUT CONTRAST MRA NECK WITHOUT CONTRAST TECHNIQUE: Multiplanar, multiecho pulse sequences of the brain and surrounding structures were obtained without intravenous contrast. Angiographic images of the Circle of Willis were obtained using MRA technique without intravenous contrast. Angiographic images of the neck were obtained using MRA technique without intravenous contrast. Carotid stenosis measurements (when applicable) are obtained utilizing NASCET criteria, using the distal internal carotid diameter as the denominator. COMPARISON:  None. FINDINGS: MRI HEAD FINDINGS Brain: There is a small acute infarct of the left thalamus extending into the left cerebral peduncle. No  acute or chronic hemorrhage. There is multifocal hyperintense T2-weighted signal within the white matter. Parenchymal volume and CSF spaces are normal. The midline structures are normal. Vascular: Major flow voids are preserved. Skull and upper cervical spine: Normal calvarium and skull base. Visualized upper cervical spine and soft tissues are normal. Sinuses/Orbits:No paranasal sinus fluid levels or advanced mucosal thickening. No mastoid or middle ear effusion. Normal orbits. MRA HEAD FINDINGS POSTERIOR CIRCULATION: --Vertebral arteries: Normal --Inferior cerebellar arteries: Normal. --Basilar artery: Normal. --Superior cerebellar arteries: Normal. --Posterior cerebral arteries: Mild stenosis of the midportion  of the left P2 segment. Normal right. Both P comms are patent. ANTERIOR CIRCULATION: --Intracranial internal carotid arteries: Normal. 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 Normal left. --Middle cerebral arteries (MCA): Normal. ANATOMIC VARIANTS: Both posterior communicating arteries are patent. MRA NECK FINDINGS Study quality is degraded by motion and lack of intravenous contrast agent. Within that limitation, the V2 and V3 segments of both vertebral arteries are patent. There is no occlusion of the distal common carotid arteries or the proximal internal carotid arteries. IMPRESSION: 1. Small acute infarct of the left thalamus extending into the left cerebral peduncle. No hemorrhage or mass effect. 2. 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 3. Mild stenosis of the midportion of the left P2 segment. 4. Motion degraded MRA of the neck without visible occlusion or high-grade stenosis. Electronically Signed   By: Deatra Robinson M.D.   On: 09/20/2020 19:45   MR ANGIO NECK WO CONTRAST  Result Date: 09/20/2020 CLINICAL DATA:  Dizziness EXAM: MRI HEAD WITHOUT CONTRAST MRA HEAD WITHOUT CONTRAST MRA NECK WITHOUT CONTRAST TECHNIQUE: Multiplanar, multiecho pulse sequences of the brain and surrounding structures were obtained without intravenous contrast. Angiographic images of the Circle of Willis were obtained using MRA technique without intravenous contrast. Angiographic images of the neck were obtained using MRA technique without intravenous contrast. Carotid stenosis measurements (when applicable) are obtained utilizing NASCET criteria, using the distal internal carotid diameter as the denominator. COMPARISON:  None. FINDINGS: MRI HEAD FINDINGS Brain: There is a small acute infarct of the left thalamus extending into the left cerebral peduncle. No acute or chronic hemorrhage. There is multifocal hyperintense T2-weighted signal within the white matter.  Parenchymal volume and CSF spaces are normal. The midline structures are normal. Vascular: Major flow voids are preserved. Skull and upper cervical spine: Normal calvarium and skull base. Visualized upper cervical spine and soft tissues are normal. Sinuses/Orbits:No paranasal sinus fluid levels or advanced mucosal thickening. No mastoid or middle ear effusion. Normal orbits. MRA HEAD FINDINGS POSTERIOR CIRCULATION: --Vertebral arteries: Normal --Inferior cerebellar arteries: Normal. --Basilar artery: Normal. --Superior cerebellar arteries: Normal. --Posterior cerebral arteries: Mild stenosis of the midportion of the left P2 segment. Normal right. Both P comms are patent. ANTERIOR CIRCULATION: --Intracranial internal carotid arteries: Normal. 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 Normal left. --Middle cerebral arteries (MCA): Normal. ANATOMIC VARIANTS: Both posterior communicating arteries are patent. MRA NECK FINDINGS Study quality is degraded by motion and lack of intravenous contrast agent. Within that limitation, the V2 and V3 segments of both vertebral arteries are patent. There is no occlusion of the distal common carotid arteries or the proximal internal carotid arteries. IMPRESSION: 1. Small acute infarct of the left thalamus extending into the left cerebral peduncle. No hemorrhage or mass effect. 2. 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 3. Mild stenosis of the midportion of the left P2  segment. 4. Motion degraded MRA of the neck without visible occlusion or high-grade stenosis. Electronically Signed   By: Deatra Robinson M.D.   On: 09/20/2020 19:45   MR BRAIN WO CONTRAST  Result Date: 09/20/2020 CLINICAL DATA:  Dizziness EXAM: MRI HEAD WITHOUT CONTRAST MRA HEAD WITHOUT CONTRAST MRA NECK WITHOUT CONTRAST TECHNIQUE: Multiplanar, multiecho pulse sequences of the brain and surrounding structures were obtained without intravenous contrast.  Angiographic images of the Circle of Willis were obtained using MRA technique without intravenous contrast. Angiographic images of the neck were obtained using MRA technique without intravenous contrast. Carotid stenosis measurements (when applicable) are obtained utilizing NASCET criteria, using the distal internal carotid diameter as the denominator. COMPARISON:  None. FINDINGS: MRI HEAD FINDINGS Brain: There is a small acute infarct of the left thalamus extending into the left cerebral peduncle. No acute or chronic hemorrhage. There is multifocal hyperintense T2-weighted signal within the white matter. Parenchymal volume and CSF spaces are normal. The midline structures are normal. Vascular: Major flow voids are preserved. Skull and upper cervical spine: Normal calvarium and skull base. Visualized upper cervical spine and soft tissues are normal. Sinuses/Orbits:No paranasal sinus fluid levels or advanced mucosal thickening. No mastoid or middle ear effusion. Normal orbits. MRA HEAD FINDINGS POSTERIOR CIRCULATION: --Vertebral arteries: Normal --Inferior cerebellar arteries: Normal. --Basilar artery: Normal. --Superior cerebellar arteries: Normal. --Posterior cerebral arteries: Mild stenosis of the midportion of the left P2 segment. Normal right. Both P comms are patent. ANTERIOR CIRCULATION: --Intracranial internal carotid arteries: Normal. 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 Normal left. --Middle cerebral arteries (MCA): Normal. ANATOMIC VARIANTS: Both posterior communicating arteries are patent. MRA NECK FINDINGS Study quality is degraded by motion and lack of intravenous contrast agent. Within that limitation, the V2 and V3 segments of both vertebral arteries are patent. There is no occlusion of the distal common carotid arteries or the proximal internal carotid arteries. IMPRESSION: 1. Small acute infarct of the left thalamus extending into the left cerebral  peduncle. No hemorrhage or mass effect. 2. 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 3. Mild stenosis of the midportion of the left P2 segment. 4. Motion degraded MRA of the neck without visible occlusion or high-grade stenosis. Electronically Signed   By: Deatra Robinson M.D.   On: 09/20/2020 19:45   US Carotid Bilateral (at [facility] and [facility] only)  Result Date: 09/20/2020 CLINICAL DATA:  Stroke. Dizziness. Bilateral leg and white arm weakness. Syncope and diabetes. EXAM: BILATERAL CAROTID DUPLEX ULTRASOUND TECHNIQUE: Wallace Cullens scale imaging, color Doppler and duplex ultrasound were performed of bilateral carotid and vertebral arteries in the neck. COMPARISON:  None. FINDINGS: Criteria: Quantification of carotid stenosis is based on velocity parameters that correlate the residual internal carotid diameter with NASCET-based stenosis levels, using the diameter of the distal internal carotid lumen as the denominator for stenosis measurement. The following velocity measurements were obtained: RIGHT ICA: 86/28 cm/sec CCA: 68/14 cm/sec SYSTOLIC ICA/CCA RATIO:  1.3 ECA: 77 cm/sec LEFT ICA: 120/30 cm/sec CCA: 89/12 cm/sec SYSTOLIC ICA/CCA RATIO:  1.4 ECA: 58 cm/sec RIGHT CAROTID ARTERY: Mild intimal thickening without significant calcific plaque formation. Homogeneous flow demonstrated in the carotid bifurcation on color flow Doppler imaging. Normal flow velocity waveforms. RIGHT VERTEBRAL ARTERY:  Patent with antegrade flow direction. LEFT CAROTID ARTERY: Mild noncalcific plaque formation. Homogeneous flow demonstrated in the carotid bifurcation on color flow Doppler imaging. Normal flow velocity waveforms. LEFT VERTEBRAL ARTERY:  Patent with antegrade flow direction. IMPRESSION: No evidence  of hemodynamically significant stenosis of either right or the left internal carotid artery. Noncalcific plaque formation is demonstrated in the distal left common carotid artery without significant flow alteration. Electronically  Signed   By: Burman Nieves M.D.   On: 09/20/2020 22:22   ECHOCARDIOGRAM COMPLETE  Result Date: 09/22/2020    ECHOCARDIOGRAM REPORT   Patient Name:   JOLIN BENAVIDES Date of Exam: 09/22/2020 Medical Rec #:  [id number]     Height:       62.0 in Accession #:    [id number]    Weight:       148.0 lb Date of Birth:  1955-08-19    BSA:          1.682 m Patient Age:    64 years      BP:           164/77 mmHg Patient Gender: F             HR:           86 bpm. Exam Location:  [facility] Procedure: 2D Echo, Color Doppler, Cardiac Doppler and Saline Contrast Bubble            Study Indications:     I63.9 Stroke  History:         Patient has no prior history of Echocardiogram examinations.                  Risk Factors:Diabetes.  Sonographer:     Humphrey Rolls Referring Phys:  [id number] Rayne Du Diagnosing Phys: Alwyn Pea MD IMPRESSIONS  1. Left ventricular ejection fraction, by estimation, is 55 to 60%. The left ventricle has normal function. The left ventricle has no regional wall motion abnormalities. There is mild concentric left ventricular hypertrophy. Left ventricular diastolic parameters are consistent with Grade II diastolic dysfunction (pseudonormalization).  2. Right ventricular systolic function is normal. The right ventricular size is normal.  3. The mitral valve is grossly normal. Mild mitral valve regurgitation.  4. The aortic valve is normal in structure. Aortic valve regurgitation is not visualized. FINDINGS  Left Ventricle: Left ventricular ejection fraction, by estimation, is 55 to 60%. The left ventricle has normal function. The left ventricle has no regional wall motion abnormalities. The left ventricular internal cavity size was normal in size. There is  mild concentric left ventricular hypertrophy. Left ventricular diastolic parameters are consistent with Grade II diastolic dysfunction (pseudonormalization). Right Ventricle: The right ventricular size is normal. No increase in right ventricular wall  thickness. Right ventricular systolic function is normal. Left Atrium: Left atrial size was normal in size. Right Atrium: Right atrial size was normal in size. Pericardium: There is no evidence of pericardial effusion. Mitral Valve: The mitral valve is grossly normal. Mild mitral valve regurgitation. Tricuspid Valve: The tricuspid valve is normal in structure. Tricuspid valve regurgitation is mild. Aortic Valve: The aortic valve is normal in structure. Aortic valve regurgitation is not visualized. Pulmonic Valve: The pulmonic valve was grossly normal. Pulmonic valve regurgitation is not visualized. Aorta: The ascending aorta was not well visualized. IAS/Shunts: No atrial level shunt detected by color flow Doppler. Agitated saline contrast was given intravenously to evaluate for intracardiac shunting. Alwyn Pea MD Electronically signed by Alwyn Pea MD Signature Date/Time: 09/22/2020/4:58:04 PM    Final       Subjective: No new complaints, overall she is feeling better  Discharge Exam: Vitals:   10/01/20 0125 10/01/20 0352 10/01/20 0752 10/01/20 1159  BP: 138/67  120/77 139/78 (!) 144/78  Pulse: 86 93 96 (!) 107  Resp: Temp: 97.7 F (36.5 C) 97.6 F (36.4 C) 98.4 F (36.9 C) 98.4 F (36.9 C)  TempSrc: Oral     SpO2: 96% 99% 98% 97%  Weight:      Height:        General: Pt is alert, awake, not in acute distress Cardiovascular: RRR, S1/S2 +, no rubs, no gallops Respiratory: CTA bilaterally, no wheezing, no rhonchi Abdominal: Soft, NT, ND, bowel sounds + Extremities: no edema, no cyanosis    The results of significant diagnostics from this hospitalization (including imaging, microbiology, ancillary and laboratory) are listed below for reference.     Microbiology: No results found for this or any previous visit (from the past 240 hour(s)).   Labs: BNP (last 3 results) No results for input(s): BNP in the last 8760 hours. Basic Metabolic Panel: Recent Labs   Lab 09/26/20 0418 09/27/20 0513 09/30/20 0515  NA 135  --  138  K 4.2  --  3.9  CL 105  --  101  CO2 27  --  27  GLUCOSE 348*  --  243*  BUN 22  --  22  CREATININE 0.59 0.68 0.60  CALCIUM 9.4  --  9.5   Liver Function Tests: No results for input(s): AST, ALT, ALKPHOS, BILITOT, PROT, ALBUMIN in the last 168 hours. No results for input(s): LIPASE, AMYLASE in the last 168 hours. No results for input(s): AMMONIA in the last 168 hours. CBC: Recent Labs  Lab 09/30/20 0515  WBC 6.5  HGB 14.1  HCT 39.9  MCV 87.9  PLT 254   Cardiac Enzymes: No results for input(s): CKTOTAL, CKMB, CKMBINDEX, TROPONINI in the last 168 hours. BNP: Invalid input(s): POCBNP CBG: Recent Labs  Lab 09/30/20 1742 09/30/20 2026 10/01/20 0928 10/01/20 1155 10/01/20 1647  GLUCAP 292* 192* 355* 295* 232*   D-Dimer No results for input(s): DDIMER in the last 72 hours. Hgb A1c No results for input(s): HGBA1C in the last 72 hours. Lipid Profile No results for input(s): CHOL, HDL, LDLCALC, TRIG, CHOLHDL, LDLDIRECT in the last 72 hours. Thyroid function studies No results for input(s): TSH, T4TOTAL, T3FREE, THYROIDAB in the last 72 hours.  Invalid input(s): FREET3 Anemia work up No results for input(s): VITAMINB12, FOLATE, FERRITIN, TIBC, IRON, RETICCTPCT in the last 72 hours. Urinalysis    Component Value Date/Time   COLORURINE YELLOW 09/20/2020 1210   APPEARANCEUR CLOUDY (A) 09/20/2020 1210   LABSPEC 1.015 09/20/2020 1210   PHURINE 5.0 09/20/2020 1210   GLUCOSEU >1,000 (A) 09/20/2020 1210   HGBUR NEGATIVE 09/20/2020 1210   BILIRUBINUR NEGATIVE 09/20/2020 1210   KETONESUR 15 (A) 09/20/2020 1210   PROTEINUR NEGATIVE 09/20/2020 1210   NITRITE POSITIVE (A) 09/20/2020 1210   LEUKOCYTESUR TRACE (A) 09/20/2020 1210   Sepsis Labs Invalid input(s): PROCALCITONIN,  WBC,  LACTICIDVEN Microbiology No results found for this or any previous visit (from the past 240 hour(s)).   Time coordinating  discharge:  SIGNED:   Erick Blinks, MD  [facility] 10/01/2020, 9:01 PM   If 7PM-7AM, please contact night-coverage [email or web address]

## 2020-10-01 NOTE — Progress Notes (Signed)
Occupational Therapy Treatment Patient Details Name: Lacey Gonzalez MRN: [id number] DOB: [birth year] Today's Date: 10/01/2020   History of present illness Lacey Gonzalez is a 65 y.o. female admitted with reports of intermittent dizziness, weakness R>L side, R facial droop. 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 No hemorrhage or mass effect, Short segment occlusion of the right ACA A2 segment, Mild stenosis of the midportion of the left P2 segment, Motion degraded MRA of the neck without visible occlusion or high-grade stenosis.   OT comments  Upon entering the room, pt supine in bed and demanding shower. OT spoke to pt about discharge order being in and OT present to provide continued education for self care. OT explained recommendation further for wheelchair for safety secondary to pt being significant fall risk at home. Pt reports," No, it is a small house". OT educated pt that she should not be ambulating without assistance. Pt very impulsive with mobility this session. She ambulates short distance into bathroom and needing cuing for attention to R LE or she drugs foot along. Pt with 2 LOB's requiring mod A to correct. Pt seated in 3 in 1 commode in shower for bathing with multiple drops from R hand while washing body. Pt drying self while seated and needing min A to don B socks. Pt ambulating to recliner chair in same manner as above. She becomes frustrated with navigating in room and in small spaces and OT reminds pt that she said "it's a small house". Pt seated in recliner chair with focus on hemiplegic dressing with min cuing for technique and min A for UB self care and mod A for LB self care. Pt remained in recliner chair with chair alarm  activated. Pt is high fall risk for home and impulsive. OT continues to recommend 24/7 supervision at discharge.    Recommendations for follow up therapy are one component of a multi-disciplinary discharge planning process, led by the attending physician.  Recommendations may be updated based on patient status, additional functional criteria and insurance authorization.    Follow Up Recommendations  CIR;Supervision/Assistance - 24 hour    Equipment Recommendations  3 in 1 bedside commode;Wheelchair (measurements OT)       Precautions / Restrictions Precautions Precautions: Fall Precaution Comments: R hemi Restrictions Weight Bearing Restrictions: No       Mobility Bed Mobility Overal bed mobility: Needs Assistance Bed Mobility: Supine to Sit     Supine to sit: Min assist     General bed mobility comments: Pt seated at EOB beginning of session    Transfers Overall transfer level: Needs assistance Equipment used: Rolling walker (2 wheeled) Transfers: Sit to/from Stand Sit to Stand: Min guard;Min assist Stand pivot transfers: Mod assist;Min assist       General transfer comment: Multiple LOB during session requiring mod - max A to correct.    Balance Overall balance assessment: Needs assistance Sitting-balance support: Feet supported Sitting balance-Leahy Scale: Good Sitting balance - Comments: Good static sitting balance at edge of bed   Standing balance support: Bilateral upper extremity supported;During functional activity Standing balance-Leahy Scale: Fair Standing balance comment: min A for balance and safety  ADL either performed or assessed with clinical judgement   ADL Overall ADL's : Needs assistance/impaired         Upper Body Bathing: Minimal assistance;Sitting   Lower Body Bathing: Minimal assistance;Sitting/lateral leans   Upper Body Dressing : Minimal assistance;Sitting   Lower Body Dressing: Moderate  assistance;Sit to/from stand Lower Body Dressing Details (indicate cue type and reason): doffing skirt (worn upon admission)                     Vision Patient Visual Report: No change from baseline            Cognition Arousal/Alertness: Awake/alert Behavior During Therapy: Impulsive Overall Cognitive Status: Impaired/Different from baseline Area of Impairment: Awareness;Attention;Problem solving;Safety/judgement                   Current Attention Level: Sustained   Following Commands: Follows one step commands with increased time Safety/Judgement: Decreased awareness of safety;Decreased awareness of deficits Awareness: Emergent Problem Solving: Requires verbal cues;Requires tactile cues General Comments: Very impulsive and demanding throughout session        Exercises Other Exercises Other Exercises: Standing heel raises, side stepping x 5 feet x 6 reps at sink with B UE support. Min guard for safety           Pertinent Vitals/ Pain       Pain Assessment: No/denies pain Pain Score: 0-No pain  Home Living Family/patient expects to be discharged to:: Private residence Living Arrangements: Alone                                          Frequency  Min 3X/week        Progress Toward Goals  OT Goals(current goals can now be found in the care plan section)  Progress towards OT goals: Progressing toward goals  Acute Rehab OT Goals Patient Stated Goal: to improve OT Goal Formulation: With patient Time For Goal Achievement: 10/05/20 Potential to Achieve Goals: Good  Plan Discharge plan remains appropriate;Frequency remains appropriate       AM-PAC OT "6 Clicks" Daily Activity     Outcome Measure   Help from another person eating meals?: A Little Help from another person taking care of personal grooming?: A Little Help from another person toileting, which includes using toliet, bedpan, or urinal?: A Lot Help from another  person bathing (including washing, rinsing, drying)?: A Lot Help from another person to put on and taking off regular upper body clothing?: A Little Help from another person to put on and taking off regular lower body clothing?: Total 6 Click Score: 14    End of Session    OT Visit Diagnosis: Unsteadiness on feet (R26.81);Muscle weakness (generalized) (M62.81);History of falling (Z91.81);Other abnormalities of gait and mobility (R26.89);Hemiplegia and hemiparesis Hemiplegia - Right/Left: Right Hemiplegia - dominant/non-dominant: Dominant   Activity Tolerance Patient tolerated treatment well;Other (comment)   Patient Left with call bell/phone within reach;in chair;with chair alarm set   Nurse Communication Mobility status        Time: 1025-8527 OT Time Calculation (min): 39 min  Charges: OT General Charges $OT Visit: 1 Visit OT Treatments $Self Care/Home Management : 38-52 mins  Jackquline Denmark, MS, OTR/L , CBIS ascom [phone]  10/01/20, 3:20 PM

## 2020-10-01 NOTE — TOC Transition Note (Signed)
Transition of Care [facility]) - CM/SW Discharge Note   Patient Details  Name: Lacey Gonzalez MRN: [id number] Date of Birth: 1955-08-04  Transition of Care [facility]) CM/SW Contact:  Allayne Butcher, RN Phone Number: 10/01/2020, 11:17 AM   Clinical Narrative:    Patient medically cleared for discharge home today with home health services.  [facility] will review referral, they said they would not be able to go out until early next week and that will be acceptable, no other agency has been able to accept.  PT and OT recommended Wheelchair but patient has refused.  She will accept a rolling walker and bedside commode.  DME to be delivered to the room by [employer].  Patient's boyfriend will pick her up today and she will be staying with him at discharge.     Final next level of care: Home w Home Health Services Barriers to Discharge: Barriers Resolved   Patient Goals and CMS Choice Patient states their goals for this hospitalization and ongoing recovery are:: patient glad to be going home- agrees to walker and [facility] CMS Medicare.gov Compare Post Acute Care list provided to:: Patient Choice offered to / list presented to : Patient  Discharge Placement                       Discharge Plan and Services   Discharge Planning Services: CM Consult Post Acute Care Choice: IP Rehab          DME Arranged: Walker rolling, 3-N-1 DME Agency: [employer] Date DME Agency Contacted: 10/01/20 Time DME Agency Contacted: [phone] Representative spoke with at DME Agency: Bjorn Loser HH Arranged: PT, OT HH Agency: Other - See comment ([facility]) Date HH Agency Contacted: 10/01/20 Time HH Agency Contacted: 1030 Representative spoke with at [facility] Agency: Onalee Hua  Social Determinants of Health (SDOH) Interventions     Readmission Risk Interventions No flowsheet data found.

## 2020-10-01 NOTE — Progress Notes (Signed)
Nutrition Follow-up  DOCUMENTATION CODES:  Non-severe (moderate) malnutrition in context of chronic illness  INTERVENTION:  Add Glucerna Shake po TID, each supplement provides 220 kcal and 10 grams of protein.  Continue MVI with minerals daily.  Obtain updated weight.  Encourage PO intake.  NUTRITION DIAGNOSIS:  Moderate Malnutrition related to chronic illness (uncontrolled T2DM) as evidenced by mild fat depletion, mild muscle depletion. - ongoing  GOAL:  Patient will meet greater than or equal to 90% of their needs - meeting consistently  MONITOR:  PO intake, Supplement acceptance, Labs, Weight trends, I & O's  REASON FOR ASSESSMENT:  Malnutrition Screening Tool    ASSESSMENT:  65 yo female with a PMH of diabetes on Januvia and Lantus, gallstone, DDD, hypertension on Vasotec and recently started on Benicar, anxiety/depression Cymbalta who presented with dizziness and generalized weakness, dysuria. Admitted with CVA.  Plan for discharge today with Home Health.  Spoke briefly with pt at bedside. She reports eating well and not vomiting the past two days. She endorses liking the Glucerna and would like it re-ordered. RD to order this again for pt.  Pt continues to eat relatively well - POs mostly 100% with a 50% and 25% documented.   Pt due for new weight. RD to order.  Medications: reviewed; Lasix, SSI, Semglee, MVI with minerals, Senokot BID  Labs: reviewed; CBG 192-297 (H)  Diet Order:   Diet Order             Diet Carb Modified Fluid consistency: Thin; Room service appropriate? Yes with Assist  Diet effective now                  EDUCATION NEEDS:  Education needs have been addressed  Skin:  Skin Assessment: Reviewed RN Assessment  Last BM:  09/30/20 - Type 3, large  Height:  Ht Readings from Last 1 Encounters:  09/21/20 5\' 2"  (1.575 m)   Weight:  Wt Readings from Last 1 Encounters:  09/21/20 67.4 kg   BMI:  Body mass index is 27.18  kg/m.  Estimated Nutritional Needs:  Kcal:  1650-1850 Protein:  85-100 grams Fluid:  > 1.6 L  09/23/20, RD, LDN (she/her/hers) Registered Dietitian I After-Hours/Weekend Pager # in [place]

## 2020-10-01 NOTE — TOC Progression Note (Signed)
Transition of Care [facility]) - Progression Note    Patient Details  Name: Lacey Gonzalez MRN: [id number] Date of Birth: [birth year]  Transition of Care [facility]) CM/SW Contact  Margarito Liner, LCSW Phone Number: 10/01/2020, 8:16 AM  Clinical Narrative:  [facility] unable to accept referral. [facility]'s answer still pending. Left another message.   Expected Discharge Plan: IP Rehab Facility Barriers to Discharge: Continued Medical Work up  Expected Discharge Plan and Services Expected Discharge Plan: IP Rehab Facility   Discharge Planning Services: CM Consult Post Acute Care Choice: IP Rehab Living arrangements for the past 2 months: Single Family Home                 DME Arranged: N/A DME Agency: NA       HH Arranged: NA HH Agency: NA         Social Determinants of Health (SDOH) Interventions    Readmission Risk Interventions No flowsheet data found.

## 2020-10-01 NOTE — Progress Notes (Signed)
    Durable Medical Equipment  (From admission, onward)           Start     Ordered   10/01/20 0945  For home use only DME lightweight manual wheelchair with seat cushion  Once       Comments: Patient suffers from acute CVA with right sided weakness which impairs their ability to perform daily activities like bathing, walking, toileting, preparing meals in the home.  A walker will not resolve  issue with performing activities of daily living. A wheelchair will allow patient to safely perform daily activities. Patient is not able to propel themselves in the home using a standard weight wheelchair due to general weakness. Patient can self propel in the lightweight wheelchair. Length of need 12 months . Accessories: elevating leg rests (ELRs), wheel locks, extensions and anti-tippers, back cushion.   10/01/20 1031

## 2020-10-01 NOTE — Progress Notes (Signed)
Lacey Gonzalez to be D/C'd  per MD order.  Discussed with the patient and all questions fully answered.  VSS, Skin clean, dry and intact without evidence of skin break down, no evidence of skin tears noted.  IV catheter discontinued intact. Site without signs and symptoms of complications. Dressing and pressure applied.  An After Visit Summary was printed and given to the patient. Patient received prescription.  D/c education completed with patient/family including follow up instructions, medication list, d/c activities limitations if indicated, with other d/c instructions as indicated by MD - patient able to verbalize understanding, all questions fully answered.   Patient instructed to return to ED, call 911, or call MD for any changes in condition.   Patient to be escorted via WC, and D/C home via private auto.

## 2021-07-28 ENCOUNTER — Ambulatory Visit: Admit: 2021-07-28 | Payer: BLUE CROSS/BLUE SHIELD | Admitting: Internal Medicine

## 2021-07-28 SURGERY — COLONOSCOPY WITH PROPOFOL
Anesthesia: General

## 2021-08-25 ENCOUNTER — Inpatient Hospital Stay
Admission: EM | Admit: 2021-08-25 | Discharge: 2021-08-27 | DRG: 690 | Disposition: A | Discharge status: Home / Self Care | Payer: Medicare HMO | Attending: Internal Medicine | Admitting: Internal Medicine

## 2021-08-25 ENCOUNTER — Other Ambulatory Visit: Payer: Self-pay

## 2021-08-25 ENCOUNTER — Encounter: Payer: Self-pay | Admitting: Emergency Medicine

## 2021-08-25 ENCOUNTER — Emergency Department: Payer: Medicare HMO

## 2021-08-25 DIAGNOSIS — Z79899 Other long term (current) drug therapy: Secondary | ICD-10-CM

## 2021-08-25 DIAGNOSIS — Z794 Long term (current) use of insulin: Secondary | ICD-10-CM

## 2021-08-25 DIAGNOSIS — I10 Essential (primary) hypertension: Secondary | ICD-10-CM | POA: Diagnosis present

## 2021-08-25 DIAGNOSIS — E785 Hyperlipidemia, unspecified: Secondary | ICD-10-CM | POA: Diagnosis present

## 2021-08-25 DIAGNOSIS — E1110 Type 2 diabetes mellitus with ketoacidosis without coma: Secondary | ICD-10-CM | POA: Diagnosis present

## 2021-08-25 DIAGNOSIS — B9689 Other specified bacterial agents as the cause of diseases classified elsewhere: Secondary | ICD-10-CM | POA: Diagnosis present

## 2021-08-25 DIAGNOSIS — Z7982 Long term (current) use of aspirin: Secondary | ICD-10-CM

## 2021-08-25 DIAGNOSIS — N10 Acute tubulo-interstitial nephritis: Principal | ICD-10-CM | POA: Diagnosis present

## 2021-08-25 DIAGNOSIS — F32A Depression, unspecified: Secondary | ICD-10-CM | POA: Diagnosis present

## 2021-08-25 DIAGNOSIS — I639 Cerebral infarction, unspecified: Secondary | ICD-10-CM | POA: Diagnosis present

## 2021-08-25 DIAGNOSIS — R112 Nausea with vomiting, unspecified: Secondary | ICD-10-CM

## 2021-08-25 DIAGNOSIS — Z8673 Personal history of transient ischemic attack (TIA), and cerebral infarction without residual deficits: Secondary | ICD-10-CM

## 2021-08-25 DIAGNOSIS — E119 Type 2 diabetes mellitus without complications: Secondary | ICD-10-CM

## 2021-08-25 DIAGNOSIS — E8729 Other acidosis: Secondary | ICD-10-CM | POA: Diagnosis present

## 2021-08-25 DIAGNOSIS — K8020 Calculus of gallbladder without cholecystitis without obstruction: Secondary | ICD-10-CM | POA: Diagnosis present

## 2021-08-25 DIAGNOSIS — N12 Tubulo-interstitial nephritis, not specified as acute or chronic: Principal | ICD-10-CM

## 2021-08-25 DIAGNOSIS — F419 Anxiety disorder, unspecified: Secondary | ICD-10-CM | POA: Diagnosis present

## 2021-08-25 HISTORY — DX: Essential (primary) hypertension: I10

## 2021-08-25 HISTORY — DX: Depression, unspecified: F32.A

## 2021-08-25 HISTORY — DX: Nausea with vomiting, unspecified: R11.2

## 2021-08-25 HISTORY — DX: Cerebral infarction, unspecified: I63.9

## 2021-08-25 HISTORY — DX: Anxiety disorder, unspecified: F41.9

## 2021-08-25 LAB — COMPREHENSIVE METABOLIC PANEL
ALT: 18 U/L (ref 0–44)
AST: 16 U/L (ref 15–41)
Albumin: 3.5 g/dL (ref 3.5–5.0)
Alkaline Phosphatase: 295 U/L — ABNORMAL HIGH (ref 38–126)
Anion gap: 16 — ABNORMAL HIGH (ref 5–15)
BUN: 15 mg/dL (ref 8–23)
CO2: 20 mmol/L — ABNORMAL LOW (ref 22–32)
Calcium: 9.8 mg/dL (ref 8.9–10.3)
Chloride: 99 mmol/L (ref 98–111)
Creatinine, Ser: 0.79 mg/dL (ref 0.44–1.00)
GFR, Estimated: >60 mL/min (ref >60)
Glucose, Bld: 329 mg/dL — ABNORMAL HIGH (ref 70–99)
Potassium: 3.7 mmol/L (ref 3.5–5.1)
Sodium: 135 mmol/L (ref 135–145)
Total Bilirubin: 1.5 mg/dL — ABNORMAL HIGH (ref 0.3–1.2)
Total Protein: 8.3 g/dL — ABNORMAL HIGH (ref 6.5–8.1)

## 2021-08-25 LAB — URINALYSIS, ROUTINE W REFLEX MICROSCOPIC
Bacteria, UA: NONE SEEN
Bilirubin Urine: NEGATIVE
Glucose, UA: >=500 mg/dL — AB
Ketones, ur: 80 mg/dL — AB
Nitrite: NEGATIVE
Protein, ur: 100 mg/dL — AB
Specific Gravity, Urine: 1.017 (ref 1.005–1.030)
WBC, UA: >50 WBC/hpf — ABNORMAL HIGH (ref 0–5)
pH: 5 (ref 5.0–8.0)

## 2021-08-25 LAB — CBC
HCT: 38.4 % (ref 36.0–46.0)
Hemoglobin: 12.7 g/dL (ref 12.0–15.0)
MCH: 27.9 pg (ref 26.0–34.0)
MCHC: 33.1 g/dL (ref 30.0–36.0)
MCV: 84.2 fL (ref 80.0–100.0)
Platelets: 433 10*3/uL — ABNORMAL HIGH (ref 150–400)
RBC: 4.56 MIL/uL (ref 3.87–5.11)
RDW: 12.9 % (ref 11.5–15.5)
WBC: 27.2 10*3/uL — ABNORMAL HIGH (ref 4.0–10.5)
nRBC: 0 % (ref 0.0–0.2)

## 2021-08-25 LAB — CBG MONITORING, ED
Glucose-Capillary: 296 mg/dL — ABNORMAL HIGH (ref 70–99)
Glucose-Capillary: 368 mg/dL — ABNORMAL HIGH (ref 70–99)

## 2021-08-25 LAB — HEMOGLOBIN A1C
Hgb A1c MFr Bld: 11.4 % — ABNORMAL HIGH (ref 4.8–5.6)
Mean Plasma Glucose: 280.48 mg/dL

## 2021-08-25 LAB — LIPASE, BLOOD: Lipase: 20 U/L (ref 11–51)

## 2021-08-25 MED ORDER — SODIUM CHLORIDE 0.9 % IV BOLUS
1000.0000 mL | Freq: Once | INTRAVENOUS | Status: AC
  Administered 2021-08-25: 1000 mL via INTRAVENOUS

## 2021-08-25 MED ORDER — ONDANSETRON HCL 4 MG/2ML IJ SOLN
4.0000 mg | Freq: Once | INTRAMUSCULAR | Status: AC
Start: 2021-08-25 — End: 2021-08-25
  Administered 2021-08-25: 4 mg via INTRAVENOUS
  Filled 2021-08-25: qty 2

## 2021-08-25 MED ORDER — ACETAMINOPHEN 650 MG RE SUPP: 650.0000 mg | Freq: Four times a day (QID) | RECTAL | Status: DC | PRN

## 2021-08-25 MED ORDER — ONDANSETRON HCL 4 MG PO TABS: 4.0000 mg | ORAL_TABLET | Freq: Four times a day (QID) | ORAL | Status: DC | PRN

## 2021-08-25 MED ORDER — ACETAMINOPHEN 325 MG PO TABS: 650.0000 mg | ORAL_TABLET | Freq: Four times a day (QID) | ORAL | Status: DC | PRN

## 2021-08-25 MED ORDER — HYDRALAZINE HCL 20 MG/ML IJ SOLN
5.0000 mg | Freq: Four times a day (QID) | INTRAMUSCULAR | Status: DC | PRN
Start: 2021-08-25 — End: 2021-08-26
  Administered 2021-08-25 – 2021-08-26 (×2): 5 mg via INTRAVENOUS
  Filled 2021-08-25 (×2): qty 1

## 2021-08-25 MED ORDER — SODIUM CHLORIDE 0.9 % IV SOLN: INTRAVENOUS | Status: DC

## 2021-08-25 MED ORDER — IOHEXOL 300 MG/ML  SOLN
100.0000 mL | Freq: Once | INTRAMUSCULAR | Status: AC | PRN
  Administered 2021-08-25: 100 mL via INTRAVENOUS

## 2021-08-25 MED ORDER — BISACODYL 5 MG PO TBEC: 5.0000 mg | DELAYED_RELEASE_TABLET | Freq: Every day | ORAL | Status: DC | PRN

## 2021-08-25 MED ORDER — SODIUM CHLORIDE 0.9 % IV SOLN
1.0000 g | Freq: Once | INTRAVENOUS | Status: AC
  Administered 2021-08-25: 1 g via INTRAVENOUS
  Filled 2021-08-25: qty 10

## 2021-08-25 MED ORDER — INSULIN ASPART 100 UNIT/ML IJ SOLN
0.0000 [IU] | Freq: Three times a day (TID) | INTRAMUSCULAR | Status: DC
  Administered 2021-08-25: 5 [IU] via SUBCUTANEOUS
  Administered 2021-08-26: 9 [IU] via SUBCUTANEOUS
  Administered 2021-08-26: 5 [IU] via SUBCUTANEOUS
  Filled 2021-08-25 (×3): qty 1

## 2021-08-25 MED ORDER — ENOXAPARIN SODIUM 40 MG/0.4ML IJ SOSY
40.0000 mg | PREFILLED_SYRINGE | INTRAMUSCULAR | Status: DC
Start: 2021-08-25 — End: 2021-08-27
  Administered 2021-08-25 – 2021-08-26 (×2): 40 mg via SUBCUTANEOUS
  Filled 2021-08-25 (×2): qty 0.4

## 2021-08-25 MED ORDER — ONDANSETRON HCL 4 MG/2ML IJ SOLN
4.0000 mg | Freq: Four times a day (QID) | INTRAMUSCULAR | Status: DC | PRN
  Administered 2021-08-25: 4 mg via INTRAVENOUS
  Filled 2021-08-25: qty 2

## 2021-08-25 MED ORDER — SENNOSIDES-DOCUSATE SODIUM 8.6-50 MG PO TABS: 1.0000 | ORAL_TABLET | Freq: Every evening | ORAL | Status: DC | PRN

## 2021-08-25 MED ORDER — INSULIN GLARGINE-YFGN 100 UNIT/ML ~~LOC~~ SOLN
25.0000 [IU] | Freq: Every day | SUBCUTANEOUS | Status: DC
Start: 2021-08-25 — End: 2021-08-26
  Administered 2021-08-25: 25 [IU] via SUBCUTANEOUS
  Filled 2021-08-25: qty 0.25

## 2021-08-25 MED ORDER — CEFTRIAXONE SODIUM 1 G IJ SOLR
1.0000 g | INTRAMUSCULAR | Status: DC
  Administered 2021-08-26: 1 g via INTRAVENOUS
  Filled 2021-08-25 (×2): qty 10

## 2021-08-25 MED ORDER — HYDROCODONE-ACETAMINOPHEN 5-325 MG PO TABS
1.0000 | ORAL_TABLET | Freq: Four times a day (QID) | ORAL | Status: DC | PRN
  Administered 2021-08-27: 1 via ORAL
  Filled 2021-08-25: qty 1

## 2021-08-25 MED ORDER — MORPHINE SULFATE (PF) 2 MG/ML IV SOLN: 2.0000 mg | INTRAVENOUS | Status: DC | PRN

## 2021-08-25 NOTE — Assessment & Plan Note (Addendum)
Last A1c in March 11.6%, uncontrolled. Initial glucose 329 in the ED. Due to nausea vomiting, patient reports recently using less insulin than normal. Covered with basal and short-acting insulin CBG"s were at goal during admission Close PCP follow up on optimizing glycemic control.

## 2021-08-25 NOTE — Assessment & Plan Note (Signed)
No acute issues. 

## 2021-08-25 NOTE — Assessment & Plan Note (Addendum)
Resumed home amlodipine and ARB As needed IV hydralazine

## 2021-08-25 NOTE — Assessment & Plan Note (Addendum)
No significant residual deficits. Continue home meds pending med history

## 2021-08-25 NOTE — H&P (Addendum)
History and Physical    Patient: Lacey Gonzalez RJJ:[id number] DOB: Feb 02, 1955 DOA: 08/25/2021 DOS: the patient was seen and examined on 08/25/2021 PCP: [facility]  Patient coming from: Home  Chief Complaint:  Chief Complaint  Patient presents with   Vomiting   HPI: Lacey Gonzalez is a 66 y.o. female with medical history significant of insulin-dependent type 2 diabetes, hypertension, hyperlipidemia, anxiety and depression, history of CVA who presented to the ED today for evaluation of nausea vomiting.  She reported 5-day history of nausea vomiting that progressively worsened with intolerance to oral intake for the past 2 days.  Emesis was nonbloody but she reported it became dark green in color and associated with sore throat.  She denies fever/chills or any diarrhea.  She has known gallstones and thought her symptoms may be related to those.  She reported some intermittent dysuria and urinary frequency which been going on for the past couple of months.  She denies other symptoms including confusion, lethargy, headaches, cough, congestion, shortness of breath, chest pain, palpitations, dizziness or lightheadedness, unilateral weakness numbness or tingling.  ED course -- Afebrile and stable vitals other than elevated BP as high as 172/69.  Labs were notable for leukocytosis of 27.2, platelets 433, glucose 329, normal renal function, normal AST and ALT but elevated alk phos 295, elevated total bili 1.5.  UA was concerning for infection with moderate leukocytes, > 50 WBCs.  CT abdomen pelvis with contrast showed signs of bilateral pyelonephritis with mild perinephric stranding unchanged cholelithiasis without signs of acute cholecystitis, a subacute to chronic right subcapital femoral neck fracture new since imaging in May felt to be a stress fracture.  Patient was started on empiric Rocephin and admitted to hospitalist service for further evaluation and management and urine culture  results.    Review of Systems: As mentioned in the history of present illness. All other systems reviewed and are negative.  Past Medical History:  Diagnosis Date   Anxiety    DDD (degenerative disc disease), lumbar    Depression    Diabetes mellitus without complication ([place])    Gallstones    Hypertension    Stroke [facility])    History reviewed. No pertinent surgical history.  Social History:  reports that she has never smoked. She has never used smokeless tobacco. She reports current alcohol use. She reports that she does not use drugs.  No Known Allergies  History reviewed. No pertinent family history.  Prior to Admission medications   Medication Sig Start Date End Date Taking? Authorizing Provider  acetaminophen (TYLENOL) 500 MG tablet Take 500 mg by mouth in the morning and at bedtime.    [provider]  amLODipine (NORVASC) 10 MG tablet Take 1 tablet (10 mg total) by mouth daily. 10/02/20   Kathie Dike, MD  aspirin EC 81 MG EC tablet Take 1 tablet (81 mg total) by mouth daily. Swallow whole. 10/02/20   Kathie Dike, MD  atorvastatin (LIPITOR) 40 MG tablet Take 1 tablet (40 mg total) by mouth daily. 10/02/20   Kathie Dike, MD  DULoxetine (CYMBALTA) 30 MG capsule Take 30 mg by mouth daily. 08/28/20   [provider]  insulin glargine (LANTUS) 100 UNIT/ML injection Inject 40 Units into the skin at bedtime. 07/31/20 07/31/21  [provider]  JANUVIA 100 MG tablet Take 100 mg by mouth daily. 08/28/20   [provider]  metoCLOPramide (REGLAN) 10 MG tablet Take 1 tablet (10 mg total) by mouth every 8 (  eight) hours as needed for nausea or vomiting. 01/08/17 02/07/17  Arta Silence, MD  Multiple Vitamin (MULTI-VITAMIN) tablet Take 1 tablet by mouth daily.    [provider]  olmesartan (BENICAR) 20 MG tablet Take 20 mg by mouth daily. 08/28/20   [provider]  ondansetron (ZOFRAN) 4 MG tablet Take 1 tablet (4 mg total) by  mouth daily as needed for nausea or vomiting. 08/27/16   Schuyler Amor, MD  polyethylene glycol (MIRALAX / GLYCOLAX) 17 g packet Take 17 g by mouth daily. 10/02/20   Kathie Dike, MD    Physical Exam: Vitals:   08/25/21 1229 08/25/21 1510 08/25/21 1705 08/25/21 1818  BP: (!) 138/90 (!) 172/69 (!) 150/82 (!) 159/71  Pulse: 86 94 88 90  Resp: _0 Temp: 98.6 F (37 C) 98.4 F (36.9 C)  98.4 F (36.9 C)  TempSrc: Oral Oral  Oral  SpO2: 97% 100% 97% 100%  Weight: 66.7 kg     Height: _1  (1.575 m)      General exam: awake, alert, no acute distress HEENT: atraumatic, clear conjunctiva, anicteric sclera, moist mucus membranes, hearing grossly normal  Respiratory system: CTAB, no wheezes, rales or rhonchi, normal respiratory effort. Cardiovascular system: normal S1/S2, RRR, no JVD, murmurs, rubs, gallops, no pedal edema.   Gastrointestinal system: soft, mild suprapubic tenderness, non-distended, +bowel sounds. Central nervous system: A&O x3. no gross focal neurologic deficits, normal speech Extremities: moves all, no edema, normal tone Skin: dry, intact, normal temperature, normal color, no rashes, lesions or ulcers Psychiatry: normal mood, congruent affect, judgement and insight appear normal   Data Reviewed:  Labs notable for --- WBC 27.2, platelets 433, total bili 1.5, total protein 8.3, alk phos 295, anion gap 16, glucose 329, bicarb 20.  Urinalysis cloudy, glucose urea, small hemoglobin, ketones, moderate leukocytes, proteinuria, >50 WBC's  CT abdomen/pelvis-- as outlined above, bilateral pyelonephritis, cholelithiasis without cholecystitis.   Assessment and Plan: * Acute pyelonephritis Urinalysis in the ED consistent with infection and CT abdomen pelvis showed signs of bilateral pyelonephritis.  Patient reported couple month history of urinary frequency, and intermittent dysuria.  Started on empiric Rocephin in the ED. No SIRS criteria besides leukocytosis, does not  meet sepsis criteria on admission. -- Continue Rocephin -- Follow urine cultures -- Monitor fever curve, CBC -- Supportive care including pain control, antipyretics as needed -- Maintenance IV hydration until tolerating p.o. intake  Nausea and vomiting Likely due to pyelonephritis, gallstones may contribute but no signs of cholecystitis on CT scan. -- Zofran as needed -- Maintenance IV fluids until tolerating p.o. intake -- Monitor electrolytes and replace as needed  DM2 (diabetes mellitus, type 2) (HCC) Last A1c in March 11 0.6%, uncontrolled. Initial glucose 329 in the ED. Due to nausea vomiting, patient reports recently using less insulin than normal. -- Start Semglee at 25 units (home dose is 40 units) -- Sliding scale NovoLog -- Adjust insulin as needed for goal 140-180   CVA (cerebral vascular accident) ([place]) No significant residual deficits. Continue home meds pending med history  Primary hypertension Resume home meds pending med history. As needed IV hydralazine for now  Anxiety and depression Continue home meds pending med history  Cholelithiasis Known history prior to admission and confirmed on CT abdomen pelvis in the ED.  No signs of acute cholecystitis on imaging.  Monitor.      Advance Care Planning:   Code Status: Full Code    Consults: None  Family Communication:  None  Severity of Illness: The appropriate patient status for this patient is OBSERVATION. Observation status is judged to be reasonable and necessary in order to provide the required intensity of service to ensure the patient's safety. The patient's presenting symptoms, physical exam findings, and initial radiographic and laboratory data in the context of their medical condition is felt to place them at decreased risk for further clinical deterioration. Furthermore, it is anticipated that the patient will be medically stable for discharge from the hospital within 2 midnights of admission.    Author: Ezekiel Slocumb, DO 08/25/2021 6:38 PM  For on call review [email or web address].

## 2021-08-25 NOTE — ED Provider Notes (Signed)
[facility] Provider Note    Event Date/Time   First MD Initiated Contact with Patient 08/25/21 1503     (approximate)   History   Vomiting   HPI  Lacey Gonzalez is a 65 y.o. female  who presents to the emergency department today because of concern for nausea and vomiting. Symptoms started 5 days ago. For the past two days it has been more severe and she has not been able to tolerate any PO. Emesis has consisted of dark green fluid. She has not had any abdominal pain with the vomiting. No diarrhea. Last BM 2 days ago, has continued to pass gas. Denies any fevers. Feels like it is her gallbladder, since she has gallstones.    Physical Exam   Triage Vital Signs: ED Triage Vitals  Enc Vitals Group     BP 08/25/21 1229 (!) 138/90     Pulse Rate 08/25/21 1229 86     Resp 08/25/21 1229 20     Temp 08/25/21 1229 98.6 F (37 C)     Temp Source 08/25/21 1229 Oral     SpO2 08/25/21 1229 97 %     Weight 08/25/21 1229 147 lb (66.7 kg)     Height 08/25/21 1229 5\' 2"  (1.575 m)     Head Circumference --      Peak Flow --      Pain Score 08/25/21 1237 0     Pain Loc --      Pain Edu? --      Excl. in GC? --     Most recent vital signs: Vitals:   08/25/21 1229  BP: (!) 138/90  Pulse: 86  Resp: 20  Temp: 98.6 F (37 C)  SpO2: 97%   General: Awake, alert, oriented. CV:  Good peripheral perfusion. Regular rate and rhythm. Resp:  Normal effort. Lungs clear. Abd:  No distention.   ED Results / Procedures / Treatments   Labs (all labs ordered are listed, but only abnormal results are displayed) Labs Reviewed  COMPREHENSIVE METABOLIC PANEL - Abnormal; Notable for the following components:      Result Value   CO2 20 (*)    Glucose, Bld 329 (*)    Total Protein 8.3 (*)    Alkaline Phosphatase 295 (*)    Total Bilirubin 1.5 (*)    Anion gap 16 (*)    All other components within normal limits  CBC - Abnormal; Notable for the following components:    WBC 27.2 (*)    Platelets 433 (*)    All other components within normal limits  URINALYSIS, ROUTINE W REFLEX MICROSCOPIC - Abnormal; Notable for the following components:   Color, Urine YELLOW (*)    APPearance CLOUDY (*)    Glucose, UA >=500 (*)    Hgb urine dipstick SMALL (*)    Ketones, ur 80 (*)    Protein, ur 100 (*)    Leukocytes,Ua MODERATE (*)    WBC, UA >50 (*)    All other components within normal limits  LIPASE, BLOOD     EKG  None   RADIOLOGY I independently interpreted and visualized the CT abd/pel. My interpretation: No free air. Radiology interpretation:  IMPRESSION:  1. Bilateral acute pyelonephritis.  2. Subacute to chronic appearing right subcapital femoral neck  fracture with incomplete healing anteriorly and no prior fixation,  new since May by report. This may be stress related in the absence  of trauma.  3. Unchanged cholelithiasis.  PROCEDURES:  Critical Care performed: No  Procedures   MEDICATIONS ORDERED IN ED: Medications - No data to display   IMPRESSION / MDM / ASSESSMENT AND PLAN / ED COURSE  I reviewed the triage vital signs and the nursing notes.                              Differential diagnosis includes, but is not limited to, pancreatitis, hepatitis, gallbladder disease, gastroenteritis.   Patient's presentation is most consistent with acute presentation with potential threat to life or bodily function.  Patient presented to the emergency department today for nausea and vomiting. Blood work is notable for leukocytosis. UA is concerning for infection. CT abd/pel is concerning for pyelonephritis. Discussed finding with patient. Will start IV abx in the ER. Discussed with Dr. Denton Lank with the hospitalist service who will plan on admission.   FINAL CLINICAL IMPRESSION(S) / ED DIAGNOSES   Final diagnoses:  Pyelonephritis  Nausea and vomiting, unspecified vomiting type      Note:  This document was prepared using Dragon  voice recognition software and may include unintentional dictation errors.    Phineas Semen, MD 08/25/21 [phone]

## 2021-08-25 NOTE — Assessment & Plan Note (Signed)
Known history prior to admission and confirmed on CT abdomen pelvis in the ED.  No signs of acute cholecystitis on imaging.  Monitor.

## 2021-08-25 NOTE — Assessment & Plan Note (Addendum)
Likely due to pyelonephritis, gallstones may contribute but no signs of cholecystitis on CT scan. -- Zofran as needed -- Off IV fluids and tolerating p.o. intake well -- Monitor electrolytes and replace as needed

## 2021-08-25 NOTE — ED Triage Notes (Signed)
C/O vomiting x 5 days.  Unable to tolerate PO x 2 days.

## 2021-08-25 NOTE — Progress Notes (Signed)
       CROSS COVER NOTE  NAME: Lacey Gonzalez MRN: [id number] DOB : Feb 15, 1955    Date of Service   08/25/2021   HPI/Events of Note   Contacted by pharmacy inquiring if M(r)s Scheck should have orders for antibiotics. M(r)s Freas received one dose of Ceftriaxone in [facility] ED.   2300: BP 182/79, Med reconcilitation remains incomplete  0450: BP 185/71, Med reconciliation now competed  Interventions   Plan:  Pyelonephritis Continue Rocephin Compazine x1 for nausea refractory to zofran  Hypertension 10 mg IV Hydralazine x1 Home Amlodipine ordered x1, Avapro ordered x1 as formulary alternative to home Olmesartan      This document was prepared using Dragon voice recognition software and may include unintentional dictation errors.  Bishop Limbo DNP, MHA, FNP-BC Nurse Practitioner [facility] Pager [phone]

## 2021-08-25 NOTE — Assessment & Plan Note (Addendum)
Urinalysis in the ED consistent with infection and CT abdomen pelvis showed signs of bilateral pyelonephritis.  Patient reported couple month history of urinary frequency, and intermittent dysuria.  Started on empiric Rocephin in the ED. No SIRS criteria besides leukocytosis, does not meet sepsis criteria on admission. Urine culture grew Klebsiella oxytoca. -- Treated with empiric Rocephin -- Discharged on Duricef to complete 10 day course -- Supportive care including pain control, antipyretics as needed -- Tolerating PO intake off IV fluids --PCP follow up in 1-2 weeks

## 2021-08-26 ENCOUNTER — Encounter: Payer: Self-pay | Admitting: Internal Medicine

## 2021-08-26 DIAGNOSIS — E785 Hyperlipidemia, unspecified: Secondary | ICD-10-CM | POA: Diagnosis present

## 2021-08-26 DIAGNOSIS — F32A Depression, unspecified: Secondary | ICD-10-CM | POA: Diagnosis present

## 2021-08-26 DIAGNOSIS — Z7982 Long term (current) use of aspirin: Secondary | ICD-10-CM | POA: Diagnosis not present

## 2021-08-26 DIAGNOSIS — K8020 Calculus of gallbladder without cholecystitis without obstruction: Secondary | ICD-10-CM | POA: Diagnosis present

## 2021-08-26 DIAGNOSIS — R112 Nausea with vomiting, unspecified: Secondary | ICD-10-CM

## 2021-08-26 DIAGNOSIS — Z8673 Personal history of transient ischemic attack (TIA), and cerebral infarction without residual deficits: Secondary | ICD-10-CM | POA: Diagnosis not present

## 2021-08-26 DIAGNOSIS — B9689 Other specified bacterial agents as the cause of diseases classified elsewhere: Secondary | ICD-10-CM | POA: Diagnosis present

## 2021-08-26 DIAGNOSIS — Z79899 Other long term (current) drug therapy: Secondary | ICD-10-CM | POA: Diagnosis not present

## 2021-08-26 DIAGNOSIS — E1110 Type 2 diabetes mellitus with ketoacidosis without coma: Secondary | ICD-10-CM | POA: Diagnosis present

## 2021-08-26 DIAGNOSIS — F419 Anxiety disorder, unspecified: Secondary | ICD-10-CM | POA: Diagnosis present

## 2021-08-26 DIAGNOSIS — Z794 Long term (current) use of insulin: Secondary | ICD-10-CM | POA: Diagnosis not present

## 2021-08-26 DIAGNOSIS — E8729 Other acidosis: Secondary | ICD-10-CM | POA: Diagnosis not present

## 2021-08-26 DIAGNOSIS — I10 Essential (primary) hypertension: Secondary | ICD-10-CM | POA: Diagnosis present

## 2021-08-26 DIAGNOSIS — N10 Acute tubulo-interstitial nephritis: Secondary | ICD-10-CM | POA: Diagnosis present

## 2021-08-26 HISTORY — DX: Other acidosis: E87.29

## 2021-08-26 LAB — CBC
HCT: 37.5 % (ref 36.0–46.0)
Hemoglobin: 12 g/dL (ref 12.0–15.0)
MCH: 27.3 pg (ref 26.0–34.0)
MCHC: 32 g/dL (ref 30.0–36.0)
MCV: 85.4 fL (ref 80.0–100.0)
Platelets: 408 10*3/uL — ABNORMAL HIGH (ref 150–400)
RBC: 4.39 MIL/uL (ref 3.87–5.11)
RDW: 13 % (ref 11.5–15.5)
WBC: 28.3 10*3/uL — ABNORMAL HIGH (ref 4.0–10.5)
nRBC: 0 % (ref 0.0–0.2)

## 2021-08-26 LAB — CBG MONITORING, ED
Glucose-Capillary: 368 mg/dL — ABNORMAL HIGH (ref 70–99)
Glucose-Capillary: 283 mg/dL — ABNORMAL HIGH (ref 70–99)
Glucose-Capillary: 250 mg/dL — ABNORMAL HIGH (ref 70–99)

## 2021-08-26 LAB — BLOOD GAS, VENOUS
Acid-base deficit: 4.5 mmol/L — ABNORMAL HIGH (ref 0.0–2.0)
Bicarbonate: 19.2 mmol/L — ABNORMAL LOW (ref 20.0–28.0)
O2 Saturation: 93.6 %
Patient temperature: 37
pCO2, Ven: 31 mmHg — ABNORMAL LOW (ref 44–60)
pH, Ven: 7.4 (ref 7.25–7.43)
pO2, Ven: 62 mmHg — ABNORMAL HIGH (ref 32–45)

## 2021-08-26 LAB — BASIC METABOLIC PANEL
Anion gap: 15 (ref 5–15)
Anion gap: 7 (ref 5–15)
BUN: 15 mg/dL (ref 8–23)
BUN: 17 mg/dL (ref 8–23)
CO2: 12 mmol/L — ABNORMAL LOW (ref 22–32)
CO2: 20 mmol/L — ABNORMAL LOW (ref 22–32)
Calcium: 8.4 mg/dL — ABNORMAL LOW (ref 8.9–10.3)
Calcium: 9.5 mg/dL (ref 8.9–10.3)
Chloride: 113 mmol/L — ABNORMAL HIGH (ref 98–111)
Chloride: 111 mmol/L (ref 98–111)
Creatinine, Ser: 0.7 mg/dL (ref 0.44–1.00)
Creatinine, Ser: 0.68 mg/dL (ref 0.44–1.00)
GFR, Estimated: >60 mL/min (ref >60)
GFR, Estimated: >60 mL/min (ref >60)
Glucose, Bld: 349 mg/dL — ABNORMAL HIGH (ref 70–99)
Glucose, Bld: 274 mg/dL — ABNORMAL HIGH (ref 70–99)
Potassium: 3 mmol/L — ABNORMAL LOW (ref 3.5–5.1)
Potassium: 3.6 mmol/L (ref 3.5–5.1)
Sodium: 140 mmol/L (ref 135–145)
Sodium: 138 mmol/L (ref 135–145)

## 2021-08-26 LAB — GLUCOSE, CAPILLARY
Glucose-Capillary: 144 mg/dL — ABNORMAL HIGH (ref 70–99)
Glucose-Capillary: 188 mg/dL — ABNORMAL HIGH (ref 70–99)
Glucose-Capillary: 232 mg/dL — ABNORMAL HIGH (ref 70–99)

## 2021-08-26 LAB — BETA-HYDROXYBUTYRIC ACID: Beta-Hydroxybutyric Acid: 5.27 mmol/L — ABNORMAL HIGH (ref 0.05–0.27)

## 2021-08-26 MED ORDER — ESCITALOPRAM OXALATE 10 MG PO TABS
10.0000 mg | ORAL_TABLET | Freq: Every day | ORAL | Status: DC
  Administered 2021-08-26 – 2021-08-27 (×2): 10 mg via ORAL
  Filled 2021-08-26 (×2): qty 1

## 2021-08-26 MED ORDER — AMLODIPINE BESYLATE 5 MG PO TABS
10.0000 mg | ORAL_TABLET | Freq: Once | ORAL | Status: AC
  Administered 2021-08-26: 10 mg via ORAL
  Filled 2021-08-26: qty 2

## 2021-08-26 MED ORDER — SODIUM CHLORIDE 0.9 % IV SOLN
12.5000 mg | Freq: Once | INTRAVENOUS | Status: AC
  Administered 2021-08-26: 12.5 mg via INTRAVENOUS
  Filled 2021-08-26: qty 12.5

## 2021-08-26 MED ORDER — IRBESARTAN 150 MG PO TABS
75.0000 mg | ORAL_TABLET | Freq: Every day | ORAL | Status: DC
  Administered 2021-08-26 – 2021-08-27 (×2): 75 mg via ORAL
  Filled 2021-08-26 (×2): qty 1

## 2021-08-26 MED ORDER — INSULIN ASPART 100 UNIT/ML IJ SOLN
6.0000 [IU] | Freq: Once | INTRAMUSCULAR | Status: AC
Start: 2021-08-26 — End: 2021-08-26
  Administered 2021-08-26: 6 [IU] via SUBCUTANEOUS
  Filled 2021-08-26: qty 1

## 2021-08-26 MED ORDER — INSULIN ASPART 100 UNIT/ML IJ SOLN
0.0000 [IU] | INTRAMUSCULAR | Status: DC
  Administered 2021-08-26: 3 [IU] via SUBCUTANEOUS
  Administered 2021-08-26: 2 [IU] via SUBCUTANEOUS
  Administered 2021-08-26 – 2021-08-27 (×4): 1 [IU] via SUBCUTANEOUS
  Filled 2021-08-26 (×4): qty 1

## 2021-08-26 MED ORDER — AMLODIPINE BESYLATE 10 MG PO TABS
10.0000 mg | ORAL_TABLET | Freq: Every day | ORAL | Status: DC
  Administered 2021-08-26 – 2021-08-27 (×2): 10 mg via ORAL
  Filled 2021-08-26: qty 2
  Filled 2021-08-26: qty 1

## 2021-08-26 MED ORDER — DULOXETINE HCL 30 MG PO CPEP
30.0000 mg | ORAL_CAPSULE | Freq: Every day | ORAL | Status: DC
  Administered 2021-08-26 – 2021-08-27 (×2): 30 mg via ORAL
  Filled 2021-08-26 (×2): qty 1

## 2021-08-26 MED ORDER — OXYBUTYNIN CHLORIDE ER 5 MG PO TB24
5.0000 mg | ORAL_TABLET | Freq: Every day | ORAL | Status: DC
  Administered 2021-08-26 – 2021-08-27 (×2): 5 mg via ORAL
  Filled 2021-08-26 (×2): qty 1

## 2021-08-26 MED ORDER — POLYETHYLENE GLYCOL 3350 17 GM/SCOOP PO POWD
17.0000 g | Freq: Every day | ORAL | Status: DC
  Filled 2021-08-26: qty 255

## 2021-08-26 MED ORDER — ADULT MULTIVITAMIN W/MINERALS CH
1.0000 | ORAL_TABLET | Freq: Every day | ORAL | Status: DC
  Administered 2021-08-26 – 2021-08-27 (×2): 1 via ORAL
  Filled 2021-08-26 (×2): qty 1

## 2021-08-26 MED ORDER — IRBESARTAN 150 MG PO TABS
150.0000 mg | ORAL_TABLET | Freq: Once | ORAL | Status: AC
  Administered 2021-08-26: 150 mg via ORAL
  Filled 2021-08-26: qty 1

## 2021-08-26 MED ORDER — LACTATED RINGERS IV SOLN: INTRAVENOUS | Status: DC

## 2021-08-26 MED ORDER — HYDRALAZINE HCL 20 MG/ML IJ SOLN
10.0000 mg | Freq: Once | INTRAMUSCULAR | Status: AC
  Administered 2021-08-26: 10 mg via INTRAVENOUS
  Filled 2021-08-26: qty 1

## 2021-08-26 MED ORDER — LINAGLIPTIN 5 MG PO TABS
5.0000 mg | ORAL_TABLET | Freq: Every day | ORAL | Status: DC
  Administered 2021-08-26: 5 mg via ORAL
  Filled 2021-08-26: qty 1

## 2021-08-26 MED ORDER — POTASSIUM CHLORIDE 10 MEQ/100ML IV SOLN
10.0000 meq | INTRAVENOUS | Status: AC
  Administered 2021-08-26 (×3): 10 meq via INTRAVENOUS
  Filled 2021-08-26: qty 100

## 2021-08-26 MED ORDER — HYDRALAZINE HCL 20 MG/ML IJ SOLN: 10.0000 mg | Freq: Four times a day (QID) | INTRAMUSCULAR | Status: DC | PRN

## 2021-08-26 MED ORDER — GABAPENTIN 300 MG PO CAPS
300.0000 mg | ORAL_CAPSULE | Freq: Every day | ORAL | Status: DC
  Administered 2021-08-26 – 2021-08-27 (×2): 300 mg via ORAL
  Filled 2021-08-26 (×2): qty 1

## 2021-08-26 MED ORDER — LACTATED RINGERS IV BOLUS
1000.0000 mL | Freq: Once | INTRAVENOUS | Status: AC
  Administered 2021-08-26: 1000 mL via INTRAVENOUS

## 2021-08-26 MED ORDER — PROCHLORPERAZINE EDISYLATE 10 MG/2ML IJ SOLN
10.0000 mg | Freq: Four times a day (QID) | INTRAMUSCULAR | Status: DC | PRN
Start: 2021-08-26 — End: 2021-08-27
  Administered 2021-08-26: 10 mg via INTRAVENOUS
  Filled 2021-08-26: qty 2

## 2021-08-26 MED ORDER — POLYETHYLENE GLYCOL 3350 17 G PO PACK
17.0000 g | PACK | Freq: Every day | ORAL | Status: DC
  Administered 2021-08-26 – 2021-08-27 (×2): 17 g via ORAL
  Filled 2021-08-26 (×2): qty 1

## 2021-08-26 MED ORDER — ATORVASTATIN CALCIUM 20 MG PO TABS
40.0000 mg | ORAL_TABLET | Freq: Every day | ORAL | Status: DC
  Administered 2021-08-26 – 2021-08-27 (×2): 40 mg via ORAL
  Filled 2021-08-26 (×2): qty 2

## 2021-08-26 MED ORDER — BUSPIRONE HCL 10 MG PO TABS
5.0000 mg | ORAL_TABLET | Freq: Two times a day (BID) | ORAL | Status: DC
  Administered 2021-08-26 – 2021-08-27 (×3): 5 mg via ORAL
  Filled 2021-08-26 (×3): qty 1

## 2021-08-26 MED ORDER — INSULIN GLARGINE-YFGN 100 UNIT/ML ~~LOC~~ SOLN
40.0000 [IU] | Freq: Every day | SUBCUTANEOUS | Status: DC
Start: 2021-08-26 — End: 2021-08-27
  Administered 2021-08-26: 40 [IU] via SUBCUTANEOUS
  Filled 2021-08-26 (×2): qty 0.4

## 2021-08-26 NOTE — Progress Notes (Signed)
Progress Note   Patient: Lacey Gonzalez:[id number] DOB: 07-May-1955 DOA: 08/25/2021     0 DOS: the patient was seen and examined on 08/26/2021   Brief hospital course: NOHELI MELDER is a 66 y.o. female with medical history significant of insulin-dependent type 2 diabetes, hypertension, hyperlipidemia, anxiety and depression, history of CVA who presented to the ED today for evaluation of nausea vomiting.  She reported 5-day history of nausea vomiting that progressively worsened with intolerance to oral intake for the past 2 days.  Emesis was nonbloody but she reported it became dark green in color and associated with sore throat.  She denies fever/chills or any diarrhea.  She has known gallstones and thought her symptoms may be related to those.  She reported some intermittent dysuria and urinary frequency which been going on for the past couple of months.  Evaluation in the ED including UA and CT scan were consistent with UTI and bilateral pyelonephritis.  Gallstones were seen on CT, but without signs of cholecystitis.  Admitted to the hospital and started on empiric IV Rocephin pending urine culture.  Assessment and Plan: * Acute pyelonephritis Urinalysis in the ED consistent with infection and CT abdomen pelvis showed signs of bilateral pyelonephritis.  Patient reported couple month history of urinary frequency, and intermittent dysuria.  Started on empiric Rocephin in the ED. No SIRS criteria besides leukocytosis, does not meet sepsis criteria on admission. Urine culture growing Klebsiella oxytoca. -- Continue Rocephin -- Follow urine cultures -- Monitor fever curve, CBC -- Supportive care including pain control, antipyretics as needed -- Maintenance IV hydration until tolerating p.o. intake  High anion gap metabolic acidosis On admission, bicarb slightly low at 20 with gap 16. Suspect due to nausea/vomiting and not tolerating PO intake for a few days --> starvation ketoacidosis (felt  more likely than DKA)  Today CO2 dropped to 12, gap improved to 15. Pt has had ongoing nausea/vomiting overnight. --checking VBG, beta-hydroxybutyric acid, and repeat BMP stat --IV fluid bolus  --increased [place] insulin for now, but might need insulin drip --monitor closely  Nausea and vomiting Likely due to pyelonephritis, gallstones may contribute but no signs of cholecystitis on CT scan. -- Zofran as needed -- Maintenance IV fluids until tolerating p.o. intake -- Monitor electrolytes and replace as needed  DM2 (diabetes mellitus, type 2) (HCC) Last A1c in March 11.6%, uncontrolled. Initial glucose 329 in the ED. Due to nausea vomiting, patient reports recently using less insulin than normal. -- Increase Semglee to home dose 40 units qHS -- Sliding scale NovoLog and CBG's Q4H -- Adjust insulin as needed for goal 140-180 --Appreciate diabetes coordinator following   CVA (cerebral vascular accident) (HCC) No significant residual deficits. Continue home meds pending med history  Primary hypertension Resumed home amlodipine and ARB As needed IV hydralazine  Anxiety and depression Continue home meds pending med history  Cholelithiasis Known history prior to admission and confirmed on CT abdomen pelvis in the ED.  No signs of acute cholecystitis on imaging.  Monitor.        Subjective: Pt seen this AM in the ED holding for bed.  She was sleeping and a bit difficult to arouse initially.  She reports "finally got to sleep" she reported overnight having "right much" nausea and vomiting.  Physical Exam: Vitals:   08/26/21 0657 08/26/21 0800 08/26/21 1100 08/26/21 1400  BP:  (!) 177/76 (!) 144/68 (!) 142/75  Pulse:  (!) 114 (!) 101 (!) 105  Resp:  (!) 25 (!)  23 19  Temp: 98.5 F (36.9 C) 98.7 F (37.1 C) 98.7 F (37.1 C) 98.5 F (36.9 C)  TempSrc: Oral Oral Oral Oral  SpO2:  99% 97% 93%  Weight:      Height:       General exam: Sleeping comfortably, initially  difficult to awaken, no acute distress, mildly ill-appearing HEENT: moist mucus membranes, hearing grossly normal  Respiratory system: CTAB, no wheezes, rales or rhonchi, normal respiratory effort. Cardiovascular system: normal S1/S2, RRR, no pedal edema.   Gastrointestinal system: soft, nontender, nondistended abdomen Central nervous system: no gross focal neurologic deficits, normal speech Extremities: moves all, no edema, normal tone Skin: dry, intact, normal temperature Psychiatry: normal mood, congruent affect, judgement and insight appear normal   Data Reviewed:  Notable labs:   Potassium 3.0, chloride 113, bicarb 12 down from 20, glucose 349, calcium 8.4, anion gap 15 from 16.  WBC 28.3 from 27.2, platelets 408.    VBG and repeat BMP pending  Family Communication: None at bedside, will attempt to call  Disposition:  Status is: Inpatient Remains inpatient appropriate because: Ongoing nausea vomiting requiring IV medications and IV fluids, remains on empiric IV antibiotics pending urine culture results     Planned Discharge Destination: Home    Time spent: 60 minutes including time spent at bedside and in coordination of care  Author: Pennie Banter, DO 08/26/2021 3:24 PM  For on call review [email or web address].

## 2021-08-26 NOTE — Plan of Care (Signed)
  Problem: Education: Goal: Ability to describe self-care measures that may prevent or decrease complications (Diabetes Survival Skills Education) will improve Outcome: Progressing   

## 2021-08-26 NOTE — Inpatient Diabetes Management (Addendum)
Inpatient Diabetes Program Recommendations  AACE/ADA: New Consensus Statement on Inpatient Glycemic Control (2015)  Target Ranges:  Prepandial:   less than 140 mg/dL      Peak postprandial:   less than 180 mg/dL (1-2 hours)      Critically ill patients:  140 - 180 mg/dL    Latest Reference Range & Units 09/21/20 04:24 08/25/21 12:38  Hemoglobin A1C 4.8 - 5.6 % 11.6 (H) 11.4 (H)  280 mg/dl  (H): Data is abnormally high  Latest Reference Range & Units 08/25/21 12:38  Glucose 70 - 99 mg/dL 136 (H)  (H): Data is abnormally high  Latest Reference Range & Units 08/25/21 18:10 08/25/21 22:28 08/26/21 07:47  Glucose-Capillary 70 - 99 mg/dL 438 (H)  5 units Novolog  368 (H)   25 units Semglee 368 (H)  9 units Novolog   (H): Data is abnormally high    Admit with:  Nausea and vomiting Acute pyelonephritis  History: DM2, CVA  Home DM Meds: Lantus 40 units QHS        Januvia 100 mg daily  Current Orders: Semglee 25 units QHS     Novolog Sensitive Correction Scale/ SSI (0-9 units) TID AC      MD- Note 5:27am BMET shows CO2 level dropped to 12 (anion gap 15).  Do you think pt needs more IVF?  Also, note AM CBG today is 368  Please consider increasing the Semglee to 35 units QHS (pt takes 40 units Lantus at home)  Please consider giving pt an extra 10 units Semglee today X 1 dose    --Will follow patient during hospitalization--  Ambrose Finland RN, MSN, CDCES Diabetes Coordinator Inpatient Glycemic Control Team Team Pager: [phone] (8a-5p)

## 2021-08-26 NOTE — Hospital Course (Signed)
[name] is a 66 y.o. female with medical history significant of insulin-dependent type 2 diabetes, hypertension, hyperlipidemia, anxiety and depression, history of CVA who presented to the ED today for evaluation of nausea vomiting.  She reported 5-day history of nausea vomiting that progressively worsened with intolerance to oral intake for the past 2 days.  Emesis was nonbloody but she reported it became dark green in color and associated with sore throat.  She denies fever/chills or any diarrhea.  She has known gallstones and thought her symptoms may be related to those.  She reported some intermittent dysuria and urinary frequency which been going on for the past couple of months.  Evaluation in the ED including UA and CT scan were consistent with UTI and bilateral pyelonephritis.  Gallstones were seen on CT, but without signs of cholecystitis.  Admitted to the hospital and started on empiric IV Rocephin pending urine culture.

## 2021-08-26 NOTE — ED Notes (Signed)
Pt BP high, 172/67. Bishop Limbo NP made aware

## 2021-08-26 NOTE — Assessment & Plan Note (Addendum)
Resolved.  On admission, bicarb slightly low at 20 with gap 16. Suspect due to nausea/vomiting and not tolerating PO intake for a few days --> starvation ketoacidosis (felt more likely than DKA) Resolved with aggressive hydration and treating of infection.  Tolerating PO intake, stable.   --Repeat BMP in 1-2 weeks

## 2021-08-27 LAB — BASIC METABOLIC PANEL
Anion gap: 11 (ref 5–15)
BUN: 15 mg/dL (ref 8–23)
CO2: 19 mmol/L — ABNORMAL LOW (ref 22–32)
Calcium: 9.6 mg/dL (ref 8.9–10.3)
Chloride: 108 mmol/L (ref 98–111)
Creatinine, Ser: 0.57 mg/dL (ref 0.44–1.00)
GFR, Estimated: >60 mL/min (ref >60)
Glucose, Bld: 147 mg/dL — ABNORMAL HIGH (ref 70–99)
Potassium: 3.1 mmol/L — ABNORMAL LOW (ref 3.5–5.1)
Sodium: 138 mmol/L (ref 135–145)

## 2021-08-27 LAB — GLUCOSE, CAPILLARY
Glucose-Capillary: 144 mg/dL — ABNORMAL HIGH (ref 70–99)
Glucose-Capillary: 124 mg/dL — ABNORMAL HIGH (ref 70–99)
Glucose-Capillary: 134 mg/dL — ABNORMAL HIGH (ref 70–99)

## 2021-08-27 LAB — URINE CULTURE: Culture: 10000 — AB

## 2021-08-27 LAB — CBC
HCT: 33.6 % — ABNORMAL LOW (ref 36.0–46.0)
Hemoglobin: 11.1 g/dL — ABNORMAL LOW (ref 12.0–15.0)
MCH: 27.5 pg (ref 26.0–34.0)
MCHC: 33 g/dL (ref 30.0–36.0)
MCV: 83.4 fL (ref 80.0–100.0)
Platelets: 426 10*3/uL — ABNORMAL HIGH (ref 150–400)
RBC: 4.03 MIL/uL (ref 3.87–5.11)
RDW: 13.1 % (ref 11.5–15.5)
WBC: 24.4 10*3/uL — ABNORMAL HIGH (ref 4.0–10.5)
nRBC: 0 % (ref 0.0–0.2)

## 2021-08-27 LAB — MAGNESIUM: Magnesium: 1.8 mg/dL (ref 1.7–2.4)

## 2021-08-27 LAB — PHOSPHORUS: Phosphorus: 1.5 mg/dL — ABNORMAL LOW (ref 2.5–4.6)

## 2021-08-27 MED ORDER — POTASSIUM PHOSPHATES 15 MMOLE/5ML IV SOLN
30.0000 mmol | Freq: Once | INTRAVENOUS | Status: AC
  Administered 2021-08-27: 30 mmol via INTRAVENOUS
  Filled 2021-08-27: qty 10

## 2021-08-27 MED ORDER — POTASSIUM CHLORIDE CRYS ER 20 MEQ PO TBCR
40.0000 meq | EXTENDED_RELEASE_TABLET | Freq: Once | ORAL | Status: AC
  Administered 2021-08-27: 40 meq via ORAL
  Filled 2021-08-27: qty 2

## 2021-08-27 MED ORDER — CEFADROXIL 500 MG PO CAPS: 500.0000 mg | ORAL_CAPSULE | Freq: Two times a day (BID) | ORAL | 0 refills | Status: AC

## 2021-08-27 MED ORDER — POTASSIUM CHLORIDE 2 MEQ/ML IV SOLN
INTRAVENOUS | Status: AC
  Filled 2021-08-27: qty 1000

## 2021-08-27 NOTE — Inpatient Diabetes Management (Addendum)
Inpatient Diabetes Program Recommendations  AACE/ADA: New Consensus Statement on Inpatient Glycemic Control (2015)  Target Ranges:  Prepandial:   less than 140 mg/dL      Peak postprandial:   less than 180 mg/dL (1-2 hours)      Critically ill patients:  140 - 180 mg/dL    Latest Reference Range & Units 09/21/20 04:24 08/25/21 12:38  Hemoglobin A1C 4.8 - 5.6 % 11.6 (H) 11.4 (H)   280 mg/dl    Latest Reference Range & Units 08/26/21 23:48 08/27/21 04:45 08/27/21 08:05 08/27/21 11:25  Glucose-Capillary 70 - 99 mg/dL 144 (H) 144 (H) 124 (H) 134 (H)  (H): Data is abnormally high   Home DM Meds: Lantus 40 units QHS                              Januvia 100 mg daily   Current Orders: Semglee 40 units QHS                           Novolog Sensitive Correction Scale/ SSI (0-9 units) Q4 hours    MD- Pt stated she needs refills on her CBG meter strips when she goes home    Met w/ pt at Bedside this AM.  Pt told me she takes Lantus insulin BID at home (40 units AM/ 22 units PM) + Januvia Daily.  Pt also told me she is supposed to be taking Novolog 7 units TID with meals but forgets to take the Novolog and rarely takes any doses.  Says she is forgetful with the Novolog but does remember the Lantus and Januvia.  Sees Dr. Edwina Barth for medical care--Thinks she has an appt with PCP in November.  NOT checking CBGs at home for the last several weeks due to running out of meter strips.    Spoke with patient about her current A1c of 11.4%.  Explained what an A1c is and what it measures.  Reminded patient that her goal A1c is 7% or less per ADA standards to prevent both acute and long-term complications.  Also reviewed goal CBGs for home.  Explained to patient the extreme importance of good glucose control at home especially in helping her heal from her infection and preventing further infections and long-term complications.  We had an honest discussion about how her poor glucose control may have  pre-disposed her to this infection and how her health will not improve without better CBG control.  Encouraged patient to check her CBGs at least TID [facility] and to record all CBGs in a logbook for her PCP to review.  Strongly encouraged pt to restart taking her Novolog consistently at home and to make sure to attend her next PCP appt.  Pt appreciative of info and stated she did not have any questions for me.   --Will follow patient during hospitalization--  Wyn Quaker RN, MSN, [place] Diabetes Coordinator Inpatient Glycemic Control Team Team Pager: [phone] (8a-5p)

## 2021-08-27 NOTE — Progress Notes (Signed)
Mobility Specialist - Progress Note    08/27/21 1000  Mobility  Activity Ambulated with assistance to bathroom;Stood at bedside;Dangled on edge of bed  Level of Assistance Standby assist, set-up cues, supervision of patient - no hands on  Assistive Device Front wheel walker  Distance Ambulated (ft) 10 ft  Activity Response Tolerated well  $Mobility charge 1 Mobility    Pt lying in bed upon arrival using RA. Completes bed mobility ModI + extra time and use of rails to dangle EOB. Completes STS SBA and ambulates to toilet with supervision. Pt is left with RN present.  Clarisa Schools Mobility Specialist 08/27/21, 10:38 AM

## 2021-09-07 ENCOUNTER — Encounter: Payer: Self-pay | Admitting: Internal Medicine

## 2021-09-07 NOTE — Discharge Summary (Signed)
Physician Discharge Summary   Patient: Lacey Gonzalez MRN: [id number] DOB: 25-Apr-1955  Admit date:     08/25/2021  Discharge date: 08/27/2021  Discharge Physician: Pennie Banter   PCP: [facility]   Recommendations at discharge:    Follow up with Primary Care in 1-2 weeks Repeat BMP, CBC, Mg in 1-2 weeks Follow up on optimizing glycemic control   Discharge Diagnoses: Principal Problem:   Acute pyelonephritis Active Problems:   DM2 (diabetes mellitus, type 2) (HCC)   CVA (cerebral vascular accident) (HCC)   Primary hypertension   Anxiety and depression   Cholelithiasis  Resolved Problems:   Nausea and vomiting   High anion gap metabolic acidosis  Hospital Course: Lacey Gonzalez is a 66 y.o. female with medical history significant of insulin-dependent type 2 diabetes, hypertension, hyperlipidemia, anxiety and depression, history of CVA who presented to the ED today for evaluation of nausea vomiting.  She reported 5-day history of nausea vomiting that progressively worsened with intolerance to oral intake for the past 2 days.  Emesis was nonbloody but she reported it became dark green in color and associated with sore throat.  She denies fever/chills or any diarrhea.  She has known gallstones and thought her symptoms may be related to those.  She reported some intermittent dysuria and urinary frequency which been going on for the past couple of months.  Evaluation in the ED including UA and CT scan were consistent with UTI and bilateral pyelonephritis.  Gallstones were seen on CT, but without signs of cholecystitis.  Admitted to the hospital and started on empiric IV Rocephin pending urine culture.  Assessment and Plan: * Acute pyelonephritis Urinalysis in the ED consistent with infection and CT abdomen pelvis showed signs of bilateral pyelonephritis.  Patient reported couple month history of urinary frequency, and intermittent dysuria.  Started on empiric Rocephin in  the ED. No SIRS criteria besides leukocytosis, does not meet sepsis criteria on admission. Urine culture grew Klebsiella oxytoca. -- Treated with empiric Rocephin -- Discharged on Duricef to complete 10 day course -- Supportive care including pain control, antipyretics as needed -- Tolerating PO intake off IV fluids --PCP follow up in 1-2 weeks  High anion gap metabolic acidosis-resolved as of 09/07/2021 Resolved.  On admission, bicarb slightly low at 20 with gap 16. Suspect due to nausea/vomiting and not tolerating PO intake for a few days --> starvation ketoacidosis (felt more likely than DKA) Resolved with aggressive hydration and treating of infection.  Tolerating PO intake, stable.   --Repeat BMP in 1-2 weeks  Nausea and vomiting-resolved as of 09/07/2021 Likely due to pyelonephritis, gallstones may contribute but no signs of cholecystitis on CT scan. -- Zofran as needed -- Off IV fluids and tolerating p.o. intake well -- Monitor electrolytes and replace as needed  DM2 (diabetes mellitus, type 2) (HCC) Last A1c in March 11.6%, uncontrolled. Initial glucose 329 in the ED. Due to nausea vomiting, patient reports recently using less insulin than normal. Covered with basal and short-acting insulin CBG"s were at goal during admission Close PCP follow up on optimizing glycemic control.  CVA (cerebral vascular accident) (HCC) No significant residual deficits. Continue home meds pending med history  Primary hypertension Resumed home amlodipine and ARB As needed IV hydralazine  Anxiety and depression Continue home meds pending med history  Cholelithiasis Known history prior to admission and confirmed on CT abdomen pelvis in the ED.  No signs of acute cholecystitis on imaging.  Monitor.  Consultants: None  Procedures performed: None  Disposition: Home Diet recommendation:  Discharge Diet Orders (From admission, onward)     Start     Ordered   08/27/21 0000  Diet -  low sodium heart healthy        08/27/21 1248           Cardiac and Carb modified diet DISCHARGE MEDICATION: Allergies as of 08/27/2021   No Known Allergies      Medication List     TAKE these medications    acetaminophen 500 MG tablet Commonly known as: TYLENOL Take 500 mg by mouth in the morning and at bedtime.   amLODipine 10 MG tablet Commonly known as: NORVASC Take 1 tablet (10 mg total) by mouth daily.   aspirin EC 81 MG tablet Take 1 tablet (81 mg total) by mouth daily. Swallow whole.   atorvastatin 40 MG tablet Commonly known as: LIPITOR Take 1 tablet (40 mg total) by mouth daily.   busPIRone 5 MG tablet Commonly known as: BUSPAR Take 5 mg by mouth 2 (two) times daily.   DULoxetine 30 MG capsule Commonly known as: CYMBALTA Take 30 mg by mouth daily.   escitalopram 10 MG tablet Commonly known as: LEXAPRO Take 10 mg by mouth daily.   gabapentin 300 MG capsule Commonly known as: NEURONTIN Take 300 mg by mouth daily.   insulin glargine 100 UNIT/ML injection Commonly known as: LANTUS Inject 40 Units into the skin at bedtime.   Januvia 100 MG tablet Generic drug: sitaGLIPtin Take 100 mg by mouth daily.   meloxicam 15 MG tablet Commonly known as: MOBIC Take 15 mg by mouth daily.   metoCLOPramide 10 MG tablet Commonly known as: REGLAN Take 1 tablet (10 mg total) by mouth every 8 (eight) hours as needed for nausea or vomiting.   Multi-Vitamin tablet Take 1 tablet by mouth daily.   olmesartan 20 MG tablet Commonly known as: BENICAR Take 20 mg by mouth daily.   ondansetron 4 MG tablet Commonly known as: Zofran Take 1 tablet (4 mg total) by mouth daily as needed for nausea or vomiting.   ondansetron 8 MG tablet Commonly known as: ZOFRAN Take 8 mg by mouth 3 (three) times daily.   oxybutynin 5 MG 24 hr tablet Commonly known as: DITROPAN-XL Take 5 mg by mouth daily.   polyethylene glycol powder 17 GM/SCOOP powder Commonly known as:  GLYCOLAX/MIRALAX Take 17 g by mouth daily.   senna 8.6 MG tablet Commonly known as: SENOKOT       ASK your doctor about these medications    cefadroxil 500 MG capsule Commonly known as: DURICEF Take 1 capsule (500 mg total) by mouth 2 (two) times daily for 8 days. Ask about: Should I take this medication?        Discharge Exam: Filed Weights   08/25/21 1229  Weight: 66.7 kg   General exam: awake, alert, no acute distress HEENT: atraumatic, clear conjunctiva, anicteric sclera, moist mucus membranes, hearing grossly normal  Respiratory system: CTAB, no wheezes, rales or rhonchi, normal respiratory effort. Cardiovascular system: normal S1/S2, RRR, no JVD, murmurs, rubs, gallops, no pedal edema.   Gastrointestinal system: soft, NT, ND, no HSM felt, +bowel sounds. Central nervous system: A&O x4. no gross focal neurologic deficits, normal speech Extremities: moves all, no edema, normal tone Skin: dry, intact, normal temperature, normal color,No rashes, lesions or ulcers Psychiatry: normal mood, congruent affect, judgement and insight appear normal   Condition at discharge: stable  The results  of significant diagnostics from this hospitalization (including imaging, microbiology, ancillary and laboratory) are listed below for reference.   Imaging Studies: CT ABDOMEN PELVIS W CONTRAST  Result Date: 08/25/2021 CLINICAL DATA:  Vomiting for the past 5 days. EXAM: CT ABDOMEN AND PELVIS WITH CONTRAST TECHNIQUE: Multidetector CT imaging of the abdomen and pelvis was performed using the standard protocol following bolus administration of intravenous contrast. RADIATION DOSE REDUCTION: This exam was performed according to the departmental dose-optimization program which includes automated exposure control, adjustment of the mA and/or kV according to patient size and/or use of iterative reconstruction technique. CONTRAST:  OMNIPAQUE IOHEXOL 300 MG/ML  SOLN COMPARISON:  Right hip x-ray  report dated May 13, 2021. CT abdomen pelvis dated August 27, 2016. FINDINGS: Lower chest: No acute abnormality. Hepatobiliary: No focal liver abnormality. Multiple gallstones again noted. No gallbladder wall thickening or biliary dilatation. Pancreas: Fatty atrophy. No ductal dilatation or surrounding inflammatory changes. Spleen: Normal in size without focal abnormality. Adrenals/Urinary Tract: Adrenal glands are unremarkable. New patchy hypoenhancement of both kidneys with mild perinephric fat stranding, best appreciated on the delayed images. No renal calculi or hydronephrosis. The bladder is unremarkable. Stomach/Bowel: Stomach is within normal limits. Appendix appears normal. No evidence of bowel wall thickening, distention, or inflammatory changes. Mild left-sided colonic diverticulosis again noted. Vascular/Lymphatic: Slightly enlarged left upper retroperitoneal lymph node measuring 1.1 cm in short axis, likely reactive. No significant vascular findings. Reproductive: Uterus and bilateral adnexa are unremarkable. Other: No free fluid or pneumoperitoneum. Musculoskeletal: Subacute to chronic appearing right subcapital femoral neck fracture with prominent sclerosis, new since May by report. Incomplete healing anteriorly (series 5, image 53). No prior fixation. IMPRESSION: 1. Bilateral acute pyelonephritis. 2. Subacute to chronic appearing right subcapital femoral neck fracture with incomplete healing anteriorly and no prior fixation, new since May by report. This may be stress related in the absence of trauma. 3. Unchanged cholelithiasis. Electronically Signed   By: Obie Dredge M.D.   On: 08/25/2021 15:56    Microbiology: Results for orders placed or performed during the hospital encounter of 08/25/21  Urine Culture     Status: Abnormal   Collection Time: 08/25/21 12:39 PM   Specimen: Urine, Clean Catch  Result Value Ref Range Status   Specimen Description   Final    URINE, CLEAN CATCH Performed  at [facility], [address], [place], [place] [address]    Special Requests   Final    NONE Performed at [facility], [address], [place], [place] [address]    Culture 10,000 COLONIES/mL KLEBSIELLA OXYTOCA (A)  Final   Report Status 08/27/2021 FINAL  Final   Organism ID, Bacteria KLEBSIELLA OXYTOCA (A)  Final      Susceptibility   Klebsiella oxytoca - MIC*    AMPICILLIN RESISTANT Resistant     CEFAZOLIN 16 SENSITIVE Sensitive     CEFEPIME <=0.12 SENSITIVE Sensitive     CEFTRIAXONE <=0.25 SENSITIVE Sensitive     CIPROFLOXACIN <=0.25 SENSITIVE Sensitive     GENTAMICIN <=1 SENSITIVE Sensitive     IMIPENEM <=0.25 SENSITIVE Sensitive     NITROFURANTOIN <=16 SENSITIVE Sensitive     TRIMETH/SULFA <=20 SENSITIVE Sensitive     AMPICILLIN/SULBACTAM 4 SENSITIVE Sensitive     PIP/TAZO <=4 SENSITIVE Sensitive     * 10,000 COLONIES/mL KLEBSIELLA OXYTOCA    Labs: CBC: No results for input(s): "WBC", "NEUTROABS", "HGB", "HCT", "MCV", "PLT" in the last 168 hours. Basic Metabolic Panel: No results for input(s): "NA", "K", "CL", "  CO2", "GLUCOSE", "BUN", "CREATININE", "CALCIUM", "MG", "PHOS" in the last 168 hours. Liver Function Tests: No results for input(s): "AST", "ALT", "ALKPHOS", "BILITOT", "PROT", "ALBUMIN" in the last 168 hours. CBG: No results for input(s): "GLUCAP" in the last 168 hours.  Discharge time spent: less than 30 minutes.  Signed: Pennie Banter, DO [facility] 09/07/2021

## 2021-11-12 ENCOUNTER — Ambulatory Visit
Admission: RE | Admit: 2021-11-12 | Discharge: 2021-11-12 | Disposition: A | Discharge status: Home / Self Care | Payer: Medicare HMO | Source: Ambulatory Visit | Attending: Gastroenterology | Admitting: Gastroenterology

## 2021-11-12 ENCOUNTER — Encounter
Admission: RE | Disposition: A | Discharge status: Home / Self Care | Payer: Self-pay | Source: Ambulatory Visit | Attending: Gastroenterology

## 2021-11-12 ENCOUNTER — Encounter: Payer: Self-pay | Admitting: Anesthesiology

## 2021-11-12 SURGERY — COLONOSCOPY WITH PROPOFOL
Anesthesia: General

## 2021-11-12 MED ORDER — PROPOFOL 10 MG/ML IV BOLUS
INTRAVENOUS | Status: AC
  Filled 2021-11-12: qty 20

## 2021-11-12 MED ORDER — PROPOFOL 1000 MG/100ML IV EMUL
INTRAVENOUS | Status: AC
  Filled 2021-11-12: qty 100

## 2022-02-01 ENCOUNTER — Other Ambulatory Visit: Payer: Self-pay | Admitting: Internal Medicine

## 2022-02-01 DIAGNOSIS — Z1231 Encounter for screening mammogram for malignant neoplasm of breast: Secondary | ICD-10-CM

## 2022-02-07 ENCOUNTER — Ambulatory Visit: Admit: 2022-02-07 | Payer: Medicare HMO

## 2022-02-07 SURGERY — COLONOSCOPY WITH PROPOFOL
Anesthesia: General

## 2022-02-15 ENCOUNTER — Emergency Department: Payer: Medicare HMO

## 2022-02-15 ENCOUNTER — Emergency Department
Admission: EM | Admit: 2022-02-15 | Discharge: 2022-02-15 | Disposition: A | Discharge status: Home / Self Care | Payer: Medicare HMO | Attending: Emergency Medicine | Admitting: Emergency Medicine

## 2022-02-15 DIAGNOSIS — R102 Pelvic and perineal pain: Secondary | ICD-10-CM | POA: Diagnosis not present

## 2022-02-15 DIAGNOSIS — S32010A Wedge compression fracture of first lumbar vertebra, initial encounter for closed fracture: Secondary | ICD-10-CM

## 2022-02-15 DIAGNOSIS — Z8673 Personal history of transient ischemic attack (TIA), and cerebral infarction without residual deficits: Secondary | ICD-10-CM | POA: Diagnosis not present

## 2022-02-15 DIAGNOSIS — E119 Type 2 diabetes mellitus without complications: Secondary | ICD-10-CM | POA: Diagnosis not present

## 2022-02-15 DIAGNOSIS — W010XXA Fall on same level from slipping, tripping and stumbling without subsequent striking against object, initial encounter: Secondary | ICD-10-CM | POA: Insufficient documentation

## 2022-02-15 DIAGNOSIS — S32018A Other fracture of first lumbar vertebra, initial encounter for closed fracture: Secondary | ICD-10-CM | POA: Insufficient documentation

## 2022-02-15 DIAGNOSIS — Y92000 Kitchen of unspecified non-institutional (private) residence as  the place of occurrence of the external cause: Secondary | ICD-10-CM | POA: Insufficient documentation

## 2022-02-15 DIAGNOSIS — I10 Essential (primary) hypertension: Secondary | ICD-10-CM | POA: Diagnosis not present

## 2022-02-15 DIAGNOSIS — S79911A Unspecified injury of right hip, initial encounter: Secondary | ICD-10-CM | POA: Diagnosis not present

## 2022-02-15 DIAGNOSIS — M545 Low back pain, unspecified: Secondary | ICD-10-CM | POA: Diagnosis not present

## 2022-02-15 DIAGNOSIS — M1611 Unilateral primary osteoarthritis, right hip: Secondary | ICD-10-CM | POA: Diagnosis not present

## 2022-02-15 DIAGNOSIS — M5136 Other intervertebral disc degeneration, lumbar region: Secondary | ICD-10-CM | POA: Diagnosis not present

## 2022-02-15 MED ORDER — OXYCODONE-ACETAMINOPHEN 5-325 MG PO TABS
1.0000 | ORAL_TABLET | Freq: Once | ORAL | Status: AC
  Administered 2022-02-15: 1 via ORAL
  Filled 2022-02-15: qty 1

## 2022-02-15 MED ORDER — OXYCODONE HCL 5 MG PO TABS: 5.0000 mg | ORAL_TABLET | Freq: Three times a day (TID) | ORAL | 0 refills | Status: AC | PRN

## 2022-02-15 NOTE — ED Notes (Signed)
Called [facility] (gerrad) and ordered TLSO brace

## 2022-02-15 NOTE — Discharge Instructions (Addendum)
You have a compression fracture of your first lumbar vertebrae.  Please wear the back brace when you are up and walking around.  Take Tylenol 1 g every 8 hours and 400 mg of ibuprofen every 6-8 hours.  You can use the oxycodone for intractable pain.  Follow-up with your primary care doctor and neurosurgery.

## 2022-02-15 NOTE — ED Notes (Signed)
Pt sleeping at this time, respirations even and nonlabored. Rate WNL

## 2022-02-15 NOTE — ED Notes (Signed)
Pt states pain is under control at this time. Waiting on TLSO brace still. Pt has call light if she needs anything.

## 2022-02-15 NOTE — ED Triage Notes (Signed)
Pt sts that she fell today and has been having lower back pain with pelvic pain.

## 2022-02-15 NOTE — Progress Notes (Signed)
Orthopedic Tech Progress Note Patient Details:  Lacey Gonzalez 17-Mar-1955 [id number]  Ortho Devices Type of Ortho Device: Thoracolumbar corset (TLSO) Ortho Device/Splint Location: Back Ortho Device/Splint Interventions: Ordered      Edwina Barth 02/15/2022, 10:08 PM

## 2022-02-15 NOTE — ED Provider Notes (Signed)
[facility] Provider Note    Event Date/Time   First MD Initiated Contact with Patient 02/15/22 1918     (approximate)   History   Fall   HPI  Lacey Gonzalez is a 67 y.o. female  with pmh CVA w/ residual R sided weakness, DM, N/V who p/w pelvic and low back pain after a fall. Patient was in the kitchen when she lost balance and fell onto her buttocks.  Denies hitting her head and denies loss of consciousness.  Has been able to ambulate since but is having pain in her low back and right pelvic region.  Denies neck pain numbness tingling weakness in her legs or arms.  Denies headache nausea vomiting.  She is not on blood thinners.     Past Medical History:  Diagnosis Date   Anxiety    DDD (degenerative disc disease), lumbar    Depression    Diabetes mellitus without complication ([place])    Gallstones    High anion gap metabolic acidosis 0000000   Hypertension    Nausea and vomiting 08/25/2021   Stroke [facility])     Patient Active Problem List   Diagnosis Date Noted   Acute pyelonephritis 08/25/2021   Cholelithiasis 08/25/2021   Malnutrition of moderate degree 09/21/2020   CVA (cerebral vascular accident) ([place]) 09/20/2020   Acute lower UTI 09/20/2020   Anxiety and depression 07/03/2020   Primary hypertension 07/03/2020   DM2 (diabetes mellitus, type 2) ([place]) 04/13/2017     Physical Exam  Triage Vital Signs: ED Triage Vitals  Enc Vitals Group     BP 02/15/22 1902 (!) 167/66     Pulse Rate 02/15/22 1902 90     Resp 02/15/22 1902 18     Temp 02/15/22 1902 (!) 97.5 F (36.4 C)     Temp Source 02/15/22 1902 Oral     SpO2 02/15/22 1902 100 %     Weight 02/15/22 1901 140 lb (63.5 kg)     Height --      Head Circumference --      Peak Flow --      Pain Score 02/15/22 1901 7     Pain Loc --      Pain Edu? --      Excl. in [place]? --     Most recent vital signs: Vitals:   02/15/22 1902 02/15/22 2216  BP: (!) 167/66 (!) 148/55  Pulse: 90 99   Resp: 18 18  Temp: (!) 97.5 F (36.4 C)   SpO2: 100% 97%     General: Awake, no distress.  CV:  Good peripheral perfusion.  Resp:  Normal effort.  Abd:  No distention.  Neuro:             Awake, Alert, Oriented x 3  Other:  No midline C, T or L-spine tenderness, mild tenderness over the right SI joint, anterior pelvis stable nontender Patient able to range both hips without difficulty, 5 out of 5 strength plantarflexion on the left, 4-5 in the right  ED Results / Procedures / Treatments  Labs (all labs ordered are listed, but only abnormal results are displayed) Labs Reviewed - No data to display   EKG     RADIOLOGY Reviewed interpreted the CT of the L-spine which shows L1 compression fracture acute   PROCEDURES:  Critical Care performed: No  Procedures   MEDICATIONS ORDERED IN ED: Medications  oxyCODONE-acetaminophen (PERCOCET/ROXICET) 5-325 MG per tablet 1 tablet (1 tablet Oral  Given 02/15/22 2005)  oxyCODONE-acetaminophen (PERCOCET/ROXICET) 5-325 MG per tablet 1 tablet (1 tablet Oral Given 02/15/22 2215)     IMPRESSION / MDM / ASSESSMENT AND PLAN / ED COURSE  I reviewed the triage vital signs and the nursing notes.                              Patient's presentation is most consistent with acute complicated illness / injury requiring diagnostic workup.  Differential diagnosis includes, but is not limited to, pelvic fracture, hip fracture, lumbar compression fracture, contusion, musculoskeletal pain   The patient is a 67 year old female presents after a fall with back and pelvic pain.  Patient fell in kitchen today lost balance falling onto her butt.  She has since had low back and right hip pain has been able to ambulate with difficulty denies any numbness tingling weakness in her legs denies head strike headache vomiting.  On exam she does have pain with palpation of the right SI joint, no significant lumbar tenderness to palpation.  She is able to range both  hips pain definitely seems to localize to the right hip.  She has decreased active plantarflexion dorsiflexion on the right but this is chronic for her normal strength on the left.  Sensation intact in lower extremities.  Plan to start with x-rays of the right hip pelvis and lumbar spine.  May need CT if these are not revealing.  X-ray of the right hip shows a probable subcapital impacted fracture.  Will obtain a CT of the hip to better characterize.  L-spine x-ray shows probable L1 compression fracture with about 40% height loss.  Will also obtain a CT of the lumbar spine.    CT of the hip is negative for fx. XT L spine confirms L spine compression fx, mild retrolisthesis. D/w Dr. Cari Caraway who recommends TLSO and is okay with patient dc. On reassessment patients pain is better controlled, she was able to ambulate with me. I think she will be appropriate for outpatient treatment.    FINAL CLINICAL IMPRESSION(S) / ED DIAGNOSES   Final diagnoses:  Compression fracture of L1 vertebra, initial encounter ([place])     Rx / DC Orders   ED Discharge Orders          Ordered    oxyCODONE (ROXICODONE) 5 MG immediate release tablet  Every 8 hours PRN        02/15/22 2218             Note:  This document was prepared using Dragon voice recognition software and may include unintentional dictation errors.   Rada Hay, MD 02/15/22 [phone]

## 2022-02-23 DIAGNOSIS — Z23 Encounter for immunization: Secondary | ICD-10-CM | POA: Diagnosis not present

## 2022-02-23 DIAGNOSIS — F330 Major depressive disorder, recurrent, mild: Secondary | ICD-10-CM | POA: Diagnosis not present

## 2022-02-23 DIAGNOSIS — Z8673 Personal history of transient ischemic attack (TIA), and cerebral infarction without residual deficits: Secondary | ICD-10-CM | POA: Diagnosis not present

## 2022-02-23 DIAGNOSIS — E1169 Type 2 diabetes mellitus with other specified complication: Secondary | ICD-10-CM | POA: Diagnosis not present

## 2022-02-23 DIAGNOSIS — I10 Essential (primary) hypertension: Secondary | ICD-10-CM | POA: Diagnosis not present

## 2022-02-23 DIAGNOSIS — Z794 Long term (current) use of insulin: Secondary | ICD-10-CM | POA: Diagnosis not present

## 2022-02-23 DIAGNOSIS — E785 Hyperlipidemia, unspecified: Secondary | ICD-10-CM | POA: Diagnosis not present

## 2022-02-23 DIAGNOSIS — M4856XD Collapsed vertebra, not elsewhere classified, lumbar region, subsequent encounter for fracture with routine healing: Secondary | ICD-10-CM | POA: Diagnosis not present

## 2022-02-23 DIAGNOSIS — E118 Type 2 diabetes mellitus with unspecified complications: Secondary | ICD-10-CM | POA: Diagnosis not present

## 2022-02-24 NOTE — Progress Notes (Addendum)
Referring Physician:  Georga Hacking, MD [address] [place],  [place] [address]  Primary Physician:  Gracelyn Nurse, MD  History of Present Illness: 02/28/2022 Lacey Gonzalez has a history of CVA with right sided weakness, DM, HTN.   She was seen in ED on 02/15/22 with low back pain after a fall. CT showed L1 compression fracture. ED reviewed with Dr. Myer Haff and he recommended TLSO with out patient follow up.   She has constant LBP that is more on right side. No leg pain. She has chronic weakness in right since CVA. No numbness or tingling. Pain is worse with standing and walking. Some relief with sitting.   Given oxycodone from ED. Some relief with lidoderm patches. PCP recently sent in hydrocodone 10 for her.   Bowel/Bladder Dysfunction: none  Conservative measures:  Physical therapy: none Multimodal medical therapy including regular antiinflammatories: oxycodone  Injections: No epidural steroid injections  Past Surgery: none  Lacey Gonzalez has chronic balance and dexterity issues since her stroke.   The symptoms are causing a significant impact on the patient's life.   Review of Systems:  A 10 point review of systems is negative, except for the pertinent positives and negatives detailed in the HPI.  Past Medical History: Past Medical History:  Diagnosis Date   Anxiety    DDD (degenerative disc disease), lumbar    Depression    Diabetes mellitus without complication (HCC)    Gallstones    High anion gap metabolic acidosis 08/26/2021   Hypertension    Nausea and vomiting 08/25/2021   Stroke [facility])     Past Surgical History: No past surgical history on file.  Allergies: Allergies as of 02/28/2022   (No Known Allergies)    Medications: Outpatient Encounter Medications as of 02/28/2022  Medication Sig   acetaminophen (TYLENOL) 500 MG tablet Take 500 mg by mouth in the morning and at bedtime.   amLODipine (NORVASC) 10 MG tablet Take 1 tablet (10 mg  total) by mouth daily.   aspirin EC 81 MG EC tablet Take 1 tablet (81 mg total) by mouth daily. Swallow whole.   atorvastatin (LIPITOR) 40 MG tablet Take 1 tablet (40 mg total) by mouth daily.   busPIRone (BUSPAR) 5 MG tablet Take 5 mg by mouth 2 (two) times daily.   DULoxetine (CYMBALTA) 30 MG capsule Take 30 mg by mouth daily.   escitalopram (LEXAPRO) 10 MG tablet Take 10 mg by mouth daily.   gabapentin (NEURONTIN) 300 MG capsule Take 300 mg by mouth daily.   JANUVIA 100 MG tablet Take 100 mg by mouth daily.   meloxicam (MOBIC) 15 MG tablet Take 15 mg by mouth daily.   Multiple Vitamin (MULTI-VITAMIN) tablet Take 1 tablet by mouth daily.   olmesartan (BENICAR) 20 MG tablet Take 20 mg by mouth daily.   ondansetron (ZOFRAN) 8 MG tablet Take 8 mg by mouth 3 (three) times daily.   oxybutynin (DITROPAN-XL) 5 MG 24 hr tablet Take 5 mg by mouth daily.   polyethylene glycol powder (GLYCOLAX/MIRALAX) 17 GM/SCOOP powder Take 17 g by mouth daily.   senna (SENOKOT) 8.6 MG tablet    insulin glargine (LANTUS) 100 UNIT/ML injection Inject 40 Units into the skin at bedtime.   metoCLOPramide (REGLAN) 10 MG tablet Take 1 tablet (10 mg total) by mouth every 8 (eight) hours as needed for nausea or vomiting.   [DISCONTINUED] ondansetron (ZOFRAN) 4 MG tablet Take 1 tablet (4 mg total) by mouth daily  as needed for nausea or vomiting.   No facility-administered encounter medications on file as of 02/28/2022.    Social History: Social History   Tobacco Use   Smoking status: Never   Smokeless tobacco: Never  Substance Use Topics   Alcohol use: Yes    Comment: occasional   Drug use: No    Family Medical History: No family history on file.  Physical Examination: Vitals:   02/28/22 1119  BP: (!) 150/72    General: Patient is well developed, well nourished, calm, collected, and in no apparent distress. Attention to examination is appropriate.  Respiratory: Patient is breathing without any  difficulty.   NEUROLOGICAL:     Awake, alert, oriented to person, place, and time.  Speech is clear and fluent. Fund of knowledge is appropriate.   Cranial Nerves: Pupils equal round and reactive to light.  Facial tone is symmetric.    ROM of lumbar spine not tested.   She has mild tenderness on right near TL junction.   No abnormal lesions on exposed skin.   Strength: Side Biceps Triceps Deltoid Interossei Grip Wrist Ext. Wrist Flex.  R 5 4+ 4 4 5  4+ 4+  L 5 5 5 5 5 5 5    Side Iliopsoas Quads Hamstring PF DF EHL  R 5 5 5 5 5 5   L 5 5 5 5 5 5    Reflexes are 2+ at the left biceps, left triceps, bilateral brachioradialis,  bilateral patella and bilateral achilles.     Reflexes are 3+ at right biceps, triceps.   Hoffman's is positive on right, negative on left.   Bilateral upper and lower extremity sensation is intact to light touch.     Gait is normal.    Medical Decision Making  Imaging: CT lumbar spine dated 02/15/22:  FINDINGS: Segmentation: 5 lumbar type vertebral bodies.   Alignment: Normal alignment of the lumbar spine.   Vertebrae: Acute appearing comminuted compression fracture of the L1 vertebra with about 40% loss of height. Fracture lines extend from the anterior to the posterior vertebral surface. There is minimal retrolisthesis of fracture fragment posteriorly. Fractures involve the base of the right pedicle. Posterior elements are otherwise intact. No other acute fractures are demonstrated.   Paraspinal and other soft tissues: No abnormal paraspinal soft tissue mass or infiltration. Visualized retroperitoneal structures appear grossly intact.   Disc levels: Mild degenerative changes with disc space narrowing and small osteophyte formation throughout the lumbar spine. Degenerative changes in the posterior facet joints.   IMPRESSION: Acute comminuted compression fracture of the L1 vertebra with mild retrolisthesis of fracture fragments. Fracture  involves the base of the right pedicle. Normal alignment.     Electronically Signed   By: Burman Nieves M.D.   On: 02/15/2022 21:10  I have personally reviewed the images and agree with the above interpretation.  Assessment and Plan: Lacey Gonzalez is a pleasant 67 y.o. female has L1 compression fracture s/p fall on 02/15/22. She has right sided LBP with no leg pain.   She has chronic right sided weakness and balance issues since CVA. Has dexterity issues due to weakness in right hand.   Treatment options discussed with patient and following plan made:   - Xrays of lumbar spine on her way out. Will call her with results.  - Continue with TLSO brace. Do not wear to sleep. Can remove if watching TV. - No bending, twisting, or lifting.  - Continue on hydrocodone 10 from PCP (given #30 on  02/23/22 and still has a lot). PMP reviewed and is appropriate.  - Follow up in 6 weeks with repeat lumbar xrays.   Of note, she has has chronic right sided weakness and balance issues since CVA. Has dexterity issues due to weakness in right hand.  She has some hyper reflexia and positive hoffmans on right. This was reviewed with Dr. Myer Haff after her visit and is likely due to her CVA. Symptoms appear stable so will follow.   BP was elevated, it was 150/72. She says it typically runs this high. No symptoms of chest pain, shortness of breath, blurry vision, or headaches. She has not taken her blood pressure medications yet this morning.   She does not have BP cuff at home. She will take her medications when she gets home and call her PCP with any issues. If she develops CP, SOB, blurry vision, or headaches, then she will go to ED.    I spent a total of 40 minutes in face-to-face and non-face-to-face activities related to this patient's care today including review of outside records, review of imaging, review of symptoms, physical exam, discussion of differential diagnosis, discussion of treatment options, and  documentation.   Thank you for involving me in the care of this patient.   Drake Leach PA-C Dept. of Neurosurgery

## 2022-02-28 ENCOUNTER — Ambulatory Visit: Payer: Medicare HMO | Admitting: Orthopedic Surgery

## 2022-02-28 ENCOUNTER — Ambulatory Visit
Admission: RE | Admit: 2022-02-28 | Discharge: 2022-02-28 | Disposition: A | Discharge status: Home / Self Care | Payer: Medicare HMO | Attending: Orthopedic Surgery | Admitting: Orthopedic Surgery

## 2022-02-28 ENCOUNTER — Ambulatory Visit
Admission: RE | Admit: 2022-02-28 | Discharge: 2022-02-28 | Disposition: A | Discharge status: Home / Self Care | Payer: Medicare HMO | Source: Ambulatory Visit | Attending: Orthopedic Surgery | Admitting: Orthopedic Surgery

## 2022-02-28 ENCOUNTER — Encounter: Payer: Self-pay | Admitting: Orthopedic Surgery

## 2022-02-28 VITALS — BP 150/72 | Ht 62.0 in | Wt 131.4 lb

## 2022-02-28 DIAGNOSIS — W19XXXA Unspecified fall, initial encounter: Secondary | ICD-10-CM

## 2022-02-28 DIAGNOSIS — S32010A Wedge compression fracture of first lumbar vertebra, initial encounter for closed fracture: Secondary | ICD-10-CM

## 2022-02-28 DIAGNOSIS — M545 Low back pain, unspecified: Secondary | ICD-10-CM | POA: Diagnosis not present

## 2022-02-28 DIAGNOSIS — M47816 Spondylosis without myelopathy or radiculopathy, lumbar region: Secondary | ICD-10-CM | POA: Diagnosis not present

## 2022-02-28 NOTE — Patient Instructions (Signed)
It was so nice to see you today. Thank you so much for coming in.    You have a broken bone/compression fracture in your lower back at L1. This is likely what is causing your back pain.   I ordered xrays of your lower back for you to get today. You can go across the street to get these at [place] (building with the white pillars). You do not need any appointment. I will call you with results.   Continue to wear your brace. Do not wear to sleep. Can remove if you are sitting and watching TV.  No bending, twisting, or lifting.   Continue on hydrocodone as directed for severe pain. Remember, this can make you sleepy and/or constipated.   Take you blood pressure medications when you get home. If you have any chest pain, shortness of breath, blurry vision, or headaches then you need to go to ED. Call your PCP with any concerns.   I want to see you back in 4-6 weeks. You will need to come 30-45 minutes early to get xrays- come at 12:45 and go to [place] (building with the white pillars), then come to see me.   Please do not hesitate to call if you have any questions or concerns. You can also message me in [place].   Geronimo Boot PA-C [phone]

## 2022-03-07 ENCOUNTER — Other Ambulatory Visit: Payer: Self-pay

## 2022-03-07 ENCOUNTER — Encounter: Payer: Self-pay | Admitting: Internal Medicine

## 2022-03-07 ENCOUNTER — Emergency Department: Payer: Medicare HMO

## 2022-03-07 ENCOUNTER — Inpatient Hospital Stay
Admission: EM | Admit: 2022-03-07 | Discharge: 2022-03-09 | DRG: 638 | Disposition: A | Discharge status: Home / Self Care | Payer: Medicare HMO | Attending: Internal Medicine | Admitting: Internal Medicine

## 2022-03-07 DIAGNOSIS — N390 Urinary tract infection, site not specified: Secondary | ICD-10-CM | POA: Diagnosis present

## 2022-03-07 DIAGNOSIS — Z1152 Encounter for screening for COVID-19: Secondary | ICD-10-CM | POA: Diagnosis not present

## 2022-03-07 DIAGNOSIS — S32010A Wedge compression fracture of first lumbar vertebra, initial encounter for closed fracture: Secondary | ICD-10-CM | POA: Insufficient documentation

## 2022-03-07 DIAGNOSIS — M4856XA Collapsed vertebra, not elsewhere classified, lumbar region, initial encounter for fracture: Secondary | ICD-10-CM | POA: Diagnosis not present

## 2022-03-07 DIAGNOSIS — E1140 Type 2 diabetes mellitus with diabetic neuropathy, unspecified: Secondary | ICD-10-CM | POA: Diagnosis present

## 2022-03-07 DIAGNOSIS — Z791 Long term (current) use of non-steroidal anti-inflammatories (NSAID): Secondary | ICD-10-CM | POA: Diagnosis not present

## 2022-03-07 DIAGNOSIS — Z1611 Resistance to penicillins: Secondary | ICD-10-CM | POA: Diagnosis present

## 2022-03-07 DIAGNOSIS — F419 Anxiety disorder, unspecified: Secondary | ICD-10-CM | POA: Diagnosis present

## 2022-03-07 DIAGNOSIS — R531 Weakness: Secondary | ICD-10-CM | POA: Diagnosis not present

## 2022-03-07 DIAGNOSIS — E785 Hyperlipidemia, unspecified: Secondary | ICD-10-CM | POA: Diagnosis not present

## 2022-03-07 DIAGNOSIS — Z7984 Long term (current) use of oral hypoglycemic drugs: Secondary | ICD-10-CM

## 2022-03-07 DIAGNOSIS — E11649 Type 2 diabetes mellitus with hypoglycemia without coma: Principal | ICD-10-CM | POA: Diagnosis present

## 2022-03-07 DIAGNOSIS — Z8249 Family history of ischemic heart disease and other diseases of the circulatory system: Secondary | ICD-10-CM

## 2022-03-07 DIAGNOSIS — I69351 Hemiplegia and hemiparesis following cerebral infarction affecting right dominant side: Secondary | ICD-10-CM

## 2022-03-07 DIAGNOSIS — R410 Disorientation, unspecified: Secondary | ICD-10-CM | POA: Diagnosis not present

## 2022-03-07 DIAGNOSIS — R680 Hypothermia, not associated with low environmental temperature: Secondary | ICD-10-CM | POA: Diagnosis present

## 2022-03-07 DIAGNOSIS — Z79899 Other long term (current) drug therapy: Secondary | ICD-10-CM | POA: Diagnosis not present

## 2022-03-07 DIAGNOSIS — I639 Cerebral infarction, unspecified: Secondary | ICD-10-CM | POA: Diagnosis present

## 2022-03-07 DIAGNOSIS — Z7982 Long term (current) use of aspirin: Secondary | ICD-10-CM | POA: Diagnosis not present

## 2022-03-07 DIAGNOSIS — F32A Depression, unspecified: Secondary | ICD-10-CM | POA: Diagnosis present

## 2022-03-07 DIAGNOSIS — R4182 Altered mental status, unspecified: Secondary | ICD-10-CM | POA: Diagnosis present

## 2022-03-07 DIAGNOSIS — E440 Moderate protein-calorie malnutrition: Secondary | ICD-10-CM | POA: Diagnosis not present

## 2022-03-07 DIAGNOSIS — B961 Klebsiella pneumoniae [K. pneumoniae] as the cause of diseases classified elsewhere: Secondary | ICD-10-CM | POA: Diagnosis not present

## 2022-03-07 DIAGNOSIS — E162 Hypoglycemia, unspecified: Secondary | ICD-10-CM

## 2022-03-07 DIAGNOSIS — E875 Hyperkalemia: Secondary | ICD-10-CM | POA: Diagnosis not present

## 2022-03-07 DIAGNOSIS — Z136 Encounter for screening for cardiovascular disorders: Secondary | ICD-10-CM | POA: Diagnosis not present

## 2022-03-07 DIAGNOSIS — N3000 Acute cystitis without hematuria: Principal | ICD-10-CM

## 2022-03-07 DIAGNOSIS — Z6823 Body mass index (BMI) 23.0-23.9, adult: Secondary | ICD-10-CM | POA: Diagnosis not present

## 2022-03-07 DIAGNOSIS — Z794 Long term (current) use of insulin: Secondary | ICD-10-CM

## 2022-03-07 DIAGNOSIS — I10 Essential (primary) hypertension: Secondary | ICD-10-CM | POA: Diagnosis present

## 2022-03-07 DIAGNOSIS — R54 Age-related physical debility: Secondary | ICD-10-CM | POA: Diagnosis not present

## 2022-03-07 DIAGNOSIS — T68XXXA Hypothermia, initial encounter: Secondary | ICD-10-CM

## 2022-03-07 DIAGNOSIS — R059 Cough, unspecified: Secondary | ICD-10-CM | POA: Diagnosis not present

## 2022-03-07 DIAGNOSIS — E119 Type 2 diabetes mellitus without complications: Secondary | ICD-10-CM

## 2022-03-07 DIAGNOSIS — R634 Abnormal weight loss: Secondary | ICD-10-CM | POA: Insufficient documentation

## 2022-03-07 DIAGNOSIS — R231 Pallor: Secondary | ICD-10-CM | POA: Diagnosis not present

## 2022-03-07 LAB — COMPREHENSIVE METABOLIC PANEL
ALT: 26 U/L (ref 0–44)
AST: 32 U/L (ref 15–41)
Albumin: 3.1 g/dL — ABNORMAL LOW (ref 3.5–5.0)
Alkaline Phosphatase: 119 U/L (ref 38–126)
Anion gap: 7 (ref 5–15)
BUN: 22 mg/dL (ref 8–23)
CO2: 17 mmol/L — ABNORMAL LOW (ref 22–32)
Calcium: 7.9 mg/dL — ABNORMAL LOW (ref 8.9–10.3)
Chloride: 113 mmol/L — ABNORMAL HIGH (ref 98–111)
Creatinine, Ser: 1.09 mg/dL — ABNORMAL HIGH (ref 0.44–1.00)
GFR, Estimated: 56 mL/min — ABNORMAL LOW (ref >60)
Glucose, Bld: 88 mg/dL (ref 70–99)
Potassium: 3.9 mmol/L (ref 3.5–5.1)
Sodium: 137 mmol/L (ref 135–145)
Total Bilirubin: 0.3 mg/dL (ref 0.3–1.2)
Total Protein: 6.3 g/dL — ABNORMAL LOW (ref 6.5–8.1)

## 2022-03-07 LAB — CBG MONITORING, ED
Glucose-Capillary: 88 mg/dL (ref 70–99)
Glucose-Capillary: 80 mg/dL (ref 70–99)
Glucose-Capillary: 154 mg/dL — ABNORMAL HIGH (ref 70–99)
Glucose-Capillary: 39 mg/dL — CL (ref 70–99)
Glucose-Capillary: 43 mg/dL — CL (ref 70–99)
Glucose-Capillary: 131 mg/dL — ABNORMAL HIGH (ref 70–99)

## 2022-03-07 LAB — CBC WITH DIFFERENTIAL/PLATELET
Abs Immature Granulocytes: 0.02 10*3/uL (ref 0.00–0.07)
Basophils Absolute: 0 10*3/uL (ref 0.0–0.1)
Basophils Relative: 1 %
Eosinophils Absolute: 0.1 10*3/uL (ref 0.0–0.5)
Eosinophils Relative: 1 %
HCT: 37.6 % (ref 36.0–46.0)
Hemoglobin: 11.4 g/dL — ABNORMAL LOW (ref 12.0–15.0)
Immature Granulocytes: 0 %
Lymphocytes Relative: 19 %
Lymphs Abs: 1.4 10*3/uL (ref 0.7–4.0)
MCH: 26.5 pg (ref 26.0–34.0)
MCHC: 30.3 g/dL (ref 30.0–36.0)
MCV: 87.4 fL (ref 80.0–100.0)
Monocytes Absolute: 0.3 10*3/uL (ref 0.1–1.0)
Monocytes Relative: 4 %
Neutro Abs: 5.6 10*3/uL (ref 1.7–7.7)
Neutrophils Relative %: 75 %
Platelets: 245 10*3/uL (ref 150–400)
RBC: 4.3 MIL/uL (ref 3.87–5.11)
RDW: 16.9 % — ABNORMAL HIGH (ref 11.5–15.5)
WBC: 7.5 10*3/uL (ref 4.0–10.5)
nRBC: 0 % (ref 0.0–0.2)

## 2022-03-07 LAB — URINALYSIS, ROUTINE W REFLEX MICROSCOPIC
Bilirubin Urine: NEGATIVE
Glucose, UA: >=500 mg/dL — AB
Ketones, ur: NEGATIVE mg/dL
Nitrite: POSITIVE — AB
Protein, ur: NEGATIVE mg/dL
Specific Gravity, Urine: 1.001 — ABNORMAL LOW (ref 1.005–1.030)
Squamous Epithelial / HPF: NONE SEEN /HPF (ref 0–5)
pH: 6 (ref 5.0–8.0)

## 2022-03-07 LAB — LACTIC ACID, PLASMA
Lactic Acid, Venous: 1.5 mmol/L (ref 0.5–1.9)
Lactic Acid, Venous: 2.6 mmol/L (ref 0.5–1.9)

## 2022-03-07 LAB — RESP PANEL BY RT-PCR (RSV, FLU A&B, COVID)  RVPGX2
Influenza A by PCR: NEGATIVE
Influenza B by PCR: NEGATIVE
Resp Syncytial Virus by PCR: NEGATIVE
SARS Coronavirus 2 by RT PCR: NEGATIVE

## 2022-03-07 LAB — MAGNESIUM: Magnesium: 1.5 mg/dL — ABNORMAL LOW (ref 1.7–2.4)

## 2022-03-07 LAB — GLUCOSE, CAPILLARY
Glucose-Capillary: 141 mg/dL — ABNORMAL HIGH (ref 70–99)
Glucose-Capillary: 152 mg/dL — ABNORMAL HIGH (ref 70–99)
Glucose-Capillary: 100 mg/dL — ABNORMAL HIGH (ref 70–99)

## 2022-03-07 LAB — PROCALCITONIN: Procalcitonin: <0.1 ng/mL

## 2022-03-07 LAB — LIPASE, BLOOD: Lipase: 23 U/L (ref 11–51)

## 2022-03-07 MED ORDER — ACETAMINOPHEN 325 MG PO TABS
650.0000 mg | ORAL_TABLET | Freq: Four times a day (QID) | ORAL | Status: DC | PRN
  Administered 2022-03-09: 650 mg via ORAL
  Filled 2022-03-07: qty 2

## 2022-03-07 MED ORDER — SODIUM CHLORIDE 0.9 % IV SOLN
2.0000 g | Freq: Once | INTRAVENOUS | Status: AC
  Administered 2022-03-07: 2 g via INTRAVENOUS
  Filled 2022-03-07: qty 20

## 2022-03-07 MED ORDER — OXYBUTYNIN CHLORIDE ER 5 MG PO TB24
5.0000 mg | ORAL_TABLET | Freq: Every day | ORAL | Status: DC
  Administered 2022-03-07 – 2022-03-09 (×3): 5 mg via ORAL
  Filled 2022-03-07 (×3): qty 1

## 2022-03-07 MED ORDER — AMLODIPINE BESYLATE 10 MG PO TABS
10.0000 mg | ORAL_TABLET | Freq: Every day | ORAL | Status: DC
  Administered 2022-03-07 – 2022-03-09 (×3): 10 mg via ORAL
  Filled 2022-03-07 (×3): qty 1

## 2022-03-07 MED ORDER — GABAPENTIN 300 MG PO CAPS
300.0000 mg | ORAL_CAPSULE | Freq: Every day | ORAL | Status: DC
  Administered 2022-03-08 – 2022-03-09 (×2): 300 mg via ORAL
  Filled 2022-03-07 (×2): qty 1

## 2022-03-07 MED ORDER — ONDANSETRON HCL 4 MG/2ML IJ SOLN: 4.0000 mg | Freq: Four times a day (QID) | INTRAMUSCULAR | Status: DC | PRN

## 2022-03-07 MED ORDER — LACTATED RINGERS IV BOLUS
1000.0000 mL | Freq: Once | INTRAVENOUS | Status: AC
  Administered 2022-03-07: 1000 mL via INTRAVENOUS

## 2022-03-07 MED ORDER — MELOXICAM 7.5 MG PO TABS
15.0000 mg | ORAL_TABLET | Freq: Every day | ORAL | Status: DC
  Administered 2022-03-07 – 2022-03-09 (×3): 15 mg via ORAL
  Filled 2022-03-07 (×3): qty 2

## 2022-03-07 MED ORDER — ACETAMINOPHEN 650 MG RE SUPP: 650.0000 mg | Freq: Four times a day (QID) | RECTAL | Status: DC | PRN

## 2022-03-07 MED ORDER — OXYCODONE HCL 5 MG PO TABS
5.0000 mg | ORAL_TABLET | Freq: Four times a day (QID) | ORAL | Status: AC | PRN
  Administered 2022-03-07: 5 mg via ORAL
  Filled 2022-03-07 (×2): qty 1

## 2022-03-07 MED ORDER — PANTOPRAZOLE SODIUM 40 MG PO TBEC
40.0000 mg | DELAYED_RELEASE_TABLET | Freq: Every day | ORAL | Status: DC
  Administered 2022-03-07 – 2022-03-09 (×3): 40 mg via ORAL
  Filled 2022-03-07 (×3): qty 1

## 2022-03-07 MED ORDER — INSULIN ASPART 100 UNIT/ML IJ SOLN
0.0000 [IU] | Freq: Three times a day (TID) | INTRAMUSCULAR | Status: DC
  Administered 2022-03-08 (×2): 2 [IU] via SUBCUTANEOUS
  Administered 2022-03-09: 1 [IU] via SUBCUTANEOUS
  Administered 2022-03-09 (×2): 3 [IU] via SUBCUTANEOUS
  Filled 2022-03-07 (×5): qty 1

## 2022-03-07 MED ORDER — ASPIRIN 81 MG PO TBEC
81.0000 mg | DELAYED_RELEASE_TABLET | Freq: Every day | ORAL | Status: DC
  Administered 2022-03-07 – 2022-03-09 (×3): 81 mg via ORAL
  Filled 2022-03-07 (×3): qty 1

## 2022-03-07 MED ORDER — DEXTROSE 50 % IV SOLN
1.0000 | Freq: Once | INTRAVENOUS | Status: AC
  Administered 2022-03-07: 50 mL via INTRAVENOUS
  Filled 2022-03-07: qty 50

## 2022-03-07 MED ORDER — ESCITALOPRAM OXALATE 10 MG PO TABS
10.0000 mg | ORAL_TABLET | Freq: Every day | ORAL | Status: DC
  Administered 2022-03-08 – 2022-03-09 (×2): 10 mg via ORAL
  Filled 2022-03-07 (×2): qty 1

## 2022-03-07 MED ORDER — ONDANSETRON HCL 4 MG PO TABS: 4.0000 mg | ORAL_TABLET | Freq: Four times a day (QID) | ORAL | Status: DC | PRN

## 2022-03-07 MED ORDER — SODIUM CHLORIDE 0.9 % IV SOLN
1.0000 g | INTRAVENOUS | Status: DC
  Administered 2022-03-08 – 2022-03-09 (×2): 1 g via INTRAVENOUS
  Filled 2022-03-07 (×2): qty 10

## 2022-03-07 MED ORDER — DEXTROSE 50 % IV SOLN
25.0000 mL | INTRAVENOUS | Status: AC | PRN
  Administered 2022-03-08: 25 mL via INTRAVENOUS
  Filled 2022-03-07: qty 50

## 2022-03-07 MED ORDER — HYDRALAZINE HCL 20 MG/ML IJ SOLN: 5.0000 mg | Freq: Three times a day (TID) | INTRAMUSCULAR | Status: DC | PRN

## 2022-03-07 MED ORDER — BUSPIRONE HCL 10 MG PO TABS
5.0000 mg | ORAL_TABLET | Freq: Two times a day (BID) | ORAL | Status: DC
  Administered 2022-03-07 – 2022-03-08 (×2): 5 mg via ORAL
  Filled 2022-03-07 (×2): qty 1

## 2022-03-07 MED ORDER — INSULIN ASPART 100 UNIT/ML IJ SOLN
0.0000 [IU] | Freq: Every day | INTRAMUSCULAR | Status: DC
  Administered 2022-03-08: 2 [IU] via SUBCUTANEOUS
  Filled 2022-03-07: qty 1

## 2022-03-07 MED ORDER — SENNOSIDES-DOCUSATE SODIUM 8.6-50 MG PO TABS: 1.0000 | ORAL_TABLET | Freq: Every evening | ORAL | Status: DC | PRN

## 2022-03-07 MED ORDER — IRBESARTAN 150 MG PO TABS
150.0000 mg | ORAL_TABLET | Freq: Every day | ORAL | Status: DC
  Administered 2022-03-07 – 2022-03-09 (×3): 150 mg via ORAL
  Filled 2022-03-07 (×3): qty 1

## 2022-03-07 MED ORDER — MAGNESIUM SULFATE 2 GM/50ML IV SOLN
2.0000 g | Freq: Once | INTRAVENOUS | Status: AC
  Administered 2022-03-07: 2 g via INTRAVENOUS
  Filled 2022-03-07: qty 50

## 2022-03-07 MED ORDER — DULOXETINE HCL 30 MG PO CPEP
30.0000 mg | ORAL_CAPSULE | Freq: Every day | ORAL | Status: DC
  Administered 2022-03-07 – 2022-03-09 (×3): 30 mg via ORAL
  Filled 2022-03-07 (×3): qty 1

## 2022-03-07 MED ORDER — ATORVASTATIN CALCIUM 20 MG PO TABS
40.0000 mg | ORAL_TABLET | Freq: Every day | ORAL | Status: DC
  Administered 2022-03-07 – 2022-03-09 (×3): 40 mg via ORAL
  Filled 2022-03-07 (×3): qty 2

## 2022-03-07 MED ORDER — ENOXAPARIN SODIUM 40 MG/0.4ML IJ SOSY
40.0000 mg | PREFILLED_SYRINGE | INTRAMUSCULAR | Status: DC
  Administered 2022-03-07 – 2022-03-08 (×2): 40 mg via SUBCUTANEOUS
  Filled 2022-03-07 (×2): qty 0.4

## 2022-03-07 NOTE — H&P (Addendum)
History and Physical   Lacey Gonzalez [id number] DOB: July 13, 1955 DOA: 03/07/2022  PCP: Baxter Hire, MD  Outpatient Specialists: Dr. Haig Prophet, GI specialist Patient coming from: Home via EMS  I have personally briefly reviewed patient's old medical records in [place].  Chief Concern: Altered mental status, hypoglycemia  HPI: Ms. Lacey Gonzalez is a 67 year old female with history of hypertension, depression, hyperlipidemia, insulin-dependent diabetes mellitus, neuropathy, GERD, history of CVA with right-sided deficits, who presents emergency department for chief concerns of altered mental status.  Per ED report, when EMS arrived, patient was found to be hypoglycemic with random blood glucose testing at 52.  Per report, patient was not responsive, when EMS arrived, patient was snoring, clammy, and hypoglycemic.  Patient received to 50 mL of D10 and her blood glucose improved to 122.  Vitals in the ED showed temperature of 94.6, respiration rate of 18, heart rate 75, blood pressure 167/72, SpO2 100% on room air.  Serum sodium is 137, potassium 3.9, chloride 113, bicarb 17, BUN of 22, serum creatinine 1.09, nonfasting blood glucose 88, WBC 7.5, hemoglobin 11.4, platelets of 245.  Magnesium level was 1.5.  Lipase was 23.  Procalcitonin was less than 0.10.  Lactic acid was elevated at 2.6.  ED treatment: LR 2 L total bolus were given, magnesium 2 g IV, ceftriaxone 2 g IV one-time dose. ----------------------- At bedside, she is able to tell me her name, age, current location, current calendar year.   She reports approximately 20 pounds weight loss, due to decrease appetite. This is not intentionally.   Patient reports that she does not know when to hold her insulin.  She reports that last evening, her blood glucose read was in the fifth and she gave herself 36 units of insulin glargine.  She reports that she takes her blood glucose levels 3-4 times a day, however she reports that  she has not been educated on when to hold the insulin.  She endorses generalized loss of appetite.  She reports she likes the weight loss however she is not doing anything intentionally to try to lose weight.  She denies any home subjective fever, dysuria.  She endorses increased urinary frequency.  She endorses frequent nausea and vomiting bile this is a common problem for her.  She denies bright red blood in her vomitus or coffee-ground emesis.  She denies bright red blood per rectum.  She denies black stool.  She denies diarrhea.  Social history: She lives in her own home. And she stays occasionally with her significant other, Braulio Bosch. She denies smoking and recreational drug use. She infrequently drinks etoh, last drink was 3 nights ago, one glass of champaign.  ROS: Constitutional: no weight change, no fever ENT/Mouth: no sore throat, no rhinorrhea Eyes: no eye pain, no vision changes Cardiovascular: no chest pain, no dyspnea,  no edema, no palpitations Respiratory: no cough, no sputum, no wheezing Gastrointestinal: + nausea, + vomiting, no diarrhea, no constipation Genitourinary: no urinary incontinence, no dysuria, no hematuria, + urinary frequency Musculoskeletal: no arthralgias, no myalgias Skin: no skin lesions, no pruritus, Neuro: + weakness, no loss of consciousness, no syncope Psych: no anxiety, no depression, + decrease appetite Heme/Lymph: no bruising, no bleeding  ED Course: Discussed with EDP, patient requiring hospitalization for chief concerns of altered mental status suspect secondary to hypoglycemia.  Assessment/Plan  Principal Problem:   Altered mental status Active Problems:   DM2 (diabetes mellitus, type 2) ([place])   CVA (cerebral vascular accident) ([place])  Primary hypertension   Anxiety and depression   UTI (urinary tract infection)   Malnutrition of moderate degree   Hypoglycemia associated with diabetes ([place])   Closed compression fracture of L1  vertebra (HCC)   Unintentional weight loss   Assessment and Plan:  * Altered mental status - Presumed secondary to hypoglycemia in setting of decreased p.o. intake with continued insulin use - Fall precaution - CBG monitoring every 2 hours for 8 occurrences - Heart healthy diet ordered  DM2 (diabetes mellitus, type 2) (HCC) - Holding home insulin glargine 36 units nightly and Januvia, a.m. team to resume when the benefits outweigh the risk - Insulin SSI with at bedtime coverage ordered, thin/renal dosing - CBG monitoring, every 2 hours, 8 occurrence ordered - Goal inpatient blood glucose levels 120-140  Primary hypertension - Hydralazine 5 mg IV every 8 hours as needed for SBP greater than 175, 5 days ordered  Anxiety and depression - Buspirone 5 mg p.o. twice daily, duloxetine 30 mg daily, escitalopram 10 mg daily  Unintentional weight loss - Counseled patient on follow-up with PCP and obtain appropriate maintenance screening including mammogram, colonoscopy, Pap smears as appropriate - Patient endorses understanding  Closed compression fracture of L1 vertebra (HCC) - Resumed home meloxicam 15 mg daily - Oxycodone 5 mg every 6 hours as needed for moderate and severe pain, 15 hours ordered  Hypoglycemia associated with diabetes (HCC) - CBG monitoring every 2 hours, 8 occurrences ordered - Presumed secondary to decreased appetite, p.o. intake and continuation of insulin use complicated by acute UTI  UTI (urinary tract infection) - Ceftriaxone 1 g Daily, to Complete 7-Day Course Ordered, urine culture ordered in the ED is in process  Chart reviewed.   DVT prophylaxis: Enoxaparin Code Status: Full code Diet: Heart healthy Family Communication: Updated warfarin/significant other, Mr. Soledad Gerlach at bedside with patient's permission Disposition Plan: Pending clinical course Consults called: None at this time Admission status: PCU, inpatient  Past Medical History:   Diagnosis Date   Anxiety    DDD (degenerative disc disease), lumbar    Depression    Diabetes mellitus without complication ([place])    Gallstones    High anion gap metabolic acidosis 0000000   Hypertension    Nausea and vomiting 08/25/2021   Stroke [facility])    History reviewed. No pertinent surgical history.  Social History:  reports that she has never smoked. She has never used smokeless tobacco. She reports current alcohol use. She reports that she does not use drugs.  No Known Allergies Family History  Problem Relation Age of Onset   Diabetes Mother    Heart attack Mother    Diabetes Father    Family history: Family history reviewed and not pertinent  Prior to Admission medications   Medication Sig Start Date End Date Taking? Authorizing Provider  pantoprazole (PROTONIX) 40 MG tablet Take 40 mg by mouth daily. 02/08/22  Yes [provider]  acetaminophen (TYLENOL) 500 MG tablet Take 500 mg by mouth in the morning and at bedtime.    [provider]  amLODipine (NORVASC) 10 MG tablet Take 1 tablet (10 mg total) by mouth daily. 10/02/20   Kathie Dike, MD  aspirin EC 81 MG EC tablet Take 1 tablet (81 mg total) by mouth daily. Swallow whole. 10/02/20   Kathie Dike, MD  atorvastatin (LIPITOR) 40 MG tablet Take 1 tablet (40 mg total) by mouth daily. 10/02/20   Kathie Dike, MD  busPIRone (BUSPAR) 5 MG tablet Take 5 mg  by mouth 2 (two) times daily. 06/03/21   [provider]  DULoxetine (CYMBALTA) 30 MG capsule Take 30 mg by mouth daily. 08/28/20   [provider]  escitalopram (LEXAPRO) 10 MG tablet Take 10 mg by mouth daily. 08/02/21   [provider]  gabapentin (NEURONTIN) 300 MG capsule Take 300 mg by mouth daily. 08/02/21   [provider]  insulin glargine (LANTUS) 100 UNIT/ML injection Inject 40 Units into the skin at bedtime. 07/31/20 08/26/21  [provider]  JANUVIA 100 MG tablet Take 100 mg by mouth daily. 08/28/20    [provider]  meloxicam (MOBIC) 15 MG tablet Take 15 mg by mouth daily. 08/02/21   [provider]  metoCLOPramide (REGLAN) 10 MG tablet Take 1 tablet (10 mg total) by mouth every 8 (eight) hours as needed for nausea or vomiting. 01/08/17 02/07/17  Arta Silence, MD  Multiple Vitamin (MULTI-VITAMIN) tablet Take 1 tablet by mouth daily.    [provider]  olmesartan (BENICAR) 20 MG tablet Take 20 mg by mouth daily. 08/28/20   [provider]  ondansetron (ZOFRAN) 8 MG tablet Take 8 mg by mouth 3 (three) times daily. 08/19/21   [provider]  oxybutynin (DITROPAN-XL) 5 MG 24 hr tablet Take 5 mg by mouth daily. 06/01/21   [provider]  polyethylene glycol powder (GLYCOLAX/MIRALAX) 17 GM/SCOOP powder Take 17 g by mouth daily. 07/20/21   [provider]  senna (SENOKOT) 8.6 MG tablet  08/23/21   [provider]   Physical Exam: Vitals:   03/07/22 1435 03/07/22 1637 03/07/22 1700 03/07/22 1753  BP: 139/66  (!) 147/74 (!) 179/81  Pulse: 83  82 86  Resp: '13  16 18  '$ Temp:  97.6 F (36.4 C)  98 F (36.7 C)  TempSrc:  Oral    SpO2: 100%  100% 100%  Weight:      Height:       Constitutional: appears age-appropriate, frail, NAD, calm, comfortable Eyes: PERRL, lids and conjunctivae normal ENMT: Mucous membranes are moist. Posterior pharynx clear of any exudate or lesions. Age-appropriate dentition. Hearing appropriate Neck: normal, supple, no masses, no thyromegaly Respiratory: clear to auscultation bilaterally, no wheezing, no crackles. Normal respiratory effort. No accessory muscle use.  Cardiovascular: Regular rate and rhythm, no murmurs / rubs / gallops. No extremity edema. 2+ pedal pulses. No carotid bruits.  Abdomen: no tenderness, no masses palpated, no hepatosplenomegaly. Bowel sounds positive.  Musculoskeletal: no clubbing / cyanosis. No joint deformity upper and lower extremities. Good ROM, no contractures, no  atrophy. Normal muscle tone.  Skin: no rashes, lesions, ulcers. No induration Neurologic: Sensation intact. Strength 5/5 in all 4.  Psychiatric: Normal judgment and insight. Alert and oriented x 3.  Depressed mood.   EKG: independently reviewed, showing sinus rhythm with rate of 85, QTc 455  Chest x-ray on Admission: I personally reviewed and I agree with radiologist reading as below.  CT Head Wo Contrast  Result Date: 03/07/2022 CLINICAL DATA:  Mental status change, unknown cause. EXAM: CT HEAD WITHOUT CONTRAST TECHNIQUE: Contiguous axial images were obtained from the base of the skull through the vertex without intravenous contrast. RADIATION DOSE REDUCTION: This exam was performed according to the departmental dose-optimization program which includes automated exposure control, adjustment of the mA and/or kV according to patient size and/or use of iterative reconstruction technique. COMPARISON:  CT head and MRI brain 09/20/2020. FINDINGS: Brain: No acute intracranial hemorrhage. Unchanged mild chronic small-vessel disease. No mass effect  or midline shift. No hydrocephalus or extra-axial collection. Vascular: No hyperdense vessel or unexpected calcification. Skull: No calvarial fracture or suspicious bone lesion. Skull base is unremarkable. Sinuses/Orbits: Unremarkable. Other: None. IMPRESSION: 1. No acute intracranial abnormality. 2. Unchanged mild chronic small-vessel disease. Electronically Signed   By: Emmit Alexanders M.D.   On: 03/07/2022 13:44   DG Chest Portable 1 View  Result Date: 03/07/2022 CLINICAL DATA:  Weakness and cough. EXAM: PORTABLE CHEST 1 VIEW COMPARISON:  08/27/2016 FINDINGS: Single view of the chest. Low lung volumes without focal disease. Heart and mediastinum are within normal limits. Trachea is midline. Negative for a pneumothorax. No acute bone abnormality. IMPRESSION: Low lung volumes without acute findings. Electronically Signed   By: Markus Daft M.D.   On: 03/07/2022 10:25     Labs on Admission: I have personally reviewed following labs  CBC: Recent Labs  Lab 03/07/22 1012  WBC 7.5  NEUTROABS 5.6  HGB 11.4*  HCT 37.6  MCV 87.4  PLT [id number]   Basic Metabolic Panel: Recent Labs  Lab 03/07/22 1012 03/07/22 1254  NA 137  --   K 3.9  --   CL 113*  --   CO2 17*  --   GLUCOSE 88  --   BUN 22  --   CREATININE 1.09*  --   CALCIUM 7.9*  --   MG  --  1.5*   GFR: Estimated Creatinine Clearance: 40.2 mL/min (A) (by C-G formula based on SCr of 1.09 mg/dL (H)).  Liver Function Tests: Recent Labs  Lab 03/07/22 1012  AST 32  ALT 26  ALKPHOS 119  BILITOT 0.3  PROT 6.3*  ALBUMIN 3.1*   Recent Labs  Lab 03/07/22 1254  LIPASE 23   CBG: Recent Labs  Lab 03/07/22 1148 03/07/22 1628 03/07/22 1629 03/07/22 1656 03/07/22 1754  GLUCAP 154* 39* 43* 131* 141*   Urine analysis:    Component Value Date/Time   COLORURINE STRAW (A) 03/07/2022 1353   APPEARANCEUR CLEAR (A) 03/07/2022 1353   LABSPEC 1.001 (L) 03/07/2022 1353   PHURINE 6.0 03/07/2022 1353   GLUCOSEU >=500 (A) 03/07/2022 1353   HGBUR MODERATE (A) 03/07/2022 1353   BILIRUBINUR NEGATIVE 03/07/2022 1353   KETONESUR NEGATIVE 03/07/2022 1353   PROTEINUR NEGATIVE 03/07/2022 1353   NITRITE POSITIVE (A) 03/07/2022 1353   LEUKOCYTESUR LARGE (A) 03/07/2022 1353   This document was prepared using Dragon Voice Recognition software and may include unintentional dictation errors.  Dr. Tobie Poet [facility]  If 7PM-7AM, please contact overnight-coverage provider If 7AM-7PM, please contact day coverage provider [email or web address]  03/07/2022, 6:48 PM

## 2022-03-07 NOTE — Assessment & Plan Note (Addendum)
-   Presumed secondary to hypoglycemia in setting of decreased p.o. intake with continued insulin use - Fall precaution - CBG monitoring every 2 hours for 8 occurrences - Heart healthy diet ordered

## 2022-03-07 NOTE — ED Notes (Addendum)
Pt given 8oz of grape juice at this time. Pt given more warm blankets. MD [place] notified of pt temp.

## 2022-03-07 NOTE — Assessment & Plan Note (Addendum)
-   Holding home insulin glargine 36 units nightly and Januvia, a.m. team to resume when the benefits outweigh the risk - Insulin SSI with at bedtime coverage ordered, thin/renal dosing - CBG monitoring, every 2 hours, 8 occurrence ordered - Goal inpatient blood glucose levels 120-140

## 2022-03-07 NOTE — ED Notes (Addendum)
MD Cox notified of patient blood sugar at 39 and repeat at 43. Pt given 8ox of juice and a sandwich. Pt also eating meal tray at this time. Pt states she does not feel like her blood sugar is that low.

## 2022-03-07 NOTE — Assessment & Plan Note (Signed)
-   Buspirone 5 mg p.o. twice daily, duloxetine 30 mg daily, escitalopram 10 mg daily

## 2022-03-07 NOTE — Assessment & Plan Note (Signed)
-   Counseled patient on follow-up with PCP and obtain appropriate maintenance screening including mammogram, colonoscopy, Pap smears as appropriate - Patient endorses understanding

## 2022-03-07 NOTE — Assessment & Plan Note (Signed)
-   Hydralazine 5 mg IV every 8 hours as needed for SBP greater than 175, 5 days ordered

## 2022-03-07 NOTE — ED Triage Notes (Signed)
Pt arrived via EMS from home for hypoglycemia. Per EMS the pt family called because patient was not as responsive. Upon EMS arrival pt had snoring respiration, clammy, and had a BG of 52. Pt received 214m of D10 and her BG came up to 122. Pt has had some insulin changes recently. Pt does not remember the event or when the last time she took insulin. Pt has hx of stroke with right sided deficits. Pt is A&Ox4.

## 2022-03-07 NOTE — ED Provider Notes (Signed)
[facility] Provider Note    Event Date/Time   First MD Initiated Contact with Patient 03/07/22 [phone]     (approximate)   History   Chief Complaint Hypoglycemia   HPI  Lacey Gonzalez is a 67 y.o. female with past medical history of hypertension, diabetes, and stroke with right-sided deficits who presents to the ED complaining of hypoglycemia.  Per EMS, patient had spent the night in her recliner and husband found her this morning with sonorous respirations and patient difficult to arouse.  EMS was called and found patient to have a blood glucose of 52.  She received 250 cc of D10 and blood glucose improved to 122, mental status also seem to improve per EMS.  Patient currently states that she is feeling well, reports recent cough but otherwise denies any complaints.  She denies any fevers, chest pain, shortness of breath, nausea, or diarrhea.  She does state that she has dealt with longstanding intermittent vomiting and had an episode yesterday.  She denies any recent changes to her diabetic regimen, believes that she last used insulin last night.      Physical Exam   Triage Vital Signs: ED Triage Vitals  Enc Vitals Group     BP      Pulse      Resp      Temp      Temp src      SpO2      Weight      Height      Head Circumference      Peak Flow      Pain Score      Pain Loc      Pain Edu?      Excl. in [place]?     Most recent vital signs: Vitals:   03/07/22 1230 03/07/22 1435  BP: (!) 149/77 139/66  Pulse: 86 83  Resp: 14 13  Temp: (!) 96.4 F (35.8 C)   SpO2: 99% 100%    Constitutional: Alert and oriented. Eyes: Conjunctivae are normal. Head: Atraumatic. Nose: No congestion/rhinnorhea. Mouth/Throat: Mucous membranes are moist.  Cardiovascular: Normal rate, regular rhythm. Grossly normal heart sounds.  2+ radial pulses bilaterally. Respiratory: Normal respiratory effort.  No retractions. Lungs CTAB. Gastrointestinal: Soft and nontender.  No distention. Musculoskeletal: No lower extremity tenderness nor edema.  Neurologic:  Normal speech and language.  Chronic right-sided deficits, unchanged today.  Strength 5 out of 5 in left upper and lower extremities.    ED Results / Procedures / Treatments   Labs (all labs ordered are listed, but only abnormal results are displayed) Labs Reviewed  CBC WITH DIFFERENTIAL/PLATELET - Abnormal; Notable for the following components:      Result Value   Hemoglobin 11.4 (*)    RDW 16.9 (*)    All other components within normal limits  COMPREHENSIVE METABOLIC PANEL - Abnormal; Notable for the following components:   Chloride 113 (*)    CO2 17 (*)    Creatinine, Ser 1.09 (*)    Calcium 7.9 (*)    Total Protein 6.3 (*)    Albumin 3.1 (*)    GFR, Estimated 56 (*)    All other components within normal limits  URINALYSIS, ROUTINE W REFLEX MICROSCOPIC - Abnormal; Notable for the following components:   Color, Urine STRAW (*)    APPearance CLEAR (*)    Specific Gravity, Urine 1.001 (*)    Glucose, UA >=500 (*)    Hgb urine dipstick MODERATE (*)  Nitrite POSITIVE (*)    Leukocytes,Ua LARGE (*)    Bacteria, UA FEW (*)    All other components within normal limits  LACTIC ACID, PLASMA - Abnormal; Notable for the following components:   Lactic Acid, Venous 2.6 (*)    All other components within normal limits  MAGNESIUM - Abnormal; Notable for the following components:   Magnesium 1.5 (*)    All other components within normal limits  CBG MONITORING, ED - Abnormal; Notable for the following components:   Glucose-Capillary 154 (*)    All other components within normal limits  RESP PANEL BY RT-PCR (RSV, FLU A&B, COVID)  RVPGX2  CULTURE, BLOOD (ROUTINE X 2)  CULTURE, BLOOD (ROUTINE X 2)  URINE CULTURE  LACTIC ACID, PLASMA  PROCALCITONIN  LIPASE, BLOOD  CBG MONITORING, ED  CBG MONITORING, ED     EKG  ED ECG REPORT I, Blake Divine, the attending physician, personally viewed and  interpreted this ECG.   Date: 03/07/2022  EKG Time: 10:03  Rate: 74  Rhythm: normal sinus rhythm  Axis: LAD  Intervals:none  ST&T Change: None  ED ECG REPORT I, Blake Divine, the attending physician, personally viewed and interpreted this ECG.   Date: 03/07/2022  EKG Time: 11:49  Rate: 85  Rhythm: normal sinus rhythm, frequent PVC's noted  Axis: LAD  Intervals:none  ST&T Change: None   RADIOLOGY Chest x-ray reviewed and interpreted by me with no infiltrate, edema, or effusion.  PROCEDURES:  Critical Care performed: No  Procedures   MEDICATIONS ORDERED IN ED: Medications  cefTRIAXone (ROCEPHIN) 2 g in sodium chloride 0.9 % 100 mL IVPB (2 g Intravenous New Bag/Given 03/07/22 1432)  acetaminophen (TYLENOL) tablet 650 mg (has no administration in time range)    Or  acetaminophen (TYLENOL) suppository 650 mg (has no administration in time range)  ondansetron (ZOFRAN) tablet 4 mg (has no administration in time range)    Or  ondansetron (ZOFRAN) injection 4 mg (has no administration in time range)  enoxaparin (LOVENOX) injection 40 mg (has no administration in time range)  cefTRIAXone (ROCEPHIN) 1 g in sodium chloride 0.9 % 100 mL IVPB (has no administration in time range)  senna-docusate (Senokot-S) tablet 1 tablet (has no administration in time range)  insulin aspart (novoLOG) injection 0-9 Units (has no administration in time range)  insulin aspart (novoLOG) injection 0-5 Units (has no administration in time range)  hydrALAZINE (APRESOLINE) injection 5 mg (has no administration in time range)  lactated ringers bolus 1,000 mL (0 mLs Intravenous Stopped 03/07/22 1314)  magnesium sulfate IVPB 2 g 50 mL (0 g Intravenous Stopped 03/07/22 1452)  lactated ringers bolus 1,000 mL (0 mLs Intravenous Stopped 03/07/22 1430)     IMPRESSION / MDM / ASSESSMENT AND PLAN / ED COURSE  I reviewed the triage vital signs and the nursing notes.                              67 y.o. female  with past medical history of hypertension, diabetes, and stroke with right-sided deficits who presents to the ED after she was difficult to arouse this morning, found to be hypoglycemic by EMS.  Patient's presentation is most consistent with acute presentation with potential threat to life or bodily function.  Differential diagnosis includes, but is not limited to, hypoglycemia, medication effect, poor p.o. intake, UTI, pneumonia, AKI, electrolyte abnormality.  Patient chronically ill but nontoxic-appearing and in no acute distress, vital signs  are unremarkable.  She is awake and alert, blood glucose improved following D10 administration by EMS, however downtrending from 1 22-88 here in the ED.  We will give patient some juice to drink and crackers to eat, continue to closely monitor her blood glucose.  She reports some recent vomiting and cough, but has a benign abdominal exam and denies any urinary symptoms.  We will further assess with chest x-ray and urinalysis in addition to labs.  Glucose appears to have stabilized after patient drank some juice, but despite improved glucose levels patient still appears slightly somnolent.  CT head performed and negative for acute process, labs are unremarkable with no significant anemia, leukocytosis, or electrolyte abnormality.  Patient with mild AKI, initial lactic acid within normal limits but uptrending on recheck.  Procalcitonin within normal limits and I doubt sepsis.  Patient did come back hypomagnesemic and we will replete, also continue IV fluid hydration.  She does appear to have a UTI, likely contributing to her poor p.o. intake and hypoglycemia.  We will treat with IV Rocephin, patient does not meet sepsis criteria outside of hypothermia which is improving with warm blankets.  Case discussed with hospitalist for admission.      FINAL CLINICAL IMPRESSION(S) / ED DIAGNOSES   Final diagnoses:  Acute cystitis without hematuria  Hypoglycemia   Hypothermia, initial encounter     Rx / DC Orders   ED Discharge Orders     None        Note:  This document was prepared using Dragon voice recognition software and may include unintentional dictation errors.   Blake Divine, MD 03/07/22 1500

## 2022-03-07 NOTE — Assessment & Plan Note (Signed)
-   Resumed home meloxicam 15 mg daily - Oxycodone 5 mg every 6 hours as needed for moderate and severe pain, 15 hours ordered

## 2022-03-07 NOTE — Consult Note (Signed)
CODE SEPSIS - PHARMACY COMMUNICATION  **Broad Spectrum Antibiotics should be administered within 1 hour of Sepsis diagnosis**  Time Code Sepsis Called/Page Received: 1432  Antibiotics Ordered: ceftriaxone  Time of 1st antibiotic administration: 1432  Additional action taken by pharmacy: Luna Pier ,PharmD Clinical Pharmacist  03/07/2022  2:38 PM

## 2022-03-07 NOTE — Assessment & Plan Note (Signed)
-   Ceftriaxone 1 g Daily, to Complete 7-Day Course Ordered, urine culture ordered in the ED is in process

## 2022-03-07 NOTE — Assessment & Plan Note (Addendum)
-   CBG monitoring every 2 hours, 8 occurrences ordered - Presumed secondary to decreased appetite, p.o. intake and continuation of insulin use complicated by acute UTI

## 2022-03-07 NOTE — Hospital Course (Signed)
Ms. Lacey Gonzalez is a 68 year old female with history of hypertension, depression, hyperlipidemia, insulin-dependent diabetes mellitus, neuropathy, GERD, history of CVA with right-sided deficits, who presents emergency department for chief concerns of altered mental status.  Per ED report, when EMS arrived, patient was found to be hypoglycemic with random blood glucose testing at 52.  Per report, patient was not responsive, when EMS arrived, patient was snoring, clammy, and hypoglycemic.  Patient received to 50 mL of D10 and her blood glucose improved to 122.  Vitals in the ED showed temperature of 94.6, respiration rate of 18, heart rate 75, blood pressure 167/72, SpO2 100% on room air.  Serum sodium is 137, potassium 3.9, chloride 113, bicarb 17, BUN of 22, serum creatinine 1.09, nonfasting blood glucose 88, WBC 7.5, hemoglobin 11.4, platelets of 245.  Magnesium level was 1.5.  Lipase was 23.  Procalcitonin was less than 0.10.  Lactic acid was elevated at 2.6.  ED treatment: LR 2 L total bolus were given, magnesium 2 g IV, ceftriaxone 2 g IV one-time dose.

## 2022-03-07 NOTE — ED Notes (Signed)
MD Cox at bedside. Pt blood glucose now 131.

## 2022-03-08 DIAGNOSIS — R410 Disorientation, unspecified: Secondary | ICD-10-CM | POA: Diagnosis not present

## 2022-03-08 LAB — BASIC METABOLIC PANEL
Anion gap: 3 — ABNORMAL LOW (ref 5–15)
BUN: 24 mg/dL — ABNORMAL HIGH (ref 8–23)
CO2: 26 mmol/L (ref 22–32)
Calcium: 9.2 mg/dL (ref 8.9–10.3)
Chloride: 105 mmol/L (ref 98–111)
Creatinine, Ser: 1.38 mg/dL — ABNORMAL HIGH (ref 0.44–1.00)
GFR, Estimated: 42 mL/min — ABNORMAL LOW (ref >60)
Glucose, Bld: 103 mg/dL — ABNORMAL HIGH (ref 70–99)
Potassium: 5 mmol/L (ref 3.5–5.1)
Sodium: 134 mmol/L — ABNORMAL LOW (ref 135–145)

## 2022-03-08 LAB — BLOOD CULTURE ID PANEL (REFLEXED) - BCID2

## 2022-03-08 LAB — CBC
HCT: 38.7 % (ref 36.0–46.0)
Hemoglobin: 12.2 g/dL (ref 12.0–15.0)
MCH: 26.3 pg (ref 26.0–34.0)
MCHC: 31.5 g/dL (ref 30.0–36.0)
MCV: 83.6 fL (ref 80.0–100.0)
Platelets: 338 10*3/uL (ref 150–400)
RBC: 4.63 MIL/uL (ref 3.87–5.11)
RDW: 17.2 % — ABNORMAL HIGH (ref 11.5–15.5)
WBC: 9 10*3/uL (ref 4.0–10.5)
nRBC: 0 % (ref 0.0–0.2)

## 2022-03-08 LAB — PROTIME-INR
INR: 1 (ref 0.8–1.2)
Prothrombin Time: 13.4 seconds (ref 11.4–15.2)

## 2022-03-08 LAB — GLUCOSE, CAPILLARY
Glucose-Capillary: 118 mg/dL — ABNORMAL HIGH (ref 70–99)
Glucose-Capillary: 142 mg/dL — ABNORMAL HIGH (ref 70–99)
Glucose-Capillary: 186 mg/dL — ABNORMAL HIGH (ref 70–99)
Glucose-Capillary: 187 mg/dL — ABNORMAL HIGH (ref 70–99)
Glucose-Capillary: 217 mg/dL — ABNORMAL HIGH (ref 70–99)
Glucose-Capillary: 40 mg/dL — CL (ref 70–99)
Glucose-Capillary: 74 mg/dL (ref 70–99)
Glucose-Capillary: 76 mg/dL (ref 70–99)
Glucose-Capillary: 77 mg/dL (ref 70–99)

## 2022-03-08 LAB — MAGNESIUM: Magnesium: 2.4 mg/dL (ref 1.7–2.4)

## 2022-03-08 MED ORDER — SALINE SPRAY 0.65 % NA SOLN
1.0000 | NASAL | Status: DC | PRN
  Filled 2022-03-08: qty 44

## 2022-03-08 MED ORDER — ONDANSETRON HCL 4 MG/2ML IJ SOLN: 4.0000 mg | Freq: Four times a day (QID) | INTRAMUSCULAR | Status: DC | PRN

## 2022-03-08 MED ORDER — METOPROLOL TARTRATE 5 MG/5ML IV SOLN: 5.0000 mg | INTRAVENOUS | Status: DC | PRN

## 2022-03-08 MED ORDER — GUAIFENESIN 100 MG/5ML PO LIQD: 5.0000 mL | ORAL | Status: DC | PRN

## 2022-03-08 MED ORDER — IPRATROPIUM-ALBUTEROL 0.5-2.5 (3) MG/3ML IN SOLN: 3.0000 mL | RESPIRATORY_TRACT | Status: DC | PRN

## 2022-03-08 MED ORDER — LORATADINE 10 MG PO TABS
10.0000 mg | ORAL_TABLET | Freq: Every day | ORAL | Status: DC | PRN
  Administered 2022-03-08: 10 mg via ORAL
  Filled 2022-03-08: qty 1

## 2022-03-08 MED ORDER — ONDANSETRON HCL 4 MG PO TABS: 4.0000 mg | ORAL_TABLET | Freq: Four times a day (QID) | ORAL | Status: DC | PRN

## 2022-03-08 MED ORDER — ADULT MULTIVITAMIN W/MINERALS CH
1.0000 | ORAL_TABLET | Freq: Every day | ORAL | Status: DC
  Administered 2022-03-08 – 2022-03-09 (×2): 1 via ORAL
  Filled 2022-03-08 (×3): qty 1

## 2022-03-08 MED ORDER — HYDRALAZINE HCL 20 MG/ML IJ SOLN: 10.0000 mg | INTRAMUSCULAR | Status: DC | PRN

## 2022-03-08 MED ORDER — TRAZODONE HCL 50 MG PO TABS: 50.0000 mg | ORAL_TABLET | Freq: Every evening | ORAL | Status: DC | PRN

## 2022-03-08 MED ORDER — SENNOSIDES-DOCUSATE SODIUM 8.6-50 MG PO TABS: 1.0000 | ORAL_TABLET | Freq: Every evening | ORAL | Status: DC | PRN

## 2022-03-08 MED ORDER — GLUCERNA SHAKE PO LIQD
237.0000 mL | Freq: Three times a day (TID) | ORAL | Status: DC
  Administered 2022-03-08 – 2022-03-09 (×3): 237 mL via ORAL

## 2022-03-08 NOTE — Progress Notes (Signed)
Initial Nutrition Assessment  DOCUMENTATION CODES:   Not applicable  INTERVENTION:   -Liberalize diet to carb modified -MVI with minerals daily -Glucerna Shake po TID, each supplement provides 220 kcal and 10 grams of protein  -Extensive education on diabetes self-management provided; "Carbohydrate Counting for People with Diabetes" handout from [employer] [facility] attached to AVS/ discharge summary  NUTRITION DIAGNOSIS:   Inadequate oral intake related to decreased appetite as evidenced by per patient/family report.  GOAL:   Patient will meet greater than or equal to 90% of their needs  MONITOR:   PO intake, Supplement acceptance  REASON FOR ASSESSMENT:   Consult Diet education  ASSESSMENT:   Pt with history of hypertension, depression, hyperlipidemia, insulin-dependent diabetes mellitus, neuropathy, GERD, history of CVA with right-sided deficits, who presents for chief concerns of altered mental status.  Pt admitted with hypoglycemia and AMS.   Reviewed I/O's: -2.9 L x 24 hours   UOP: 2.9 L x 24 hours  Spoke with pt at bedside, who was pleasant and in good spirits at time of visit. Pt reports her appetite has improved since admission and consumed 100% of her lunch. Noted meal completions 100%.   Pt reports decreased appetite PTA. Pt reports she often skips one meal per day, as she does not feel hungry. Pt generally consumes 2 meals per day (TV dinner, hot dogs and fries, vegetable plate, or sandwiches).  Pt's significant other often brings food from restaurants for pt to eat.   Per pt, she used to weigh over 200# about 10 years ago and has experienced progressive weight loss secondary to lifestyle changes. Pt also admits to health challenges secondary to stroke.Reviewed wt hx; pt has experienced a 10.9% wt loss over the past 7 months. While this is not significant for time frame, it is concerning given history of poor oral intake and hypoglycemia.    Discussed home  DM management with pt. Pt reports that her DM medications are typically managed by her PCP (Dr Wynetta Emery). Pt shares that she went to a different provider in January 2023 and many medications were adjusted. Pt reports that at this time is when "my blood sugar went haywire". Pt explains that she checks her blood sugars 4-6 times per day and "it is all over the place". Pt also admits to experiencing hypoglycemia several times per week. Pt reports that she often feels "woozy" and "disoriented" when blood sugars are low, but also admits to sometimes feeling no symptoms at all (suspect there may be an element of hypoglycemia unawareness). Pt denies any difficulty obtain medications or medical visits. Pt reports she still drives occasionally, but is afraid she will "fall out" when driving and has been relying on her significant other for transportation. Pt shares that she will usually eat cake if her blood sugars start to drop. RD educated pt on signs and symptoms of hypoglycemia and how to manage hypoglycemia. Suggested pt try juice, regular soda, or hard candy for correction, as sugar items with fat delay absorption of simple carbohydrate.  Discussed different food groups and their effects on blood sugar, emphasizing carbohydrate-containing foods. Provided list of carbohydrates and recommended serving sizes of common foods. Discussed importance of controlled and consistent carbohydrate intake throughout the day. Provided examples of ways to balance meals/snacks and encouraged intake of high-fiber, whole grain complex carbohydrates. Teach back method used. Expect fair compliance.  Case discussed with DM coordinator; pt refuses CGM (although agree pt would benefit). Close follow-up with PCP for medication management has  been recommended.   Lab Results  Component Value Date   HGBA1C 11.4 (H) 08/25/2021   PTA DM medications are 40 units insulin glargine daily and 10 mg [place] daily.   Labs reviewed: CBGSL 76-187  (inpatient orders for glycemic control are 0-5 units inuslin aspart daily at bedtime and 0-9 units insulin aspart TID with meals).    NUTRITION - FOCUSED PHYSICAL EXAM:  Flowsheet Row Most Recent Value  Orbital Region No depletion  Upper Arm Region Mild depletion  Thoracic and Lumbar Region No depletion  Buccal Region No depletion  Temple Region No depletion  Clavicle Bone Region No depletion  Clavicle and Acromion Bone Region No depletion  Scapular Bone Region Mild depletion  Dorsal Hand Mild depletion  Patellar Region Mild depletion  Anterior Thigh Region Mild depletion  Posterior Calf Region Mild depletion  Edema (RD Assessment) None  Hair Reviewed  Eyes Reviewed  Mouth Reviewed  Skin Reviewed  Nails Reviewed       Diet Order:   Diet Order             Diet Carb Modified Fluid consistency: Thin  Diet effective now                   EDUCATION NEEDS:   Education needs have been addressed  Skin:  Skin Assessment: Reviewed RN Assessment  Last BM:  Unknown  Height:   Ht Readings from Last 1 Encounters:  03/07/22 '5\' 2"'$  (1.575 m)    Weight:   Wt Readings from Last 1 Encounters:  03/07/22 59.4 kg    Ideal Body Weight:  50 kg  BMI:  Body mass index is 23.96 kg/m.  Estimated Nutritional Needs:   Kcal:  [id number]  Protein:  90-105 grams  Fluid:  > 1.7 L    Loistine Chance, RD, LDN, [place] Registered Dietitian II Certified Diabetes Care and Education Specialist Please refer to [facility] for RD and/or RD on-call/weekend/after hours pager

## 2022-03-08 NOTE — Progress Notes (Signed)
PROGRESS NOTE    Lacey Gonzalez  [id number] DOB: [birth year] DOA: 03/07/2022 PCP: Baxter Hire, MD   Brief Narrative:  67 year old with history of HTN, depression, insulin-dependent DM2, neuropathy, GERD, history of CVA with right-sided deficit comes with altered mental status.  She was found to be hypoglycemic.  Patient was treated with D10 which improved her blood glucose and her mentation.   Assessment & Plan:  Principal Problem:   Altered mental status Active Problems:   DM2 (diabetes mellitus, type 2) (HCC)   CVA (cerebral vascular accident) ([place])   Primary hypertension   Anxiety and depression   UTI (urinary tract infection)   Malnutrition of moderate degree   Hypoglycemia associated with diabetes ([place])   Closed compression fracture of L1 vertebra (HCC)   Unintentional weight loss     Assessment and Plan: * Altered mental status secondary to hypoglycemia and possible urinary tract infection - Hypoglycemia seems to have improved, mentation improved.  CT head is negative for acute pathology.  UTI (urinary tract infection) -On IV Rocephin.  Follow-up cultures  DM2 (diabetes mellitus, type 2) (HCC) Hypoglycemia, improved - She is insulin-dependent.  Currently Home regimen is on hold.  Will check A1c.  Placed on sliding scale and Accu-Chek.  Diabetic coordinator consult  Home meds per records and patient "Farxiga '10mg'$  po daily, with plans to place with Januvia '30mg'$  after she was done with [facility], Aspart 8U TID Premeals, Lantus 38U qhs)  Essential hypertension - Norvasc 10 mg daily, Avapro 150 mg daily - IV as needed  Anxiety and depression - Buspirone 5 mg p.o. twice daily, duloxetine 30 mg daily, escitalopram 10 mg daily  Unintentional weight loss - Counseled that she needs to follow-up with her outpatient provider to ensure she is up-to-date with her screening.  Will consult dietitian to speak with the patient here  Closed compression fracture of L1 vertebra  (HCC) - Pain control with bowel regimen   DVT prophylaxis: enoxaparin (LOVENOX) injection 40 mg Start: 03/07/22 2200 Place TED hose Start: 03/07/22 1431 Code Status: Full Code Family Communication:    Status is: Inpatient On going eval for AMS, persistent hypoglycemia and weight loss. Cont hosp stay for atleast 24 hrs.    Subjective:  Doing better, no complaints.   Examination:  General exam: Appears calm and comfortable  Respiratory system: Clear to auscultation. Respiratory effort normal. Cardiovascular system: S1 & S2 heard, RRR. No JVD, murmurs, rubs, gallops or clicks. No pedal edema. Gastrointestinal system: Abdomen is nondistended, soft and nontender. No organomegaly or masses felt. Normal bowel sounds heard. Central nervous system: Alert and oriented. No focal neurological deficits. Extremities: Symmetric 4 x 5 power. Skin: No rashes, lesions or ulcers Psychiatry: Judgement and insight appear normal. Mood & affect appropriate.     Objective: Vitals:   03/08/22 0200 03/08/22 0400 03/08/22 0600 03/08/22 0800  BP: (!) 161/74 (!) 162/78 (!) 168/81 (!) 156/67  Pulse: 90 93 92 93  Resp: '17 10 11 '$ (!) 0  Temp:    97.8 F (36.6 C)  TempSrc:    Oral  SpO2: 100% 100% 99% 97%  Weight:      Height:        Intake/Output Summary (Last 24 hours) at 03/08/2022 0835 Last data filed at 03/08/2022 0500 Gross per 24 hour  Intake --  Output 2875 ml  Net -2875 ml   Filed Weights   03/07/22 1001  Weight: 59.4 kg     Data Reviewed:   CBC: Recent  Labs  Lab 03/07/22 1012 03/08/22 0529  WBC 7.5 9.0  NEUTROABS 5.6  --   HGB 11.4* 12.2  HCT 37.6 38.7  MCV 87.4 83.6  PLT 245 [id number]   Basic Metabolic Panel: Recent Labs  Lab 03/07/22 1012 03/07/22 1254 03/08/22 0529  NA 137  --  134*  K 3.9  --  5.0  CL 113*  --  105  CO2 17*  --  26  GLUCOSE 88  --  103*  BUN 22  --  24*  CREATININE 1.09*  --  1.38*  CALCIUM 7.9*  --  9.2  MG  --  1.5* 2.4   GFR: Estimated  Creatinine Clearance: 31.7 mL/min (A) (by C-G formula based on SCr of 1.38 mg/dL (H)). Liver Function Tests: Recent Labs  Lab 03/07/22 1012  AST 32  ALT 26  ALKPHOS 119  BILITOT 0.3  PROT 6.3*  ALBUMIN 3.1*   Recent Labs  Lab 03/07/22 1254  LIPASE 23   No results for input(s): "AMMONIA" in the last 168 hours. Coagulation Profile: Recent Labs  Lab 03/08/22 0529  INR 1.0   Cardiac Enzymes: No results for input(s): "CKTOTAL", "CKMB", "CKMBINDEX", "TROPONINI" in the last 168 hours. BNP (last 3 results) No results for input(s): "PROBNP" in the last 8760 hours. HbA1C: No results for input(s): "HGBA1C" in the last 72 hours. CBG: Recent Labs  Lab 03/08/22 0204 03/08/22 0239 03/08/22 0420 03/08/22 0557 03/08/22 0812  GLUCAP 40* 118* 142* 77 187*   Lipid Profile: No results for input(s): "CHOL", "HDL", "LDLCALC", "TRIG", "CHOLHDL", "LDLDIRECT" in the last 72 hours. Thyroid Function Tests: No results for input(s): "TSH", "T4TOTAL", "FREET4", "T3FREE", "THYROIDAB" in the last 72 hours. Anemia Panel: No results for input(s): "VITAMINB12", "FOLATE", "FERRITIN", "TIBC", "IRON", "RETICCTPCT" in the last 72 hours. Sepsis Labs: Recent Labs  Lab 03/07/22 1012 03/07/22 1045 03/07/22 1254  PROCALCITON <0.10  --   --   LATICACIDVEN  --  1.5 2.6*    Recent Results (from the past 240 hour(s))  Culture, blood (routine x 2)     Status: None (Preliminary result)   Collection Time: 03/07/22 10:45 AM   Specimen: BLOOD  Result Value Ref Range Status   Specimen Description BLOOD BLOOD LEFT HAND  Final   Special Requests   Final    AEROBIC BOTTLE ONLY Blood Culture results may not be optimal due to an inadequate volume of blood received in culture bottles   Culture   Final    NO GROWTH < 24 HOURS Performed at [facility], [address] [place], [place] [address]    Report Status PENDING  Incomplete  Culture, blood (routine x 2)     Status: None (Preliminary result)    Collection Time: 03/07/22 10:55 AM   Specimen: BLOOD  Result Value Ref Range Status   Specimen Description BLOOD RIGHT ANTECUBITAL  Final   Special Requests   Final    BOTTLES DRAWN AEROBIC AND ANAEROBIC Blood Culture adequate volume   Culture  Setup Time   Final    GRAM POSITIVE COCCI ANAEROBIC BOTTLE ONLY Organism ID to follow CRITICAL RESULT CALLED TO, READ BACK BY AND VERIFIED WITH: NATHAN BLUE'@0246'$  03/08/22 RH Performed at [facility], [address], [place], [place] [address]    Culture [facility] POSITIVE COCCI  Final   Report Status PENDING  Incomplete  Blood Culture ID Panel (Reflexed)     Status: Abnormal   Collection Time: 03/07/22 10:55 AM  Result Value Ref  Range Status   Enterococcus faecalis NOT DETECTED NOT DETECTED Final   Enterococcus Faecium NOT DETECTED NOT DETECTED Final   Listeria monocytogenes NOT DETECTED NOT DETECTED Final   Staphylococcus species DETECTED (A) NOT DETECTED Final    Comment: CRITICAL RESULT CALLED TO, READ BACK BY AND VERIFIED WITH: NATHAN BLUE'@0246'$  03/08/22 RH    Staphylococcus aureus (BCID) NOT DETECTED NOT DETECTED Final   Staphylococcus epidermidis NOT DETECTED NOT DETECTED Final   Staphylococcus lugdunensis NOT DETECTED NOT DETECTED Final   Streptococcus species NOT DETECTED NOT DETECTED Final   Streptococcus agalactiae NOT DETECTED NOT DETECTED Final   Streptococcus pneumoniae NOT DETECTED NOT DETECTED Final   Streptococcus pyogenes NOT DETECTED NOT DETECTED Final   A.calcoaceticus-baumannii NOT DETECTED NOT DETECTED Final   Bacteroides fragilis NOT DETECTED NOT DETECTED Final   Enterobacterales NOT DETECTED NOT DETECTED Final   Enterobacter cloacae complex NOT DETECTED NOT DETECTED Final   Escherichia coli NOT DETECTED NOT DETECTED Final   Klebsiella aerogenes NOT DETECTED NOT DETECTED Final   Klebsiella oxytoca NOT DETECTED NOT DETECTED Final   Klebsiella pneumoniae NOT DETECTED NOT DETECTED Final   Proteus species NOT  DETECTED NOT DETECTED Final   Salmonella species NOT DETECTED NOT DETECTED Final   Serratia marcescens NOT DETECTED NOT DETECTED Final   Haemophilus influenzae NOT DETECTED NOT DETECTED Final   Neisseria meningitidis NOT DETECTED NOT DETECTED Final   Pseudomonas aeruginosa NOT DETECTED NOT DETECTED Final   Stenotrophomonas maltophilia NOT DETECTED NOT DETECTED Final   Candida albicans NOT DETECTED NOT DETECTED Final   Candida auris NOT DETECTED NOT DETECTED Final   Candida glabrata NOT DETECTED NOT DETECTED Final   Candida krusei NOT DETECTED NOT DETECTED Final   Candida parapsilosis NOT DETECTED NOT DETECTED Final   Candida tropicalis NOT DETECTED NOT DETECTED Final   Cryptococcus neoformans/gattii NOT DETECTED NOT DETECTED Final    Comment: Performed at [facility], [address] [place], [place] [address]  Resp panel by RT-PCR (RSV, Flu A&B, Covid) Urine, Clean Catch     Status: None   Collection Time: 03/07/22  1:53 PM   Specimen: Urine, Clean Catch; Nasal Swab  Result Value Ref Range Status   SARS Coronavirus 2 by RT PCR NEGATIVE NEGATIVE Final    Comment: (NOTE) SARS-CoV-2 target nucleic acids are NOT DETECTED.  The SARS-CoV-2 RNA is generally detectable in upper respiratory specimens during the acute phase of infection. The lowest concentration of SARS-CoV-2 viral copies this assay can detect is 138 copies/mL. A negative result does not preclude SARS-Cov-2 infection and should not be used as the sole basis for treatment or other patient management decisions. A negative result may occur with  improper specimen collection/handling, submission of specimen other than nasopharyngeal swab, presence of viral mutation(s) within the areas targeted by this assay, and inadequate number of viral copies(<138 copies/mL). A negative result must be combined with clinical observations, patient history, and epidemiological information. The expected result is Negative.  Fact  Sheet for Patients:  EntrepreneurPulse.com.au  Fact Sheet for Healthcare Providers:  IncredibleEmployment.be  This test is no t yet approved or cleared by the [place] FDA and  has been authorized for detection and/or diagnosis of SARS-CoV-2 by FDA under an Emergency Use Authorization (EUA). This EUA will remain  in effect (meaning this test can be used) for the duration of the COVID-19 declaration under Section 564(b)(1) of the Act, 21 U.S.C.section 360bbb-3(b)(1), unless the authorization is terminated  or revoked sooner.  Influenza A by PCR NEGATIVE NEGATIVE Final   Influenza B by PCR NEGATIVE NEGATIVE Final    Comment: (NOTE) The Xpert Xpress SARS-CoV-2/FLU/RSV plus assay is intended as an aid in the diagnosis of influenza from Nasopharyngeal swab specimens and should not be used as a sole basis for treatment. Nasal washings and aspirates are unacceptable for Xpert Xpress SARS-CoV-2/FLU/RSV testing.  Fact Sheet for Patients: EntrepreneurPulse.com.au  Fact Sheet for Healthcare Providers: IncredibleEmployment.be  This test is not yet approved or cleared by the [place] FDA and has been authorized for detection and/or diagnosis of SARS-CoV-2 by FDA under an Emergency Use Authorization (EUA). This EUA will remain in effect (meaning this test can be used) for the duration of the COVID-19 declaration under Section 564(b)(1) of the Act, 21 U.S.C. section 360bbb-3(b)(1), unless the authorization is terminated or revoked.     Resp Syncytial Virus by PCR NEGATIVE NEGATIVE Final    Comment: (NOTE) Fact Sheet for Patients: EntrepreneurPulse.com.au  Fact Sheet for Healthcare Providers: IncredibleEmployment.be  This test is not yet approved or cleared by the [place] FDA and has been authorized for detection and/or diagnosis of SARS-CoV-2 by FDA under an  Emergency Use Authorization (EUA). This EUA will remain in effect (meaning this test can be used) for the duration of the COVID-19 declaration under Section 564(b)(1) of the Act, 21 U.S.C. section 360bbb-3(b)(1), unless the authorization is terminated or revoked.  Performed at [facility], [address] [place], [place] [address]          Radiology Studies: CT Head Wo Contrast  Result Date: 03/07/2022 CLINICAL DATA:  Mental status change, unknown cause. EXAM: CT HEAD WITHOUT CONTRAST TECHNIQUE: Contiguous axial images were obtained from the base of the skull through the vertex without intravenous contrast. RADIATION DOSE REDUCTION: This exam was performed according to the departmental dose-optimization program which includes automated exposure control, adjustment of the mA and/or kV according to patient size and/or use of iterative reconstruction technique. COMPARISON:  CT head and MRI brain 09/20/2020. FINDINGS: Brain: No acute intracranial hemorrhage. Unchanged mild chronic small-vessel disease. No mass effect or midline shift. No hydrocephalus or extra-axial collection. Vascular: No hyperdense vessel or unexpected calcification. Skull: No calvarial fracture or suspicious bone lesion. Skull base is unremarkable. Sinuses/Orbits: Unremarkable. Other: None. IMPRESSION: 1. No acute intracranial abnormality. 2. Unchanged mild chronic small-vessel disease. Electronically Signed   By: Emmit Alexanders M.D.   On: 03/07/2022 13:44   DG Chest Portable 1 View  Result Date: 03/07/2022 CLINICAL DATA:  Weakness and cough. EXAM: PORTABLE CHEST 1 VIEW COMPARISON:  08/27/2016 FINDINGS: Single view of the chest. Low lung volumes without focal disease. Heart and mediastinum are within normal limits. Trachea is midline. Negative for a pneumothorax. No acute bone abnormality. IMPRESSION: Low lung volumes without acute findings. Electronically Signed   By: Markus Daft M.D.   On: 03/07/2022 10:25         Scheduled Meds:  amLODipine  10 mg Oral Daily   aspirin EC  81 mg Oral Daily   atorvastatin  40 mg Oral Daily   busPIRone  5 mg Oral BID   DULoxetine  30 mg Oral Daily   enoxaparin (LOVENOX) injection  40 mg Subcutaneous Q24H   escitalopram  10 mg Oral Daily   gabapentin  300 mg Oral Daily   insulin aspart  0-5 Units Subcutaneous QHS   insulin aspart  0-9 Units Subcutaneous TID WC   irbesartan  150 mg Oral Daily   meloxicam  15 mg Oral Daily   oxybutynin  5 mg Oral Daily   pantoprazole  40 mg Oral Daily   Continuous Infusions:  cefTRIAXone (ROCEPHIN)  IV       LOS: 1 day   Time spent= 35 mins    Konor Noren Arsenio Loader, MD [facility]  If 7PM-7AM, please contact night-coverage  03/08/2022, 8:35 AM

## 2022-03-08 NOTE — Discharge Instructions (Addendum)
[facility] has prescription for [employer] on file. Asked that the prescription be filled and they will order it and should have it by the afternoon on 03/09/22. So please go by there to pick it up.  Call Lacey Gonzalez to make follow up appointment as soon as possible for help with adjusting medications and for specific hold parameters for insulin.   Plate Method for Diabetes   Foods with carbohydrates make your blood glucose level go up. The plate method is a simple way to meal plan and control the amount of carbohydrate you eat.         Use the following guidance to build a healthy plate to control carbohydrates. Divide a 9-inch plate into 3 sections, and consider your beverage the 4th section of your meal: Food Group Examples of Foods/Beverages for This Section of your Meal  Section 1: Non-starchy vegetables Fill  of your plate to include non-starchy vegetables Asparagus, broccoli, brussels sprouts, cabbage, carrots, cauliflower, celery, cucumber, green beans, mushrooms, peppers, salad greens, tomatoes, or zucchini.  Section 2: Protein foods Fill  of your plate to include a lean protein Lean meat, poultry, fish, seafood, cheese, eggs, lean deli meat, tofu, beans, lentils, nuts or nut butters.  Section 3: Carbohydrate foods Fill  of your plate to include carbohydrate foods Whole grains, whole wheat bread, brown rice, whole grain pasta, polenta, corn tortillas, fruit, or starchy vegetables (potatoes, green peas, corn, beans, acorn squash, and butternut squash). One cup of milk also counts as a food that contains carbohydrate.  Section 4: Beverage Choose water or a low-calorie drink for your beverage. Unsweetened tea, coffee, or flavored/sparkling water without added sugar.  Image reprinted with permission from The American Diabetes Association.  Copyright 2022 by the American Diabetes Association.   Copyright 2022  Academy of Nutrition and Dietetics. All rights reserved    Carbohydrate  Counting For People With Diabetes  Foods with carbohydrates make your blood glucose level go up. Learning how to count carbohydrates can help you control your blood glucose levels. First, identify the foods you eat that contain carbohydrates. Then, using the Foods with Carbohydrates chart, determine about how much carbohydrates are in your meals and snacks. Make sure you are eating foods with fiber, protein, and healthy fat along with your carbohydrate foods. Foods with Carbohydrates The following table shows carbohydrate foods that have about 15 grams of carbohydrate each. Using measuring cups, spoons, or a food scale when you first begin learning about carbohydrate counting can help you learn about the portion sizes you typically eat. The following foods have 15 grams carbohydrate each:  Grains 1 slice bread (1 ounce)  1 small tortilla (6-inch size)   large bagel (1 ounce)  1/3 cup pasta or rice (cooked)   hamburger or hot dog bun ( ounce)   cup cooked cereal   to  cup ready-to-eat cereal  2 taco shells (5-inch size) Fruit 1 small fresh fruit ( to 1 cup)   medium banana  17 small grapes (3 ounces)  1 cup melon or berries   cup canned or frozen fruit  2 tablespoons dried fruit (blueberries, cherries, cranberries, raisins)   cup unsweetened fruit juice  Starchy Vegetables  cup cooked beans, peas, corn, potatoes/sweet potatoes   large baked potato (3 ounces)  1 cup acorn or butternut squash  Snack Foods 3 to 6 crackers  8 potato chips or 13 tortilla chips ( ounce to 1 ounce)  3 cups popped popcorn  Dairy 3/4  cup (6 ounces) nonfat plain yogurt, or yogurt with sugar-free sweetener  1 cup milk  1 cup plain rice, soy, coconut or flavored almond milk Sweets and Desserts  cup ice cream or frozen yogurt  1 tablespoon jam, jelly, pancake syrup, table sugar, or honey  2 tablespoons light pancake syrup  1 inch square of frosted cake or 2 inch square of unfrosted cake  2 small  cookies (2/3 ounce each) or  large cookie  Sometimes you'll have to estimate carbohydrate amounts if you don't know the exact recipe. One cup of mixed foods like soups can have 1 to 2 carbohydrate servings, while some casseroles might have 2 or more servings of carbohydrate. Foods that have less than 20 calories in each serving can be counted as "free" foods. Count 1 cup raw vegetables, or  cup cooked non-starchy vegetables as "free" foods. If you eat 3 or more servings at one meal, then count them as 1 carbohydrate serving.  Foods without Carbohydrates  Not all foods contain carbohydrates. Meat, some dairy, fats, non-starchy vegetables, and many beverages don't contain carbohydrate. So when you count carbohydrates, you can generally exclude chicken, pork, beef, fish, seafood, eggs, tofu, cheese, butter, sour cream, avocado, nuts, seeds, olives, mayonnaise, water, black coffee, unsweetened tea, and zero-calorie drinks. Vegetables with no or low carbohydrate include green beans, cauliflower, tomatoes, and onions. How much carbohydrate should I eat at each meal?  Carbohydrate counting can help you plan your meals and manage your weight. Following are some starting points for carbohydrate intake at each meal. Work with your registered dietitian nutritionist to find the best range that works for your blood glucose and weight.   To Lose Weight To Maintain Weight  Women 2 - 3 carb servings 3 - 4 carb servings  Men 3 - 4 carb servings 4 - 5 carb servings  Checking your blood glucose after meals will help you know if you need to adjust the timing, type, or number of carbohydrate servings in your meal plan. Achieve and keep a healthy body weight by balancing your food intake and physical activity.  Tips How should I plan my meals?  Plan for half the food on your plate to include non-starchy vegetables, like salad greens, broccoli, or carrots. Try to eat 3 to 5 servings of non-starchy vegetables every day.  Have a protein food at each meal. Protein foods include chicken, fish, meat, eggs, or beans (note that beans contain carbohydrate). These two food groups (non-starchy vegetables and proteins) are low in carbohydrate. If you fill up your plate with these foods, you will eat less carbohydrate but still fill up your stomach. Try to limit your carbohydrate portion to  of the plate.  What fats are healthiest to eat?  Diabetes increases risk for heart disease. To help protect your heart, eat more healthy fats, such as olive oil, nuts, and avocado. Eat less saturated fats like butter, cream, and high-fat meats, like bacon and sausage. Avoid trans fats, which are in all foods that list "partially hydrogenated oil" as an ingredient. What should I drink?  Choose drinks that are not sweetened with sugar. The healthiest choices are water, carbonated or seltzer waters, and tea and coffee without added sugars.  Sweet drinks will make your blood glucose go up very quickly. One serving of soda or energy drink is  cup. It is best to drink these beverages only if your blood glucose is low.  Artificially sweetened, or diet drinks, typically do not increase  your blood glucose if they have zero calories in them. Read labels of beverages, as some diet drinks do have carbohydrate and will raise your blood glucose. Label Reading Tips Read Nutrition Facts labels to find out how many grams of carbohydrate are in a food you want to eat. Don't forget: sometimes serving sizes on the label aren't the same as how much food you are going to eat, so you may need to calculate how much carbohydrate is in the food you are serving yourself.   Carbohydrate Counting for People with Diabetes Sample 1-Day Menu  Breakfast  cup yogurt, low fat, low sugar (1 carbohydrate serving)   cup cereal, ready-to-eat, unsweetened (1 carbohydrate serving)  1 cup strawberries (1 carbohydrate serving)   cup almonds ( carbohydrate serving)  Lunch 1, 5  ounce can chunk light tuna  2 ounces cheese, low fat cheddar  6 whole wheat crackers (1 carbohydrate serving)  1 small apple (1 carbohydrate servings)   cup carrots ( carbohydrate serving)   cup snap peas  1 cup 1% milk (1 carbohydrate serving)   Evening Meal Stir fry made with: 3 ounces chicken  1 cup brown rice (3 carbohydrate servings)   cup broccoli ( carbohydrate serving)   cup green beans   cup onions  1 tablespoon olive oil  2 tablespoons teriyaki sauce ( carbohydrate serving)  Evening Snack 1 extra small banana (1 carbohydrate serving)  1 tablespoon peanut butter   Carbohydrate Counting for People with Diabetes Vegan Sample 1-Day Menu  Breakfast 1 cup cooked oatmeal (2 carbohydrate servings)   cup blueberries (1 carbohydrate serving)  2 tablespoons flaxseeds  1 cup soymilk fortified with calcium and vitamin D  1 cup coffee  Lunch 2 slices whole wheat bread (2 carbohydrate servings)   cup baked tofu   cup lettuce  2 slices tomato  2 slices avocado   cup baby carrots ( carbohydrate serving)  1 orange (1 carbohydrate serving)  1 cup soymilk fortified with calcium and vitamin D   Evening Meal Burrito made with: 1 6-inch corn tortilla (1 carbohydrate serving)  1 cup refried vegetarian beans (2 carbohydrate servings)   cup chopped tomatoes   cup lettuce   cup salsa  1/3 cup brown rice (1 carbohydrate serving)  1 tablespoon olive oil for rice   cup zucchini   Evening Snack 6 small whole grain crackers (1 carbohydrate serving)  2 apricots ( carbohydrate serving)   cup unsalted peanuts ( carbohydrate serving)    Carbohydrate Counting for People with Diabetes Vegetarian (Lacto-Ovo) Sample 1-Day Menu  Breakfast 1 cup cooked oatmeal (2 carbohydrate servings)   cup blueberries (1 carbohydrate serving)  2 tablespoons flaxseeds  1 egg  1 cup 1% milk (1 carbohydrate serving)  1 cup coffee  Lunch 2 slices whole wheat bread (2 carbohydrate servings)  2  ounces low-fat cheese   cup lettuce  2 slices tomato  2 slices avocado   cup baby carrots ( carbohydrate serving)  1 orange (1 carbohydrate serving)  1 cup unsweetened tea  Evening Meal Burrito made with: 1 6-inch corn tortilla (1 carbohydrate serving)   cup refried vegetarian beans (1 carbohydrate serving)   cup tomatoes   cup lettuce   cup salsa  1/3 cup brown rice (1 carbohydrate serving)  1 tablespoon olive oil for rice   cup zucchini  1 cup 1% milk (1 carbohydrate serving)  Evening Snack 6 small whole grain crackers (1 carbohydrate serving)  2 apricots ( carbohydrate serving)  cup unsalted peanuts ( carbohydrate serving)    Copyright 2020  Academy of Nutrition and Dietetics. All rights reserved.  Using Nutrition Labels: Carbohydrate  Serving Size  Look at the serving size. All the information on the label is based on this portion. Servings Per Container  The number of servings contained in the package. Guidelines for Carbohydrate  Look at the total grams of carbohydrate in the serving size.  1 carbohydrate choice = 15 grams of carbohydrate. Range of Carbohydrate Grams Per Choice  Carbohydrate Grams/Choice Carbohydrate Choices  6-10   11-20 1  21-25 1  26-35 2  36-40 2  41-50 3  51-55 3  56-65 4  66-70 4  71-80 5    Copyright 2020  Academy of Nutrition and Dietetics. All rights reserved.

## 2022-03-08 NOTE — Inpatient Diabetes Management (Addendum)
Inpatient Diabetes Program Recommendations  AACE/ADA: New Consensus Statement on Inpatient Glycemic Control   Target Ranges:  Prepandial:   less than 140 mg/dL      Peak postprandial:   less than 180 mg/dL (1-2 hours)      Critically ill patients:  140 - 180 mg/dL    Latest Reference Range & Units 03/08/22 00:02 03/08/22 02:04 03/08/22 02:39 03/08/22 04:20 03/08/22 05:57 03/08/22 08:12  Glucose-Capillary 70 - 99 mg/dL 74 40 (LL) 118 (H) 142 (H) 77 187 (H)    Latest Reference Range & Units 03/07/22 11:48 03/07/22 16:28 03/07/22 16:29 03/07/22 16:56 03/07/22 17:54 03/07/22 20:16 03/07/22 21:55 03/08/22 00:02  Glucose-Capillary 70 - 99 mg/dL 154 (H) 39 (LL) 43 (LL) 131 (H) 141 (H) 152 (H) 100 (H) 74    Latest Reference Range & Units 03/07/22 09:56 03/07/22 10:49  Glucose-Capillary 70 - 99 mg/dL 88 80    Latest Reference Range & Units 08/25/21 12:38  Hemoglobin A1C 4.8 - 5.6 % 11.4 (H)   Review of Glycemic Control  Diabetes history: DM2 Outpatient Diabetes medications: Lantus 36 units QHS, Januvia 100 mg daily (not taking), Farxiga 10 mg daily, Novolog 6-8 units with meals Current orders for Inpatient glycemic control: Novolog 0-9 units TID with meals, Novolog 0-5 units QHS  Inpatient Diabetes Program Recommendations:    HbgA1C: A1C in Care Everywhere 8.7% on 02/23/22.  NOTE: Patient H&P on 03/07/22 patient admitted with hypoglycemia, altered mental status, UTI, and noted to have unintentional weight loss of 20 lbs. In reviewing chart, noted patient seen Birdena Crandall, CPP on 02/23/22 and per note patient was asked to "Stop taking your Novolog while you are not eating. Continue Lantus and Farxiga for diabetes and all your other medications." Will plan to talk to patient.  Addendum 03/08/22'@13'$ :63- Spoke with patient at bedside about DM medications and control. Patient states that her PCP and Almyra Free (pharmacist at PCP office) help with DM management and medications. She reports that she is taking  Lantus 38 units QHS, Novolog 6-8 units with meals, and Farxiga 10 mg daily for DM control. Patient confirms that she seen Almyra Free on 02/23/22 and was told to switch to [place]. Discussed that per office note by Almyra Free on 02/23/22 she was asked to stop the Novolog while not eating. Patient states she does not eat much because she doesn't want to gain weight back but she has still been taking the Novolog.  Patient reports that she had stopped taking the Januvia and started using the [place] as directed and she has had even more hypoglycemic episodes since switching to [place]. Patient states that her significant other knew her sugar was low by her "wild eyed look" and tried to give her orange juice but she was not able to drink it. Inquired about Baqsimi she was prescribed on 02/23/22 and patient stated she was asked about filling it and told the pharmacy she did not need it. Discussed Baqsimi and how it could be used by others to help her if she has hypoglycemia. Patient states she was not aware of what it was and she will plan to get it filled now. Patient reports she has not reached out to [facility] since then regarding lows. She notes she has urinary urgency and that she has UTI currently; therefore may want to consider stopping the [place] outpatient for now. Discussed using a continuous glucose monitoring sensor and the safety benefit of being alerted when glucose is low. Unfortunately patient is not willing to use a  CGM; she reports her significant other has DM1 and uses them and they "fall off early, not worth the trouble, and are a nuisance".  Anticipate patient needs to decrease the Lantus dose more. Asked patient to call Almyra Free and set up follow up, to get Baqsimi filled to have on hand for hypoglycemia treatment, and get specific hold parameters for her Lantus from [place]. Asked patient to pay close attention to her discharge paperwork for instructions regarding her DM medications in case anything is changed. Patient  verbalized understanding of information discussed. [facility] and they do have Rx for Baqsimi on file for patient; asked that the prescription be filled and they will order it and should have it by tomorrow afternoon.    Thanks, Barnie Alderman, RN, MSN, [facility] Coordinator Inpatient Diabetes Program [phone] (Team Pager from 8am to [place])

## 2022-03-08 NOTE — Progress Notes (Signed)
PHARMACY - PHYSICIAN COMMUNICATION CRITICAL VALUE ALERT - BLOOD CULTURE IDENTIFICATION (BCID)  Results for orders placed or performed during the hospital encounter of 03/07/22  Culture, blood (routine x 2)     Status: None (Preliminary result)   Collection Time: 03/07/22 10:55 AM   Specimen: BLOOD  Result Value Ref Range Status   Specimen Description BLOOD RIGHT ANTECUBITAL  Final   Special Requests   Final    BOTTLES DRAWN AEROBIC AND ANAEROBIC Blood Culture adequate volume   Culture  Setup Time   Final    GRAM POSITIVE COCCI ANAEROBIC BOTTLE ONLY Organism ID to follow CRITICAL RESULT CALLED TO, READ BACK BY AND VERIFIED WITH: Arhum Peeples BLUE'@0246'$  03/08/22 RH Performed at [facility], [address] [place], [place] [address]    Culture GRAM POSITIVE COCCI  Final   Report Status PENDING  Incomplete  Blood Culture ID Panel (Reflexed)     Status: Abnormal   Collection Time: 03/07/22 10:55 AM  Result Value Ref Range Status   Enterococcus faecalis NOT DETECTED NOT DETECTED Final   Enterococcus Faecium NOT DETECTED NOT DETECTED Final   Listeria monocytogenes NOT DETECTED NOT DETECTED Final   Staphylococcus species DETECTED (A) NOT DETECTED Final    Comment: CRITICAL RESULT CALLED TO, READ BACK BY AND VERIFIED WITH: Yakub Lodes BLUE'@0246'$  03/08/22 RH    Staphylococcus aureus (BCID) NOT DETECTED NOT DETECTED Final   Staphylococcus epidermidis NOT DETECTED NOT DETECTED Final   Staphylococcus lugdunensis NOT DETECTED NOT DETECTED Final   Streptococcus species NOT DETECTED NOT DETECTED Final   Streptococcus agalactiae NOT DETECTED NOT DETECTED Final   Streptococcus pneumoniae NOT DETECTED NOT DETECTED Final   Streptococcus pyogenes NOT DETECTED NOT DETECTED Final   A.calcoaceticus-baumannii NOT DETECTED NOT DETECTED Final   Bacteroides fragilis NOT DETECTED NOT DETECTED Final   Enterobacterales NOT DETECTED NOT DETECTED Final   Enterobacter cloacae complex NOT DETECTED NOT DETECTED  Final   Escherichia coli NOT DETECTED NOT DETECTED Final   Klebsiella aerogenes NOT DETECTED NOT DETECTED Final   Klebsiella oxytoca NOT DETECTED NOT DETECTED Final   Klebsiella pneumoniae NOT DETECTED NOT DETECTED Final   Proteus species NOT DETECTED NOT DETECTED Final   Salmonella species NOT DETECTED NOT DETECTED Final   Serratia marcescens NOT DETECTED NOT DETECTED Final   Haemophilus influenzae NOT DETECTED NOT DETECTED Final   Neisseria meningitidis NOT DETECTED NOT DETECTED Final   Pseudomonas aeruginosa NOT DETECTED NOT DETECTED Final   Stenotrophomonas maltophilia NOT DETECTED NOT DETECTED Final   Candida albicans NOT DETECTED NOT DETECTED Final   Candida auris NOT DETECTED NOT DETECTED Final   Candida glabrata NOT DETECTED NOT DETECTED Final   Candida krusei NOT DETECTED NOT DETECTED Final   Candida parapsilosis NOT DETECTED NOT DETECTED Final   Candida tropicalis NOT DETECTED NOT DETECTED Final   Cryptococcus neoformans/gattii NOT DETECTED NOT DETECTED Final    Comment: Performed at [facility], [address] [place], [place] [address]  Resp panel by RT-PCR (RSV, Flu A&B, Covid) Urine, Clean Catch     Status: None   Collection Time: 03/07/22  1:53 PM   Specimen: Urine, Clean Catch; Nasal Swab  Result Value Ref Range Status   SARS Coronavirus 2 by RT PCR NEGATIVE NEGATIVE Final    Comment: (NOTE) SARS-CoV-2 target nucleic acids are NOT DETECTED.  The SARS-CoV-2 RNA is generally detectable in upper respiratory specimens during the acute phase of infection. The lowest concentration of SARS-CoV-2 viral copies this assay can detect is 138 copies/mL.  A negative result does not preclude SARS-Cov-2 infection and should not be used as the sole basis for treatment or other patient management decisions. A negative result may occur with  improper specimen collection/handling, submission of specimen other than nasopharyngeal swab, presence of viral mutation(s) within  the areas targeted by this assay, and inadequate number of viral copies(<138 copies/mL). A negative result must be combined with clinical observations, patient history, and epidemiological information. The expected result is Negative.  Fact Sheet for Patients:  EntrepreneurPulse.com.au  Fact Sheet for Healthcare Providers:  IncredibleEmployment.be  This test is no t yet approved or cleared by the [place] FDA and  has been authorized for detection and/or diagnosis of SARS-CoV-2 by FDA under an Emergency Use Authorization (EUA). This EUA will remain  in effect (meaning this test can be used) for the duration of the COVID-19 declaration under Section 564(b)(1) of the Act, 21 U.S.C.section 360bbb-3(b)(1), unless the authorization is terminated  or revoked sooner.       Influenza A by PCR NEGATIVE NEGATIVE Final   Influenza B by PCR NEGATIVE NEGATIVE Final    Comment: (NOTE) The Xpert Xpress SARS-CoV-2/FLU/RSV plus assay is intended as an aid in the diagnosis of influenza from Nasopharyngeal swab specimens and should not be used as a sole basis for treatment. Nasal washings and aspirates are unacceptable for Xpert Xpress SARS-CoV-2/FLU/RSV testing.  Fact Sheet for Patients: EntrepreneurPulse.com.au  Fact Sheet for Healthcare Providers: IncredibleEmployment.be  This test is not yet approved or cleared by the [place] FDA and has been authorized for detection and/or diagnosis of SARS-CoV-2 by FDA under an Emergency Use Authorization (EUA). This EUA will remain in effect (meaning this test can be used) for the duration of the COVID-19 declaration under Section 564(b)(1) of the Act, 21 U.S.C. section 360bbb-3(b)(1), unless the authorization is terminated or revoked.     Resp Syncytial Virus by PCR NEGATIVE NEGATIVE Final    Comment: (NOTE) Fact Sheet for  Patients: EntrepreneurPulse.com.au  Fact Sheet for Healthcare Providers: IncredibleEmployment.be  This test is not yet approved or cleared by the [place] FDA and has been authorized for detection and/or diagnosis of SARS-CoV-2 by FDA under an Emergency Use Authorization (EUA). This EUA will remain in effect (meaning this test can be used) for the duration of the COVID-19 declaration under Section 564(b)(1) of the Act, 21 U.S.C. section 360bbb-3(b)(1), unless the authorization is terminated or revoked.  Performed at [facility], [address] [place], [place] [address]     BCID Results: 1 (anaerobic) of 4 bottles w/ Staph species, no resistance.  Pt currently on Ceftriaxone for UTI.  Name of provider contacted: Morton Amy, NP   Changes to prescribed antibiotics required: No changes at this time pending additional results.  Renda Rolls, PharmD, [facility] 03/08/2022 3:47 AM

## 2022-03-09 DIAGNOSIS — R410 Disorientation, unspecified: Secondary | ICD-10-CM | POA: Diagnosis not present

## 2022-03-09 LAB — BASIC METABOLIC PANEL
Anion gap: 5 (ref 5–15)
BUN: 31 mg/dL — ABNORMAL HIGH (ref 8–23)
CO2: 23 mmol/L (ref 22–32)
Calcium: 8.9 mg/dL (ref 8.9–10.3)
Chloride: 105 mmol/L (ref 98–111)
Creatinine, Ser: 1.3 mg/dL — ABNORMAL HIGH (ref 0.44–1.00)
GFR, Estimated: 45 mL/min — ABNORMAL LOW (ref >60)
Glucose, Bld: 204 mg/dL — ABNORMAL HIGH (ref 70–99)
Potassium: 5.7 mmol/L — ABNORMAL HIGH (ref 3.5–5.1)
Sodium: 133 mmol/L — ABNORMAL LOW (ref 135–145)

## 2022-03-09 LAB — CBC
HCT: 36.9 % (ref 36.0–46.0)
Hemoglobin: 11.8 g/dL — ABNORMAL LOW (ref 12.0–15.0)
MCH: 27 pg (ref 26.0–34.0)
MCHC: 32 g/dL (ref 30.0–36.0)
MCV: 84.4 fL (ref 80.0–100.0)
Platelets: 284 10*3/uL (ref 150–400)
RBC: 4.37 MIL/uL (ref 3.87–5.11)
RDW: 16.9 % — ABNORMAL HIGH (ref 11.5–15.5)
WBC: 7.6 10*3/uL (ref 4.0–10.5)
nRBC: 0 % (ref 0.0–0.2)

## 2022-03-09 LAB — HEMOGLOBIN A1C
Hgb A1c MFr Bld: 8 % — ABNORMAL HIGH (ref 4.8–5.6)
Mean Plasma Glucose: 183 mg/dL

## 2022-03-09 LAB — GLUCOSE, CAPILLARY
Glucose-Capillary: 125 mg/dL — ABNORMAL HIGH (ref 70–99)
Glucose-Capillary: 179 mg/dL — ABNORMAL HIGH (ref 70–99)
Glucose-Capillary: 187 mg/dL — ABNORMAL HIGH (ref 70–99)
Glucose-Capillary: 222 mg/dL — ABNORMAL HIGH (ref 70–99)
Glucose-Capillary: 239 mg/dL — ABNORMAL HIGH (ref 70–99)

## 2022-03-09 LAB — MAGNESIUM: Magnesium: 2.1 mg/dL (ref 1.7–2.4)

## 2022-03-09 LAB — POTASSIUM: Potassium: 4.8 mmol/L (ref 3.5–5.1)

## 2022-03-09 MED ORDER — SODIUM ZIRCONIUM CYCLOSILICATE 10 G PO PACK
10.0000 g | PACK | Freq: Once | ORAL | Status: AC
  Administered 2022-03-09: 10 g via ORAL
  Filled 2022-03-09: qty 1

## 2022-03-09 MED ORDER — INSULIN GLARGINE 100 UNIT/ML ~~LOC~~ SOLN: 5.0000 [IU] | Freq: Every evening | SUBCUTANEOUS | 0 refills | Status: AC

## 2022-03-09 MED ORDER — CEPHALEXIN 500 MG PO CAPS: 500.0000 mg | ORAL_CAPSULE | Freq: Three times a day (TID) | ORAL | 0 refills | Status: AC

## 2022-03-09 NOTE — TOC Initial Note (Signed)
Transition of Care [facility]) - Initial/Assessment Note    Patient Details  Name: Lacey Gonzalez MRN: [id number] Date of Birth: 28-Jun-1955  Transition of Care [facility]) CM/SW Contact:    Laurena Slimmer, RN Phone Number: 03/09/2022, 1:33 PM  Clinical Narrative:                  Transition of Care (TOC) Screening Note   Patient Details  Name: Lacey Gonzalez Date of Birth: 1955-04-01   Transition of Care [facility]) CM/SW Contact:    Laurena Slimmer, RN Phone Number: 03/09/2022, 1:34 PM    Transition of Care Department [facility]) has reviewed patient and no TOC needs have been identified at this time. We will continue to monitor patient advancement through interdisciplinary progression rounds. If new patient transition needs arise, please place a TOC consult.          Patient Goals and CMS Choice            Expected Discharge Plan and Services         Expected Discharge Date: 03/09/22                                    Prior Living Arrangements/Services                       Activities of Daily Living Home Assistive Devices/Equipment: CBG Meter ADL Screening (condition at time of admission) Patient's cognitive ability adequate to safely complete daily activities?: Yes Is the patient deaf or have difficulty hearing?: No Does the patient have difficulty seeing, even when wearing glasses/contacts?: No Does the patient have difficulty concentrating, remembering, or making decisions?: No Patient able to express need for assistance with ADLs?: Yes Does the patient have difficulty dressing or bathing?: No Independently performs ADLs?: Yes (appropriate for developmental age) Does the patient have difficulty walking or climbing stairs?: No Weakness of Legs: Right Weakness of Arms/Hands: Right  Permission Sought/Granted                  Emotional Assessment              Admission diagnosis:  Altered mental status [R41.82] Hypoglycemia [E16.2] Acute  cystitis without hematuria [N30.00] Hypothermia, initial encounter [T68.XXXA] Patient Active Problem List   Diagnosis Date Noted   Altered mental status 03/07/2022   Hypoglycemia associated with diabetes ([place]) 03/07/2022   Closed compression fracture of L1 vertebra ([place]) 03/07/2022   Unintentional weight loss 03/07/2022   Acute pyelonephritis 08/25/2021   Cholelithiasis 08/25/2021   Malnutrition of moderate degree 09/21/2020   CVA (cerebral vascular accident) ([place]) 09/20/2020   UTI (urinary tract infection) 09/20/2020   Anxiety and depression 07/03/2020   Primary hypertension 07/03/2020   DM2 (diabetes mellitus, type 2) ([place]) 04/13/2017   PCP:  Baxter Hire, MD Pharmacy:   [facility], [address] [place] [address] Phone: [phone] Fax: [phone]     Social Determinants of Health (SDOH) Social History: SDOH Screenings   Food Insecurity: No Food Insecurity (03/07/2022)  Housing: Low Risk  (03/07/2022)  Transportation Needs: No Transportation Needs (03/07/2022)  Utilities: Not At Risk (03/07/2022)  Tobacco Use: Low Risk  (03/07/2022)   SDOH Interventions:     Readmission Risk Interventions     No data to display

## 2022-03-09 NOTE — Discharge Summary (Signed)
Physician Discharge Summary  Lacey Gonzalez [id number] DOB: 1955/03/14 DOA: 03/07/2022  PCP: Baxter Hire, MD  Admit date: 03/07/2022 Discharge date: 03/09/2022  Admitted From: Home Disposition:  HOme  Recommendations for Outpatient Follow-up:  Follow up with PCP in 1-2 weeks Please obtain BMP/CBC in one week your next doctors visit.  Lantus 5U daily, increase as needed. Continue Januvia and Novolog  Dc Farxiga.  PO Keflex for UTI Would benefit from Endocrine referral, will defer to PCP    Discharge Condition: Stable CODE STATUS: Full Diet recommendation: Diabetic  Brief/Interim Summary: 67 year old with history of HTN, depression, insulin-dependent DM2, neuropathy, GERD, history of CVA with right-sided deficit comes with altered mental status.  She was found to be hypoglycemic.  Patient was treated with D10 which improved her blood glucose and her mentation.  There was some confusion with patient's medication that she was taking outpatient but eventually it was determined that she will be on Lantus 5 units daily, Januvia 100 mg daily, NovoLog premeals and stopping [place]. Ucx grew Klebsilla therefore on Abx.   Stable for dc today. She is ambulating well.      Assessment & Plan:  Principal Problem:   Altered mental status Active Problems:   DM2 (diabetes mellitus, type 2) (HCC)   CVA (cerebral vascular accident) ([place])   Primary hypertension   Anxiety and depression   UTI (urinary tract infection)   Malnutrition of moderate degree   Hypoglycemia associated with diabetes ([place])   Closed compression fracture of L1 vertebra (HCC)   Unintentional weight loss       Assessment and Plan: * Altered mental status secondary to hypoglycemia and possible urinary tract infection - CT head is negative.  With hypoglycemia her mentation is improved.    UTI (urinary tract infection) -On IV Rocephin.  Cultures are growing Klebsiella, sensitivities to follow. Past sensitivities  reviewed. Will dc on Keflex.    DM2 (diabetes mellitus, type 2) (HCC) Hypoglycemia, improved - She is insulin-dependent.   A1c is 8.0.  Seen by diabetic coordinator.     Plan will be to discharge patient on Lantus 5 units daily, Januvia 100 mg daily, NovoLog Premeal and stop [place].   Hyperkalemia -EKG is nonacute. Repeat K thi afternoon is 4.8. Repeat lab in 1 week.    Essential hypertension - Resume home meds.    Anxiety and depression - Buspirone 5 mg p.o. twice daily, duloxetine 30 mg daily, escitalopram 10 mg daily   Unintentional weight loss - Follow up with PCP to ensure she is uptodate with her cancer screening.    Closed compression fracture of L1 vertebra (HCC) - Pain control with bowel regimen         Consultations: None  Subjective: Feels ok, wants to go home. No complaints.   Discharge Exam: Vitals:   03/09/22 0739 03/09/22 1139  BP: (!) 155/79 (!) 157/68  Pulse: 86 87  Resp: 18 19  Temp: 98.4 F (36.9 C) 98.1 F (36.7 C)  SpO2: 100% 100%   Vitals:   03/09/22 0400 03/09/22 0500 03/09/22 0739 03/09/22 1139  BP: (!) 166/94  (!) 155/79 (!) 157/68  Pulse: 86 87 86 87  Resp: '18 16 18 19  '$ Temp: 97.6 F (36.4 C)  98.4 F (36.9 C) 98.1 F (36.7 C)  TempSrc: Oral  Oral Oral  SpO2: 98% 94% 100% 100%  Weight:      Height:        General: Pt is alert, awake, not in acute  distress Cardiovascular: RRR, S1/S2 +, no rubs, no gallops Respiratory: CTA bilaterally, no wheezing, no rhonchi Abdominal: Soft, NT, ND, bowel sounds + Extremities: no edema, no cyanosis  Discharge Instructions   Allergies as of 03/09/2022   No Known Allergies      Medication List     TAKE these medications    acetaminophen 500 MG tablet Commonly known as: TYLENOL Take 500 mg by mouth in the morning and at bedtime.   amLODipine 10 MG tablet Commonly known as: NORVASC Take 1 tablet (10 mg total) by mouth daily.   aspirin EC 81 MG tablet Take 1 tablet (81 mg  total) by mouth daily. Swallow whole.   atorvastatin 40 MG tablet Commonly known as: LIPITOR Take 1 tablet (40 mg total) by mouth daily.   busPIRone 5 MG tablet Commonly known as: BUSPAR Take 5 mg by mouth 2 (two) times daily.   cephALEXin 500 MG capsule Commonly known as: KEFLEX Take 1 capsule (500 mg total) by mouth 3 (three) times daily for 5 days.   DULoxetine 30 MG capsule Commonly known as: CYMBALTA Take 30 mg by mouth daily.   escitalopram 10 MG tablet Commonly known as: LEXAPRO Take 10 mg by mouth daily.   gabapentin 300 MG capsule Commonly known as: NEURONTIN Take 300 mg by mouth daily.   insulin glargine 100 UNIT/ML injection Commonly known as: LANTUS Inject 0.05 mLs (5 Units total) into the skin at bedtime. What changed: how much to take   Januvia 100 MG tablet Generic drug: sitaGLIPtin Take 100 mg by mouth daily.   meloxicam 15 MG tablet Commonly known as: MOBIC Take 15 mg by mouth daily.   metoCLOPramide 10 MG tablet Commonly known as: REGLAN Take 1 tablet (10 mg total) by mouth every 8 (eight) hours as needed for nausea or vomiting.   Multi-Vitamin tablet Take 1 tablet by mouth daily.   olmesartan 20 MG tablet Commonly known as: BENICAR Take 20 mg by mouth daily.   ondansetron 8 MG tablet Commonly known as: ZOFRAN Take 8 mg by mouth 3 (three) times daily.   oxybutynin 5 MG 24 hr tablet Commonly known as: DITROPAN-XL Take 5 mg by mouth daily.   pantoprazole 40 MG tablet Commonly known as: PROTONIX Take 40 mg by mouth daily.   polyethylene glycol powder 17 GM/SCOOP powder Commonly known as: GLYCOLAX/MIRALAX Take 17 g by mouth daily.   senna 8.6 MG tablet Commonly known as: SENOKOT        No Known Allergies  You were cared for by a hospitalist during your hospital stay. If you have any questions about your discharge medications or the care you received while you were in the hospital after you are discharged, you can call the unit  and asked to speak with the hospitalist on call if the hospitalist that took care of you is not available. Once you are discharged, your primary care physician will handle any further medical issues. Please note that no refills for any discharge medications will be authorized once you are discharged, as it is imperative that you return to your primary care physician (or establish a relationship with a primary care physician if you do not have one) for your aftercare needs so that they can reassess your need for medications and monitor your lab values.  You were cared for by a hospitalist during your hospital stay. If you have any questions about your discharge medications or the care you received while you were in the hospital  after you are discharged, you can call the unit and asked to speak with the hospitalist on call if the hospitalist that took care of you is not available. Once you are discharged, your primary care physician will handle any further medical issues. Please note that NO REFILLS for any discharge medications will be authorized once you are discharged, as it is imperative that you return to your primary care physician (or establish a relationship with a primary care physician if you do not have one) for your aftercare needs so that they can reassess your need for medications and monitor your lab values.  Please request your Prim.MD to go over all Hospital Tests and Procedure/Radiological results at the follow up, please get all Hospital records sent to your Prim MD by signing hospital release before you go home.  Get CBC, CMP, 2 view Chest X ray checked  by Primary MD during your next visit or SNF MD in 5-7 days ( we routinely change or add medications that can affect your baseline labs and fluid status, therefore we recommend that you get the mentioned basic workup next visit with your PCP, your PCP may decide not to get them or add new tests based on their clinical decision)  On your next  visit with your primary care physician please Get Medicines reviewed and adjusted.  If you experience worsening of your admission symptoms, develop shortness of breath, life threatening emergency, suicidal or homicidal thoughts you must seek medical attention immediately by calling 911 or calling your MD immediately  if symptoms less severe.  You Must read complete instructions/literature along with all the possible adverse reactions/side effects for all the Medicines you take and that have been prescribed to you. Take any new Medicines after you have completely understood and accpet all the possible adverse reactions/side effects.   Do not drive, operate heavy machinery, perform activities at heights, swimming or participation in water activities or provide baby sitting services if your were admitted for syncope or siezures until you have seen by Primary MD or a Neurologist and advised to do so again.  Do not drive when taking Pain medications.   Procedures/Studies: CT Head Wo Contrast  Result Date: 03/07/2022 CLINICAL DATA:  Mental status change, unknown cause. EXAM: CT HEAD WITHOUT CONTRAST TECHNIQUE: Contiguous axial images were obtained from the base of the skull through the vertex without intravenous contrast. RADIATION DOSE REDUCTION: This exam was performed according to the departmental dose-optimization program which includes automated exposure control, adjustment of the mA and/or kV according to patient size and/or use of iterative reconstruction technique. COMPARISON:  CT head and MRI brain 09/20/2020. FINDINGS: Brain: No acute intracranial hemorrhage. Unchanged mild chronic small-vessel disease. No mass effect or midline shift. No hydrocephalus or extra-axial collection. Vascular: No hyperdense vessel or unexpected calcification. Skull: No calvarial fracture or suspicious bone lesion. Skull base is unremarkable. Sinuses/Orbits: Unremarkable. Other: None. IMPRESSION: 1. No acute intracranial  abnormality. 2. Unchanged mild chronic small-vessel disease. Electronically Signed   By: Emmit Alexanders M.D.   On: 03/07/2022 13:44   DG Chest Portable 1 View  Result Date: 03/07/2022 CLINICAL DATA:  Weakness and cough. EXAM: PORTABLE CHEST 1 VIEW COMPARISON:  08/27/2016 FINDINGS: Single view of the chest. Low lung volumes without focal disease. Heart and mediastinum are within normal limits. Trachea is midline. Negative for a pneumothorax. No acute bone abnormality. IMPRESSION: Low lung volumes without acute findings. Electronically Signed   By: Markus Daft M.D.   On: 03/07/2022 10:25  DG Lumbar Spine 2-3 Views  Result Date: 03/01/2022 CLINICAL DATA:  L1 compression fracture. Pain across upper lumbar spine radiating down EXAM: LUMBAR SPINE - 2-3 VIEW COMPARISON:  CT of the lumbar spine 02/15/2022 FINDINGS: Increased compression fracture of L1 with now greater than 75% vertebral body height loss. Suggestion of increased retropulsion of the superior endplate of L1 though this is not optimally evaluated radiographically. Remainder unchanged. No additional fracture. Loss of disc space height at L1-L2 is moderate. Moderate lower lumbar facet arthropathy. IMPRESSION: Increased compression fracture of L1 compared with 02/15/2022, with now greater than 75% vertebral body height loss. Possible increased retropulsion of the superior endplate. Electronically Signed   By: Placido Sou M.D.   On: 03/01/2022 20:48   CT Lumbar Spine Wo Contrast  Result Date: 02/15/2022 CLINICAL DATA:  Lumbar compression fracture. Low back pain after a fall today. EXAM: CT LUMBAR SPINE WITHOUT CONTRAST TECHNIQUE: Multidetector CT imaging of the lumbar spine was performed without intravenous contrast administration. Multiplanar CT image reconstructions were also generated. RADIATION DOSE REDUCTION: This exam was performed according to the departmental dose-optimization program which includes automated exposure control, adjustment of  the mA and/or kV according to patient size and/or use of iterative reconstruction technique. COMPARISON:  Lumbar spine radiographs 02/15/2022 FINDINGS: Segmentation: 5 lumbar type vertebral bodies. Alignment: Normal alignment of the lumbar spine. Vertebrae: Acute appearing comminuted compression fracture of the L1 vertebra with about 40% loss of height. Fracture lines extend from the anterior to the posterior vertebral surface. There is minimal retrolisthesis of fracture fragment posteriorly. Fractures involve the base of the right pedicle. Posterior elements are otherwise intact. No other acute fractures are demonstrated. Paraspinal and other soft tissues: No abnormal paraspinal soft tissue mass or infiltration. Visualized retroperitoneal structures appear grossly intact. Disc levels: Mild degenerative changes with disc space narrowing and small osteophyte formation throughout the lumbar spine. Degenerative changes in the posterior facet joints. IMPRESSION: Acute comminuted compression fracture of the L1 vertebra with mild retrolisthesis of fracture fragments. Fracture involves the base of the right pedicle. Normal alignment. Electronically Signed   By: Lucienne Capers M.D.   On: 02/15/2022 21:10   CT Hip Right Wo Contrast  Result Date: 02/15/2022 CLINICAL DATA:  Hip trauma with fracture suspected. Patient fell today. Low back pain and pelvic pain. EXAM: CT OF THE RIGHT HIP WITHOUT CONTRAST TECHNIQUE: Multidetector CT imaging of the right hip was performed according to the standard protocol. Multiplanar CT image reconstructions were also generated. RADIATION DOSE REDUCTION: This exam was performed according to the departmental dose-optimization program which includes automated exposure control, adjustment of the mA and/or kV according to patient size and/or use of iterative reconstruction technique. COMPARISON:  Hip radiographs 02/15/2022 FINDINGS: Bones/Joint/Cartilage Degenerative changes in the right hip  with joint space narrowing and sclerosis. Osteophyte formation on the femoral head. No acute fracture or dislocation is identified. Changes on prior plain radiographs likely resulted from degenerative change. Visualized right hemipelvis appears intact. Ligaments Suboptimally assessed by CT. Muscles and Tendons No intramuscular hematoma or collection is identified. Soft tissues No soft tissue hematoma or contusion. Visualized pelvic organs are grossly intact. Vascular calcifications. IMPRESSION: Degenerative changes in the right hip account for radiographic findings. No evidence of acute fracture or dislocation. Electronically Signed   By: Lucienne Capers M.D.   On: 02/15/2022 21:06   DG Lumbar Spine 2-3 Views  Result Date: 02/15/2022 CLINICAL DATA:  Hip pain after a fall today. EXAM: LUMBAR SPINE - 2-3 VIEW COMPARISON:  CT  abdomen and pelvis 08/27/2016 and 08/25/2021 FINDINGS: Five lumbar type vertebral bodies. There is anterior compression of the L1 vertebra with some anterior fragmentation suggesting acute fracture. This is new since prior study. Suggestion of cortical irregularity at the posterior vertebral margin. There is about 40% loss of height anteriorly. Degenerative changes throughout the lumbar spine with narrowed interspaces and endplate osteophyte formation. Degenerative changes in the facet joints. Visualized sacrum appears intact. IMPRESSION: Anterior compression of the L1 vertebra appears acute. Electronically Signed   By: Lucienne Capers M.D.   On: 02/15/2022 20:35   DG Hip Unilat W or Wo Pelvis 2-3 Views Right  Result Date: 02/15/2022 CLINICAL DATA:  Hip pain after a fall today. Low back pain and pelvic pain. EXAM: DG HIP (WITH OR WITHOUT PELVIS) 2-3V RIGHT COMPARISON:  None Available. FINDINGS: Degenerative changes in the lower lumbar spine and in both hips. The right hip is mildly rotated with some sclerosis at the femoral neck and mild cortical irregularity. Changes may indicate a  nondisplaced impacted subcapital fracture. No inter trochanteric involvement. Pelvis and visualized sacrum appear intact. SI joints and symphysis pubis are not displaced. IMPRESSION: Probable impacted subcapital fracture of the proximal right femur. Electronically Signed   By: Lucienne Capers M.D.   On: 02/15/2022 20:33     The results of significant diagnostics from this hospitalization (including imaging, microbiology, ancillary and laboratory) are listed below for reference.     Microbiology: Recent Results (from the past 240 hour(s))  Culture, blood (routine x 2)     Status: None (Preliminary result)   Collection Time: 03/07/22 10:45 AM   Specimen: BLOOD  Result Value Ref Range Status   Specimen Description BLOOD BLOOD LEFT HAND  Final   Special Requests   Final    AEROBIC BOTTLE ONLY Blood Culture results may not be optimal due to an inadequate volume of blood received in culture bottles   Culture   Final    NO GROWTH 2 DAYS Performed at [facility], [address] [place], [place] [address]    Report Status PENDING  Incomplete  Culture, blood (routine x 2)     Status: Abnormal (Preliminary result)   Collection Time: 03/07/22 10:55 AM   Specimen: BLOOD  Result Value Ref Range Status   Specimen Description   Final    BLOOD RIGHT ANTECUBITAL Performed at [facility], [address], [place], [place] [address]    Special Requests   Final    BOTTLES DRAWN AEROBIC AND ANAEROBIC Blood Culture adequate volume Performed at [facility], [address] [place], [place] [address]    Culture  Setup Time   Final    [place] CRITICAL RESULT CALLED TO, READ BACK BY AND VERIFIED WITH: NATHAN BLUE'@0246'$  03/08/22 RH    Culture (A)  Final    STAPHYLOCOCCUS HOMINIS THE SIGNIFICANCE OF ISOLATING THIS ORGANISM FROM A SINGLE SET OF BLOOD CULTURES WHEN MULTIPLE SETS ARE DRAWN IS UNCERTAIN. PLEASE NOTIFY THE MICROBIOLOGY DEPARTMENT  WITHIN ONE WEEK IF SPECIATION AND SENSITIVITIES ARE REQUIRED. Performed at [facility], [place] [address], [place], [place] [address]    Report Status PENDING  Incomplete  Blood Culture ID Panel (Reflexed)     Status: Abnormal   Collection Time: 03/07/22 10:55 AM  Result Value Ref Range Status   Enterococcus faecalis NOT DETECTED NOT DETECTED Final   Enterococcus Faecium NOT DETECTED NOT DETECTED Final   Listeria monocytogenes NOT DETECTED NOT DETECTED  Final   Staphylococcus species DETECTED (A) NOT DETECTED Final    Comment: CRITICAL RESULT CALLED TO, READ BACK BY AND VERIFIED WITH: NATHAN BLUE'@0246'$  03/08/22 RH    Staphylococcus aureus (BCID) NOT DETECTED NOT DETECTED Final   Staphylococcus epidermidis NOT DETECTED NOT DETECTED Final   Staphylococcus lugdunensis NOT DETECTED NOT DETECTED Final   Streptococcus species NOT DETECTED NOT DETECTED Final   Streptococcus agalactiae NOT DETECTED NOT DETECTED Final   Streptococcus pneumoniae NOT DETECTED NOT DETECTED Final   Streptococcus pyogenes NOT DETECTED NOT DETECTED Final   A.calcoaceticus-baumannii NOT DETECTED NOT DETECTED Final   Bacteroides fragilis NOT DETECTED NOT DETECTED Final   Enterobacterales NOT DETECTED NOT DETECTED Final   Enterobacter cloacae complex NOT DETECTED NOT DETECTED Final   Escherichia coli NOT DETECTED NOT DETECTED Final   Klebsiella aerogenes NOT DETECTED NOT DETECTED Final   Klebsiella oxytoca NOT DETECTED NOT DETECTED Final   Klebsiella pneumoniae NOT DETECTED NOT DETECTED Final   Proteus species NOT DETECTED NOT DETECTED Final   Salmonella species NOT DETECTED NOT DETECTED Final   Serratia marcescens NOT DETECTED NOT DETECTED Final   Haemophilus influenzae NOT DETECTED NOT DETECTED Final   Neisseria meningitidis NOT DETECTED NOT DETECTED Final   Pseudomonas aeruginosa NOT DETECTED NOT DETECTED Final   Stenotrophomonas maltophilia NOT DETECTED NOT DETECTED Final   Candida albicans NOT DETECTED NOT  DETECTED Final   Candida auris NOT DETECTED NOT DETECTED Final   Candida glabrata NOT DETECTED NOT DETECTED Final   Candida krusei NOT DETECTED NOT DETECTED Final   Candida parapsilosis NOT DETECTED NOT DETECTED Final   Candida tropicalis NOT DETECTED NOT DETECTED Final   Cryptococcus neoformans/gattii NOT DETECTED NOT DETECTED Final    Comment: Performed at [facility], [address] [place], [place] [address]  Resp panel by RT-PCR (RSV, Flu A&B, Covid) Urine, Clean Catch     Status: None   Collection Time: 03/07/22  1:53 PM   Specimen: Urine, Clean Catch; Nasal Swab  Result Value Ref Range Status   SARS Coronavirus 2 by RT PCR NEGATIVE NEGATIVE Final    Comment: (NOTE) SARS-CoV-2 target nucleic acids are NOT DETECTED.  The SARS-CoV-2 RNA is generally detectable in upper respiratory specimens during the acute phase of infection. The lowest concentration of SARS-CoV-2 viral copies this assay can detect is 138 copies/mL. A negative result does not preclude SARS-Cov-2 infection and should not be used as the sole basis for treatment or other patient management decisions. A negative result may occur with  improper specimen collection/handling, submission of specimen other than nasopharyngeal swab, presence of viral mutation(s) within the areas targeted by this assay, and inadequate number of viral copies(<138 copies/mL). A negative result must be combined with clinical observations, patient history, and epidemiological information. The expected result is Negative.  Fact Sheet for Patients:  EntrepreneurPulse.com.au  Fact Sheet for Healthcare Providers:  IncredibleEmployment.be  This test is no t yet approved or cleared by the [place] FDA and  has been authorized for detection and/or diagnosis of SARS-CoV-2 by FDA under an Emergency Use Authorization (EUA). This EUA will remain  in effect (meaning this test can be used) for the  duration of the COVID-19 declaration under Section 564(b)(1) of the Act, 21 U.S.C.section 360bbb-3(b)(1), unless the authorization is terminated  or revoked sooner.       Influenza A by PCR NEGATIVE NEGATIVE Final   Influenza B by PCR NEGATIVE NEGATIVE Final    Comment: (NOTE) The Xpert Xpress SARS-CoV-2/FLU/RSV plus assay  is intended as an aid in the diagnosis of influenza from Nasopharyngeal swab specimens and should not be used as a sole basis for treatment. Nasal washings and aspirates are unacceptable for Xpert Xpress SARS-CoV-2/FLU/RSV testing.  Fact Sheet for Patients: EntrepreneurPulse.com.au  Fact Sheet for Healthcare Providers: IncredibleEmployment.be  This test is not yet approved or cleared by the [place] FDA and has been authorized for detection and/or diagnosis of SARS-CoV-2 by FDA under an Emergency Use Authorization (EUA). This EUA will remain in effect (meaning this test can be used) for the duration of the COVID-19 declaration under Section 564(b)(1) of the Act, 21 U.S.C. section 360bbb-3(b)(1), unless the authorization is terminated or revoked.     Resp Syncytial Virus by PCR NEGATIVE NEGATIVE Final    Comment: (NOTE) Fact Sheet for Patients: EntrepreneurPulse.com.au  Fact Sheet for Healthcare Providers: IncredibleEmployment.be  This test is not yet approved or cleared by the [place] FDA and has been authorized for detection and/or diagnosis of SARS-CoV-2 by FDA under an Emergency Use Authorization (EUA). This EUA will remain in effect (meaning this test can be used) for the duration of the COVID-19 declaration under Section 564(b)(1) of the Act, 21 U.S.C. section 360bbb-3(b)(1), unless the authorization is terminated or revoked.  Performed at [facility], [address] [place], [place] [address]   Urine Culture (for pregnant, neutropenic or urologic  patients or patients with an indwelling urinary catheter)     Status: Abnormal (Preliminary result)   Collection Time: 03/07/22  1:53 PM   Specimen: Urine, Random  Result Value Ref Range Status   Specimen Description   Final    URINE, RANDOM Performed at [facility], [address] [place], [place] [address]    Special Requests   Final    NONE Performed at [facility], [address], [place], [place] [address]    Culture (A)  Final    >=100,000 COLONIES/mL KLEBSIELLA OXYTOCA SUSCEPTIBILITIES TO FOLLOW Performed at [facility], [place] [address], [place], [place] [address]    Report Status PENDING  Incomplete     Labs: BNP (last 3 results) No results for input(s): "BNP" in the last 8760 hours. Basic Metabolic Panel: Recent Labs  Lab 03/07/22 1012 03/07/22 1254 03/08/22 0529 03/09/22 0336 03/09/22 1207  NA 137  --  134* 133*  --   K 3.9  --  5.0 5.7* 4.8  CL 113*  --  105 105  --   CO2 17*  --  26 23  --   GLUCOSE 88  --  103* 204*  --   BUN 22  --  24* 31*  --   CREATININE 1.09*  --  1.38* 1.30*  --   CALCIUM 7.9*  --  9.2 8.9  --   MG  --  1.5* 2.4 2.1  --    Liver Function Tests: Recent Labs  Lab 03/07/22 1012  AST 32  ALT 26  ALKPHOS 119  BILITOT 0.3  PROT 6.3*  ALBUMIN 3.1*   Recent Labs  Lab 03/07/22 1254  LIPASE 23   No results for input(s): "AMMONIA" in the last 168 hours. CBC: Recent Labs  Lab 03/07/22 1012 03/08/22 0529 03/09/22 0336  WBC 7.5 9.0 7.6  NEUTROABS 5.6  --   --   HGB 11.4* 12.2 11.8*  HCT 37.6 38.7 36.9  MCV 87.4 83.6 84.4  PLT 245 338 284   Cardiac Enzymes: No results for input(s): "CKTOTAL", "CKMB", "CKMBINDEX", "TROPONINI"  in the last 168 hours. BNP: Invalid input(s): "POCBNP" CBG: Recent Labs  Lab 03/08/22 2051 03/09/22 0007 03/09/22 0406 03/09/22 0740 03/09/22 1141  GLUCAP 217* 187* 179* 125* 239*   D-Dimer No results for input(s): "DDIMER" in the last 72 hours. Hgb A1c Recent  Labs    03/08/22 0529  HGBA1C 8.0*   Lipid Profile No results for input(s): "CHOL", "HDL", "LDLCALC", "TRIG", "CHOLHDL", "LDLDIRECT" in the last 72 hours. Thyroid function studies No results for input(s): "TSH", "T4TOTAL", "T3FREE", "THYROIDAB" in the last 72 hours.  Invalid input(s): "FREET3" Anemia work up No results for input(s): "VITAMINB12", "FOLATE", "FERRITIN", "TIBC", "IRON", "RETICCTPCT" in the last 72 hours. Urinalysis    Component Value Date/Time   COLORURINE STRAW (A) 03/07/2022 1353   APPEARANCEUR CLEAR (A) 03/07/2022 1353   LABSPEC 1.001 (L) 03/07/2022 1353   PHURINE 6.0 03/07/2022 1353   GLUCOSEU >=500 (A) 03/07/2022 1353   HGBUR MODERATE (A) 03/07/2022 1353   BILIRUBINUR NEGATIVE 03/07/2022 1353   KETONESUR NEGATIVE 03/07/2022 1353   PROTEINUR NEGATIVE 03/07/2022 1353   NITRITE POSITIVE (A) 03/07/2022 1353   LEUKOCYTESUR LARGE (A) 03/07/2022 1353   Sepsis Labs Recent Labs  Lab 03/07/22 1012 03/08/22 0529 03/09/22 0336  WBC 7.5 9.0 7.6   Microbiology Recent Results (from the past 240 hour(s))  Culture, blood (routine x 2)     Status: None (Preliminary result)   Collection Time: 03/07/22 10:45 AM   Specimen: BLOOD  Result Value Ref Range Status   Specimen Description BLOOD BLOOD LEFT HAND  Final   Special Requests   Final    AEROBIC BOTTLE ONLY Blood Culture results may not be optimal due to an inadequate volume of blood received in culture bottles   Culture   Final    NO GROWTH 2 DAYS Performed at [facility], [address], [place], [place] [address]    Report Status PENDING  Incomplete  Culture, blood (routine x 2)     Status: Abnormal (Preliminary result)   Collection Time: 03/07/22 10:55 AM   Specimen: BLOOD  Result Value Ref Range Status   Specimen Description   Final    BLOOD RIGHT ANTECUBITAL Performed at [facility], [address] [place], [place] [address]    Special Requests   Final    BOTTLES DRAWN AEROBIC  AND ANAEROBIC Blood Culture adequate volume Performed at [facility], [address] [place], [place] [address]    Culture  Setup Time   Final    GRAM POSITIVE COCCI ANAEROBIC BOTTLE ONLY CRITICAL RESULT CALLED TO, READ BACK BY AND VERIFIED WITH: NATHAN BLUE'@0246'$  03/08/22 RH    Culture (A)  Final    STAPHYLOCOCCUS HOMINIS THE SIGNIFICANCE OF ISOLATING THIS ORGANISM FROM A SINGLE SET OF BLOOD CULTURES WHEN MULTIPLE SETS ARE DRAWN IS UNCERTAIN. PLEASE NOTIFY THE MICROBIOLOGY DEPARTMENT WITHIN ONE WEEK IF SPECIATION AND SENSITIVITIES ARE REQUIRED. Performed at [facility], [place] [address] [place], [place] [address]    Report Status PENDING  Incomplete  Blood Culture ID Panel (Reflexed)     Status: Abnormal   Collection Time: 03/07/22 10:55 AM  Result Value Ref Range Status   Enterococcus faecalis NOT DETECTED NOT DETECTED Final   Enterococcus Faecium NOT DETECTED NOT DETECTED Final   Listeria monocytogenes NOT DETECTED NOT DETECTED Final   Staphylococcus species DETECTED (A) NOT DETECTED Final    Comment: CRITICAL RESULT CALLED TO, READ BACK BY AND VERIFIED WITH: NATHAN BLUE'@0246'$  03/08/22 RH    Staphylococcus  aureus (BCID) NOT DETECTED NOT DETECTED Final   Staphylococcus epidermidis NOT DETECTED NOT DETECTED Final   Staphylococcus lugdunensis NOT DETECTED NOT DETECTED Final   Streptococcus species NOT DETECTED NOT DETECTED Final   Streptococcus agalactiae NOT DETECTED NOT DETECTED Final   Streptococcus pneumoniae NOT DETECTED NOT DETECTED Final   Streptococcus pyogenes NOT DETECTED NOT DETECTED Final   A.calcoaceticus-baumannii NOT DETECTED NOT DETECTED Final   Bacteroides fragilis NOT DETECTED NOT DETECTED Final   Enterobacterales NOT DETECTED NOT DETECTED Final   Enterobacter cloacae complex NOT DETECTED NOT DETECTED Final   Escherichia coli NOT DETECTED NOT DETECTED Final   Klebsiella aerogenes NOT DETECTED NOT DETECTED Final   Klebsiella oxytoca NOT DETECTED NOT  DETECTED Final   Klebsiella pneumoniae NOT DETECTED NOT DETECTED Final   Proteus species NOT DETECTED NOT DETECTED Final   Salmonella species NOT DETECTED NOT DETECTED Final   Serratia marcescens NOT DETECTED NOT DETECTED Final   Haemophilus influenzae NOT DETECTED NOT DETECTED Final   Neisseria meningitidis NOT DETECTED NOT DETECTED Final   Pseudomonas aeruginosa NOT DETECTED NOT DETECTED Final   Stenotrophomonas maltophilia NOT DETECTED NOT DETECTED Final   Candida albicans NOT DETECTED NOT DETECTED Final   Candida auris NOT DETECTED NOT DETECTED Final   Candida glabrata NOT DETECTED NOT DETECTED Final   Candida krusei NOT DETECTED NOT DETECTED Final   Candida parapsilosis NOT DETECTED NOT DETECTED Final   Candida tropicalis NOT DETECTED NOT DETECTED Final   Cryptococcus neoformans/gattii NOT DETECTED NOT DETECTED Final    Comment: Performed at [facility], [address] [place], [place] [address]  Resp panel by RT-PCR (RSV, Flu A&B, Covid) Urine, Clean Catch     Status: None   Collection Time: 03/07/22  1:53 PM   Specimen: Urine, Clean Catch; Nasal Swab  Result Value Ref Range Status   SARS Coronavirus 2 by RT PCR NEGATIVE NEGATIVE Final    Comment: (NOTE) SARS-CoV-2 target nucleic acids are NOT DETECTED.  The SARS-CoV-2 RNA is generally detectable in upper respiratory specimens during the acute phase of infection. The lowest concentration of SARS-CoV-2 viral copies this assay can detect is 138 copies/mL. A negative result does not preclude SARS-Cov-2 infection and should not be used as the sole basis for treatment or other patient management decisions. A negative result may occur with  improper specimen collection/handling, submission of specimen other than nasopharyngeal swab, presence of viral mutation(s) within the areas targeted by this assay, and inadequate number of viral copies(<138 copies/mL). A negative result must be combined with clinical observations,  patient history, and epidemiological information. The expected result is Negative.  Fact Sheet for Patients:  EntrepreneurPulse.com.au  Fact Sheet for Healthcare Providers:  IncredibleEmployment.be  This test is no t yet approved or cleared by the [place] FDA and  has been authorized for detection and/or diagnosis of SARS-CoV-2 by FDA under an Emergency Use Authorization (EUA). This EUA will remain  in effect (meaning this test can be used) for the duration of the COVID-19 declaration under Section 564(b)(1) of the Act, 21 U.S.C.section 360bbb-3(b)(1), unless the authorization is terminated  or revoked sooner.       Influenza A by PCR NEGATIVE NEGATIVE Final   Influenza B by PCR NEGATIVE NEGATIVE Final    Comment: (NOTE) The Xpert Xpress SARS-CoV-2/FLU/RSV plus assay is intended as an aid in the diagnosis of influenza from Nasopharyngeal swab specimens and should not be used as a sole basis for treatment. Nasal washings and aspirates are unacceptable for  Xpert Xpress SARS-CoV-2/FLU/RSV testing.  Fact Sheet for Patients: EntrepreneurPulse.com.au  Fact Sheet for Healthcare Providers: IncredibleEmployment.be  This test is not yet approved or cleared by the [place] FDA and has been authorized for detection and/or diagnosis of SARS-CoV-2 by FDA under an Emergency Use Authorization (EUA). This EUA will remain in effect (meaning this test can be used) for the duration of the COVID-19 declaration under Section 564(b)(1) of the Act, 21 U.S.C. section 360bbb-3(b)(1), unless the authorization is terminated or revoked.     Resp Syncytial Virus by PCR NEGATIVE NEGATIVE Final    Comment: (NOTE) Fact Sheet for Patients: EntrepreneurPulse.com.au  Fact Sheet for Healthcare Providers: IncredibleEmployment.be  This test is not yet approved or cleared by the [place] FDA and has been authorized for detection and/or diagnosis of SARS-CoV-2 by FDA under an Emergency Use Authorization (EUA). This EUA will remain in effect (meaning this test can be used) for the duration of the COVID-19 declaration under Section 564(b)(1) of the Act, 21 U.S.C. section 360bbb-3(b)(1), unless the authorization is terminated or revoked.  Performed at [facility], [address], [place], [place] [address]   Urine Culture (for pregnant, neutropenic or urologic patients or patients with an indwelling urinary catheter)     Status: Abnormal (Preliminary result)   Collection Time: 03/07/22  1:53 PM   Specimen: Urine, Random  Result Value Ref Range Status   Specimen Description   Final    URINE, RANDOM Performed at [facility], [address] [place], [place] [address]    Special Requests   Final    NONE Performed at [facility], [address] [place], [place] [address]    Culture (A)  Final    >=100,000 COLONIES/mL KLEBSIELLA OXYTOCA SUSCEPTIBILITIES TO FOLLOW Performed at [facility], [place] [address], [place], [place] [address]    Report Status PENDING  Incomplete     Time coordinating discharge:  I have spent 35 minutes face to face with the patient and on the ward discussing the patients care, assessment, plan and disposition with other care givers. >50% of the time was devoted counseling the patient about the risks and benefits of treatment/Discharge disposition and coordinating care.   SIGNED:   Damita Lack, MD  [facility] 03/09/2022, 12:41 PM   If 7PM-7AM, please contact night-coverage

## 2022-03-09 NOTE — Evaluation (Signed)
Occupational Therapy Evaluation Patient Details Name: Lacey Gonzalez MRN: [id number] DOB: 05-11-1955 Today's Date: 03/09/2022   History of Present Illness Ms. Lacey Gonzalez is a 67 year old female with history of hypertension, depression, hyperlipidemia, insulin-dependent diabetes mellitus, neuropathy, GERD, history of CVA with right-sided deficits, who presents emergency department for chief concerns of altered mental status. Per ED report, when EMS arrived, patient was found to be hypoglycemic with random blood glucose testing at 52.  Per report, patient was not responsive, when EMS arrived, patient was snoring, clammy, and hypoglycemic. CT head negative for acute intracranial abnormality.   Clinical Impression   Chart reviewed, pt greeted in room agreeable to OT evaluation. Pt is alert and oriented x4, good safety awareness. Pt appears to be performing ADL/functional mobility at/close to baseline. No functional deficits noted at this time. No further OT needs identified. Pt is in agreement. Please reconsult if there is a change in functional status.    Recommendations for follow up therapy are one component of a multi-disciplinary discharge planning process, led by the attending physician.  Recommendations may be updated based on patient status, additional functional criteria and insurance authorization.   Follow Up Recommendations  No OT follow up     Assistance Recommended at Discharge PRN  Patient can return home with the following Assistance with cooking/housework;Assist for transportation;Help with stairs or ramp for entrance    Functional Status Assessment  Patient has not had a recent decline in their functional status  Equipment Recommendations  Tub/shower seat    Recommendations for Other Services       Precautions / Restrictions Precautions Precautions: Fall Restrictions Weight Bearing Restrictions: No      Mobility Bed Mobility Overal bed mobility: Modified  Independent             General bed mobility comments: supine<>sit    Transfers Overall transfer level: Modified independent Equipment used: Rolling walker (2 wheels)               General transfer comment: STS from EOB and toilet      Balance Overall balance assessment: Mild deficits observed, not formally tested               ADL either performed or assessed with clinical judgement   ADL Overall ADL's : At baseline       General ADL Comments: Pt completed bed mobility with Mod I, functional mobility to the bathroom with superivision using RW, toilet transfer with Mod I (BSC frame placed over toilet 2/2 having handicapped toilet at home), supervision for peri care and sinkside grooming tasks.     Vision Baseline Vision/History: 1 Wears glasses (readers) Patient Visual Report: No change from baseline       Perception     Praxis      Pertinent Vitals/Pain Pain Assessment Pain Assessment: No/denies pain     Hand Dominance Right   Extremity/Trunk Assessment Upper Extremity Assessment Upper Extremity Assessment: RUE deficits/detail RUE Deficits / Details: previous CVA, shoulder flexion limited to ~110 deg, thump opposition impaired, poor grip strength RUE Coordination: decreased fine motor   Lower Extremity Assessment Lower Extremity Assessment: Defer to PT evaluation       Communication Communication Communication: No difficulties   Cognition Arousal/Alertness: Awake/alert Behavior During Therapy: WFL for tasks assessed/performed Overall Cognitive Status: Within Functional Limits for tasks assessed           General Comments: A&Ox4, followed commands well     General Comments  Exercises Other Exercises Other Exercises: OT provided education re: role of OT, OT POC, post acute recs, sitting up for all meals, EOB/OOB mobility with assistance, home/fall safety, showed pt how to order shower seat/bench for tub/shower at home    Shoulder Instructions      [place] expects to be discharged to:: Private residence Living Arrangements: Spouse/significant other Available Help at Discharge: Available PRN/intermittently;Family (SO works during the day) Type of Home: House Home Access: Stairs to enter Technical brewer of Steps: 4-5 Entrance Stairs-Rails: [place]: One level     Bathroom Shower/Tub: Teacher, early years/pre: Handicapped height     [place]: Grab bars - toilet;Cane - Holiday representative (2 wheels)          Prior Functioning/Environment Prior Level of Function : Independent/Modified Independent;Driving;History of Falls (last six months)             Mobility Comments: Mod I - uses RW in the home, quad cane in community. Pt endorsed ~2 falls past 6 month (gets dizzy and falls). ADLs Comments: Mod I for ADLs/IADLs, shared responsibility with SO for household chores, pt still drives.        OT Problem List:        OT Treatment/Interventions:      OT Goals(Current goals can be found in the care plan section) Acute Rehab OT Goals Patient Stated Goal: return home OT Goal Formulation: All assessment and education complete, DC therapy  OT Frequency:      Co-evaluation              AM-PAC OT "6 Clicks" Daily Activity     Outcome Measure Help from another person eating meals?: None Help from another person taking care of personal grooming?: None Help from another person toileting, which includes using toliet, bedpan, or urinal?: None Help from another person bathing (including washing, rinsing, drying)?: None Help from another person to put on and taking off regular upper body clothing?: None Help from another person to put on and taking off regular lower body clothing?: None 6 Click Score: 24   End of Session Equipment Utilized During Treatment: Gait belt;Rolling walker (2 wheels) Nurse Communication: Mobility  status  Activity Tolerance: Patient tolerated treatment well Patient left: in bed;with call bell/phone within reach;with bed alarm set  OT Visit Diagnosis: Other abnormalities of gait and mobility (R26.89)                Time: [id number] OT Time Calculation (min): 20 min Charges:  OT General Charges $OT Visit: 1 Visit OT Evaluation $OT Eval Low Complexity: 1 Low  [facility] MS, OTR/L ascom [phone]  03/09/22, 12:16 PM

## 2022-03-09 NOTE — Progress Notes (Signed)
Patient discharged home via wheelchair with spouse.

## 2022-03-09 NOTE — Inpatient Diabetes Management (Signed)
Inpatient Diabetes Program Recommendations  AACE/ADA: New Consensus Statement on Inpatient Glycemic Control   Target Ranges:  Prepandial:   less than 140 mg/dL      Peak postprandial:   less than 180 mg/dL (1-2 hours)      Critically ill patients:  140 - 180 mg/dL    Latest Reference Range & Units 03/09/22 00:07 03/09/22 04:06 03/09/22 07:40  Glucose-Capillary 70 - 99 mg/dL 187 (H) 179 (H) 125 (H)    Latest Reference Range & Units 03/08/22 05:57 03/08/22 08:12 03/08/22 12:20 03/08/22 16:20 03/08/22 20:51  Glucose-Capillary 70 - 99 mg/dL 77 187 (H)  Novolog 2 units 76 186 (H)  Novolog 2 units 217 (H)  Novolog 2 units    Latest Reference Range & Units 03/08/22 05:29  Hemoglobin A1C 4.8 - 5.6 % 8.0 (H)   Review of Glycemic Control  Diabetes history: DM2 Outpatient Diabetes medications: Lantus 36 units QHS, Januvia 100 mg daily (not taking), Farxiga 10 mg daily, Novolog 6-8 units with meals Current orders for Inpatient glycemic control: Novolog 0-9 units TID with meals, Novolog 0-5 units QHS   Inpatient Diabetes Program Recommendations:     HbgA1C: A1C 8.0% on 03/08/22 indicating an average glucose of 183 mg/dl.  Outpatient DM: At time of discharge, may want to consider decreasing Lantus to 5 units daily, continuing Januvia 100 mg daily, and stopping Wilder Glade (due to UTI) and Novolog with meals. Ask patient to reach out to Birdena Crandall, CPP [phone]) to make an appointment as soon as possible for follow up and assistance with DM management.  Note: Patient admitted on 03/07/22 with altered mental status, hypoglycemia, and UTI. Since arrival to hospital glucose has ranged from 40-217 mg/dl and patient has only received a total of Novolog 6 units for correction.   Thanks, Barnie Alderman, RN, MSN, [facility] Coordinator Inpatient Diabetes Program [phone] (Team Pager from 8am to [place])

## 2022-03-09 NOTE — Progress Notes (Signed)
PROGRESS NOTE    Lacey Gonzalez  [id number] DOB: December 24, 1955 DOA: 03/07/2022 PCP: Baxter Hire, MD   Brief Narrative:  67 year old with history of HTN, depression, insulin-dependent DM2, neuropathy, GERD, history of CVA with right-sided deficit comes with altered mental status.  She was found to be hypoglycemic.  Patient was treated with D10 which improved her blood glucose and her mentation.  There was some confusion with patient's medication that she was taking outpatient but eventually it was determined that she will be on Lantus 5 units daily, Januvia 100 mg daily, NovoLog premeals and stopping [place].   Assessment & Plan:  Principal Problem:   Altered mental status Active Problems:   DM2 (diabetes mellitus, type 2) (HCC)   CVA (cerebral vascular accident) ([place])   Primary hypertension   Anxiety and depression   UTI (urinary tract infection)   Malnutrition of moderate degree   Hypoglycemia associated with diabetes ([place])   Closed compression fracture of L1 vertebra (HCC)   Unintentional weight loss     Assessment and Plan: * Altered mental status secondary to hypoglycemia and possible urinary tract infection - CT head is negative.  With hypoglycemia her mentation is improved  UTI (urinary tract infection) -On IV Rocephin.  Cultures are growing Klebsiella, sensitivities to follow  DM2 (diabetes mellitus, type 2) (HCC) Hypoglycemia, improved - She is insulin-dependent.   A1c is 8.0.  Seen by diabetic coordinator.    Plan will be to discharge patient on Lantus 5 units daily, Januvia 100 mg daily, NovoLog Premeal and stop [place].  Hyperkalemia -No obvious EKG changes noted.  Lokelma given.  Recheck potassium  Essential hypertension - Norvasc 10 mg daily, Stop Avapro 150 mg daily while has elevated K - IV as needed  Anxiety and depression - Buspirone 5 mg p.o. twice daily, duloxetine 30 mg daily, escitalopram 10 mg daily  Unintentional weight loss - Counseled  that she needs to follow-up with her outpatient provider to ensure she is up-to-date with her screening.  Will consult dietitian to speak with the patient here  Closed compression fracture of L1 vertebra (HCC) - Pain control with bowel regimen   DVT prophylaxis: enoxaparin (LOVENOX) injection 40 mg Start: 03/07/22 2200 Place TED hose Start: 03/07/22 1431 Code Status: Full Code Family Communication:    Status is: Inpatient If Potassium better and works with PT, she may be able to discharge later today otherwise will stay for another day.    Subjective:  Doing better, no complaints.   Examination:  Constitutional: Not in acute distress Respiratory: Clear to auscultation bilaterally Cardiovascular: Normal sinus rhythm, no rubs Abdomen: Nontender nondistended good bowel sounds Musculoskeletal: No edema noted Skin: No rashes seen Neurologic: CN 2-12 grossly intact.  And nonfocal Psychiatric: Normal judgment and insight. Alert and oriented x 3. Normal mood.    Objective: Vitals:   03/09/22 0400 03/09/22 0500 03/09/22 0739 03/09/22 1139  BP: (!) 166/94  (!) 155/79 (!) 157/68  Pulse: 86 87 86 87  Resp: '18 16 18 19  '$ Temp: 97.6 F (36.4 C)  98.4 F (36.9 C) 98.1 F (36.7 C)  TempSrc: Oral  Oral Oral  SpO2: 98% 94% 100% 100%  Weight:      Height:        Intake/Output Summary (Last 24 hours) at 03/09/2022 1144 Last data filed at 03/09/2022 0900 Gross per 24 hour  Intake 1040 ml  Output 1000 ml  Net 40 ml   Filed Weights   03/07/22 1001  Weight:  59.4 kg     Data Reviewed:   CBC: Recent Labs  Lab 03/07/22 1012 03/08/22 0529 03/09/22 0336  WBC 7.5 9.0 7.6  NEUTROABS 5.6  --   --   HGB 11.4* 12.2 11.8*  HCT 37.6 38.7 36.9  MCV 87.4 83.6 84.4  PLT 245 338 [id number]   Basic Metabolic Panel: Recent Labs  Lab 03/07/22 1012 03/07/22 1254 03/08/22 0529 03/09/22 0336  NA 137  --  134* 133*  K 3.9  --  5.0 5.7*  CL 113*  --  105 105  CO2 17*  --  26 23  GLUCOSE 88   --  103* 204*  BUN 22  --  24* 31*  CREATININE 1.09*  --  1.38* 1.30*  CALCIUM 7.9*  --  9.2 8.9  MG  --  1.5* 2.4 2.1   GFR: Estimated Creatinine Clearance: 33.7 mL/min (A) (by C-G formula based on SCr of 1.3 mg/dL (H)). Liver Function Tests: Recent Labs  Lab 03/07/22 1012  AST 32  ALT 26  ALKPHOS 119  BILITOT 0.3  PROT 6.3*  ALBUMIN 3.1*   Recent Labs  Lab 03/07/22 1254  LIPASE 23   No results for input(s): "AMMONIA" in the last 168 hours. Coagulation Profile: Recent Labs  Lab 03/08/22 0529  INR 1.0   Cardiac Enzymes: No results for input(s): "CKTOTAL", "CKMB", "CKMBINDEX", "TROPONINI" in the last 168 hours. BNP (last 3 results) No results for input(s): "PROBNP" in the last 8760 hours. HbA1C: Recent Labs    03/08/22 0529  HGBA1C 8.0*   CBG: Recent Labs  Lab 03/08/22 2051 03/09/22 0007 03/09/22 0406 03/09/22 0740 03/09/22 1141  GLUCAP 217* 187* 179* 125* 239*   Lipid Profile: No results for input(s): "CHOL", "HDL", "LDLCALC", "TRIG", "CHOLHDL", "LDLDIRECT" in the last 72 hours. Thyroid Function Tests: No results for input(s): "TSH", "T4TOTAL", "FREET4", "T3FREE", "THYROIDAB" in the last 72 hours. Anemia Panel: No results for input(s): "VITAMINB12", "FOLATE", "FERRITIN", "TIBC", "IRON", "RETICCTPCT" in the last 72 hours. Sepsis Labs: Recent Labs  Lab 03/07/22 1012 03/07/22 1045 03/07/22 1254  PROCALCITON <0.10  --   --   LATICACIDVEN  --  1.5 2.6*    Recent Results (from the past 240 hour(s))  Culture, blood (routine x 2)     Status: None (Preliminary result)   Collection Time: 03/07/22 10:45 AM   Specimen: BLOOD  Result Value Ref Range Status   Specimen Description BLOOD BLOOD LEFT HAND  Final   Special Requests   Final    AEROBIC BOTTLE ONLY Blood Culture results may not be optimal due to an inadequate volume of blood received in culture bottles   Culture   Final    NO GROWTH 2 DAYS Performed at [facility], [address] [place], [place] [address]    Report Status PENDING  Incomplete  Culture, blood (routine x 2)     Status: None (Preliminary result)   Collection Time: 03/07/22 10:55 AM   Specimen: BLOOD  Result Value Ref Range Status   Specimen Description   Final    BLOOD RIGHT ANTECUBITAL Performed at [facility], [address] [place], [place] [address]    Special Requests   Final    BOTTLES DRAWN AEROBIC AND ANAEROBIC Blood Culture adequate volume Performed at [facility], [address], [place], [place] [address]    Culture  Setup Time   Final    GRAM POSITIVE COCCI ANAEROBIC BOTTLE ONLY CRITICAL RESULT CALLED TO, READ  BACK BY AND VERIFIED WITH: NATHAN BLUE'@0246'$  03/08/22 RH Performed at [place] [address], [place], [place] [address]    Culture GRAM POSITIVE COCCI  Final   Report Status PENDING  Incomplete  Blood Culture ID Panel (Reflexed)     Status: Abnormal   Collection Time: 03/07/22 10:55 AM  Result Value Ref Range Status   Enterococcus faecalis NOT DETECTED NOT DETECTED Final   Enterococcus Faecium NOT DETECTED NOT DETECTED Final   Listeria monocytogenes NOT DETECTED NOT DETECTED Final   Staphylococcus species DETECTED (A) NOT DETECTED Final    Comment: CRITICAL RESULT CALLED TO, READ BACK BY AND VERIFIED WITH: NATHAN BLUE'@0246'$  03/08/22 RH    Staphylococcus aureus (BCID) NOT DETECTED NOT DETECTED Final   Staphylococcus epidermidis NOT DETECTED NOT DETECTED Final   Staphylococcus lugdunensis NOT DETECTED NOT DETECTED Final   Streptococcus species NOT DETECTED NOT DETECTED Final   Streptococcus agalactiae NOT DETECTED NOT DETECTED Final   Streptococcus pneumoniae NOT DETECTED NOT DETECTED Final   Streptococcus pyogenes NOT DETECTED NOT DETECTED Final   A.calcoaceticus-baumannii NOT DETECTED NOT DETECTED Final   Bacteroides fragilis NOT DETECTED NOT DETECTED Final   Enterobacterales NOT DETECTED NOT DETECTED Final   Enterobacter cloacae complex  NOT DETECTED NOT DETECTED Final   Escherichia coli NOT DETECTED NOT DETECTED Final   Klebsiella aerogenes NOT DETECTED NOT DETECTED Final   Klebsiella oxytoca NOT DETECTED NOT DETECTED Final   Klebsiella pneumoniae NOT DETECTED NOT DETECTED Final   Proteus species NOT DETECTED NOT DETECTED Final   Salmonella species NOT DETECTED NOT DETECTED Final   Serratia marcescens NOT DETECTED NOT DETECTED Final   Haemophilus influenzae NOT DETECTED NOT DETECTED Final   Neisseria meningitidis NOT DETECTED NOT DETECTED Final   Pseudomonas aeruginosa NOT DETECTED NOT DETECTED Final   Stenotrophomonas maltophilia NOT DETECTED NOT DETECTED Final   Candida albicans NOT DETECTED NOT DETECTED Final   Candida auris NOT DETECTED NOT DETECTED Final   Candida glabrata NOT DETECTED NOT DETECTED Final   Candida krusei NOT DETECTED NOT DETECTED Final   Candida parapsilosis NOT DETECTED NOT DETECTED Final   Candida tropicalis NOT DETECTED NOT DETECTED Final   Cryptococcus neoformans/gattii NOT DETECTED NOT DETECTED Final    Comment: Performed at [facility], [address] [place], [place] [address]  Resp panel by RT-PCR (RSV, Flu A&B, Covid) Urine, Clean Catch     Status: None   Collection Time: 03/07/22  1:53 PM   Specimen: Urine, Clean Catch; Nasal Swab  Result Value Ref Range Status   SARS Coronavirus 2 by RT PCR NEGATIVE NEGATIVE Final    Comment: (NOTE) SARS-CoV-2 target nucleic acids are NOT DETECTED.  The SARS-CoV-2 RNA is generally detectable in upper respiratory specimens during the acute phase of infection. The lowest concentration of SARS-CoV-2 viral copies this assay can detect is 138 copies/mL. A negative result does not preclude SARS-Cov-2 infection and should not be used as the sole basis for treatment or other patient management decisions. A negative result may occur with  improper specimen collection/handling, submission of specimen other than nasopharyngeal swab, presence of  viral mutation(s) within the areas targeted by this assay, and inadequate number of viral copies(<138 copies/mL). A negative result must be combined with clinical observations, patient history, and epidemiological information. The expected result is Negative.  Fact Sheet for Patients:  EntrepreneurPulse.com.au  Fact Sheet for Healthcare Providers:  IncredibleEmployment.be  This test is no t yet approved or cleared by the [place] and  has been authorized for detection and/or diagnosis of SARS-CoV-2 by FDA under an Emergency Use Authorization (EUA). This EUA will remain  in effect (meaning this test can be used) for the duration of the COVID-19 declaration under Section 564(b)(1) of the Act, 21 U.S.C.section 360bbb-3(b)(1), unless the authorization is terminated  or revoked sooner.       Influenza A by PCR NEGATIVE NEGATIVE Final   Influenza B by PCR NEGATIVE NEGATIVE Final    Comment: (NOTE) The Xpert Xpress SARS-CoV-2/FLU/RSV plus assay is intended as an aid in the diagnosis of influenza from Nasopharyngeal swab specimens and should not be used as a sole basis for treatment. Nasal washings and aspirates are unacceptable for Xpert Xpress SARS-CoV-2/FLU/RSV testing.  Fact Sheet for Patients: EntrepreneurPulse.com.au  Fact Sheet for Healthcare Providers: IncredibleEmployment.be  This test is not yet approved or cleared by the [place] FDA and has been authorized for detection and/or diagnosis of SARS-CoV-2 by FDA under an Emergency Use Authorization (EUA). This EUA will remain in effect (meaning this test can be used) for the duration of the COVID-19 declaration under Section 564(b)(1) of the Act, 21 U.S.C. section 360bbb-3(b)(1), unless the authorization is terminated or revoked.     Resp Syncytial Virus by PCR NEGATIVE NEGATIVE Final    Comment: (NOTE) Fact Sheet for  Patients: EntrepreneurPulse.com.au  Fact Sheet for Healthcare Providers: IncredibleEmployment.be  This test is not yet approved or cleared by the [place] FDA and has been authorized for detection and/or diagnosis of SARS-CoV-2 by FDA under an Emergency Use Authorization (EUA). This EUA will remain in effect (meaning this test can be used) for the duration of the COVID-19 declaration under Section 564(b)(1) of the Act, 21 U.S.C. section 360bbb-3(b)(1), unless the authorization is terminated or revoked.  Performed at [facility], [address] [place], [place] [address]   Urine Culture (for pregnant, neutropenic or urologic patients or patients with an indwelling urinary catheter)     Status: Abnormal (Preliminary result)   Collection Time: 03/07/22  1:53 PM   Specimen: Urine, Random  Result Value Ref Range Status   Specimen Description   Final    URINE, RANDOM Performed at [facility], [address], [place], [place] [address]    Special Requests   Final    NONE Performed at [facility], [address] [place], [place] [address]    Culture (A)  Final    >=100,000 COLONIES/mL KLEBSIELLA OXYTOCA SUSCEPTIBILITIES TO FOLLOW Performed at [facility], [place] [address] [place], [place] [address]    Report Status PENDING  Incomplete         Radiology Studies: CT Head Wo Contrast  Result Date: 03/07/2022 CLINICAL DATA:  Mental status change, unknown cause. EXAM: CT HEAD WITHOUT CONTRAST TECHNIQUE: Contiguous axial images were obtained from the base of the skull through the vertex without intravenous contrast. RADIATION DOSE REDUCTION: This exam was performed according to the departmental dose-optimization program which includes automated exposure control, adjustment of the mA and/or kV according to patient size and/or use of iterative reconstruction technique. COMPARISON:  CT head and MRI brain  09/20/2020. FINDINGS: Brain: No acute intracranial hemorrhage. Unchanged mild chronic small-vessel disease. No mass effect or midline shift. No hydrocephalus or extra-axial collection. Vascular: No hyperdense vessel or unexpected calcification. Skull: No calvarial fracture or suspicious bone lesion. Skull base is unremarkable. Sinuses/Orbits: Unremarkable. Other: None. IMPRESSION: 1. No acute intracranial abnormality. 2. Unchanged mild chronic small-vessel disease. Electronically Signed   By:  Emmit Alexanders M.D.   On: 03/07/2022 13:44        Scheduled Meds:  amLODipine  10 mg Oral Daily   aspirin EC  81 mg Oral Daily   atorvastatin  40 mg Oral Daily   DULoxetine  30 mg Oral Daily   enoxaparin (LOVENOX) injection  40 mg Subcutaneous Q24H   escitalopram  10 mg Oral Daily   feeding supplement (GLUCERNA SHAKE)  237 mL Oral TID BM   gabapentin  300 mg Oral Daily   insulin aspart  0-5 Units Subcutaneous QHS   insulin aspart  0-9 Units Subcutaneous TID WC   meloxicam  15 mg Oral Daily   multivitamin with minerals  1 tablet Oral Daily   oxybutynin  5 mg Oral Daily   pantoprazole  40 mg Oral Daily   Continuous Infusions:  cefTRIAXone (ROCEPHIN)  IV 1 g (03/09/22 0807)     LOS: 2 days   Time spent= 35 mins    Kincade Granberg Arsenio Loader, MD [facility]  If 7PM-7AM, please contact night-coverage  03/09/2022, 11:44 AM

## 2022-03-09 NOTE — Progress Notes (Signed)
Discharge teaching complete. Meds, diet, activity, labs, follow up appointments reviewed and all questions answered. Copy of instructions given to patient and prescriptions sent to pharmacy. Patient waiting on spouse to get off work around 1730 to come pick her up.

## 2022-03-09 NOTE — Evaluation (Signed)
Physical Therapy Evaluation Patient Details Name: Lacey Gonzalez MRN: [id number] DOB: 30-Jul-1955 Today's Date: 03/09/2022  History of Present Illness  Lacey Gonzalez is a 67 year old female with history of hypertension, depression, hyperlipidemia, insulin-dependent diabetes mellitus, neuropathy, GERD, history of CVA with right-sided deficits, who presents emergency department for chief concerns of altered mental status. Per ED report, when EMS arrived, patient was found to be hypoglycemic with random blood glucose testing at 52.  Per report, patient was not responsive, when EMS arrived, patient was snoring, clammy, and hypoglycemic. CT head negative for acute intracranial abnormality.   Clinical Impression  Patient received in bed, she is agreeable to PT assessment. She is mod I with bed mobility and transfers. Ambulated 120 feet with RW and min guard. Has right sided weakness due to prior CVA but reports she feels at her baseline. Patient will continue to benefit from skilled PT while here to improve strength and safety with mobility.        Recommendations for follow up therapy are one component of a multi-disciplinary discharge planning process, led by the attending physician.  Recommendations may be updated based on patient status, additional functional criteria and insurance authorization.  Follow Up Recommendations Home health PT      Assistance Recommended at Discharge PRN  Patient can return home with the following  Help with stairs or ramp for entrance    Equipment Recommendations None recommended by PT  Recommendations for Other Services       Functional Status Assessment Patient has had a recent decline in their functional status and demonstrates the ability to make significant improvements in function in a reasonable and predictable amount of time.     Precautions / Restrictions Precautions Precautions: Fall Restrictions Weight Bearing Restrictions: No      Mobility   Bed Mobility Overal bed mobility: Modified Independent                  Transfers Overall transfer level: Modified independent Equipment used: Rolling walker (2 wheels)               General transfer comment: STS from EOB    Ambulation/Gait Ambulation/Gait assistance: Min guard Gait Distance (Feet): 120 Feet Assistive device: Rolling walker (2 wheels) Gait Pattern/deviations: Step-through pattern, Decreased dorsiflexion - right, Decreased weight shift to right Gait velocity: decr     General Gait Details: patient has weakness on Right side due to prior CVA. At baseline  Stairs            Wheelchair Mobility    Modified Rankin (Stroke Patients Only)       Balance Overall balance assessment: Mild deficits observed, not formally tested, History of Falls                                           Pertinent Vitals/Pain Pain Assessment Pain Assessment: No/denies pain    Home Living Family/patient expects to be discharged to:: Private residence Living Arrangements: Spouse/significant other Available Help at Discharge: Family;Available PRN/intermittently Type of Home: House Home Access: Stairs to enter Entrance Stairs-Rails: Psychiatric nurse of Steps: 4-5   Home Layout: One level Home Equipment: Grab bars - toilet;Cane - quad;Rolling Walker (2 wheels)      Prior Function Prior Level of Function : Independent/Modified Independent;Driving;History of Falls (last six months)  Mobility Comments: Mod I - uses RW in the home, quad cane in community. Pt endorsed ~2 falls past 6 month (gets dizzy and falls). ADLs Comments: Mod I for ADLs/IADLs, shared responsibility with SO for household chores, pt still drives.     Hand Dominance   Dominant Hand: Right    Extremity/Trunk Assessment   Upper Extremity Assessment Upper Extremity Assessment: Defer to OT evaluation RUE Deficits / Details: previous CVA,  shoulder flexion limited to ~110 deg, thump opposition impaired, poor grip strength RUE Coordination: decreased fine motor    Lower Extremity Assessment Lower Extremity Assessment: RLE deficits/detail RLE Deficits / Details: prior CVA, RIght side weak. RLE Coordination: decreased gross motor       Communication   Communication: No difficulties  Cognition Arousal/Alertness: Awake/alert Behavior During Therapy: WFL for tasks assessed/performed Overall Cognitive Status: Within Functional Limits for tasks assessed                                 General Comments: A&Ox4, followed commands well        General Comments      Exercises     Assessment/Plan    PT Assessment Patient needs continued PT services  PT Problem List Decreased strength;Decreased coordination;Decreased balance;Decreased mobility       PT Treatment Interventions DME instruction;Gait training;Stair training;Functional mobility training;Therapeutic activities;Patient/family education;Balance training;Therapeutic exercise    PT Goals (Current goals can be found in the Care Plan section)  Acute Rehab PT Goals Patient Stated Goal: to return home, get blood sugar straightened out PT Goal Formulation: With patient Time For Goal Achievement: 03/16/22 Potential to Achieve Goals: Good    Frequency Min 2X/week     Co-evaluation               AM-PAC PT "6 Clicks" Mobility  Outcome Measure Help needed turning from your back to your side while in a flat bed without using bedrails?: None Help needed moving from lying on your back to sitting on the side of a flat bed without using bedrails?: None Help needed moving to and from a bed to a chair (including a wheelchair)?: A Little Help needed standing up from a chair using your arms (e.g., wheelchair or bedside chair)?: A Little Help needed to walk in hospital room?: A Little Help needed climbing 3-5 steps with a railing? : A Little 6 Click  Score: 20    End of Session Equipment Utilized During Treatment: Gait belt Activity Tolerance: Patient tolerated treatment well Patient left: in bed;with call bell/phone within reach;with bed alarm set Nurse Communication: Mobility status PT Visit Diagnosis: Other abnormalities of gait and mobility (R26.89);Muscle weakness (generalized) (M62.81);History of falling (Z91.81);Difficulty in walking, not elsewhere classified (R26.2)    Time: [id number] PT Time Calculation (min) (ACUTE ONLY): 8 min   Charges:   PT Evaluation $PT Eval Low Complexity: 1 Low          Ailene Royal, PT, GCS 03/09/22,1:09 PM

## 2022-03-10 ENCOUNTER — Ambulatory Visit: Payer: Medicare HMO | Admitting: Orthopedic Surgery

## 2022-03-10 LAB — CULTURE, BLOOD (ROUTINE X 2): Special Requests: ADEQUATE

## 2022-03-10 LAB — URINE CULTURE: Culture: >=100000 — AB

## 2022-03-12 LAB — CULTURE, BLOOD (ROUTINE X 2): Culture: NO GROWTH

## 2022-03-14 NOTE — Progress Notes (Unsigned)
   Telephone Visit- Progress Note: Referring Physician:  Baxter Hire, MD [place],  [place] [address]  Primary Physician:  Baxter Hire, MD  This visit was performed via telephone.  Patient location: home Provider location: office  I spent a total of 15 minutes non-face-to-face activities for this visit on the date of this encounter including review of current clinical condition and response to treatment.    Patient has given verbal consent to this telephone visits and we reviewed the limitations of a telephone visit. Patient wishes to proceed.    Chief Complaint:  follow up to review xrays  History of Present Illness: Lacey Gonzalez is a 67 y.o. female has a history of history of CVA with right sided weakness, DM, HTN.   Recently in hospital for acute cystitis and hypoglycemia.  Last seen by me on 02/28/22 for  L1 compression fracture s/p fall on 02/15/22. She has right sided LBP with no leg pain.    She has chronic right sided weakness and balance issues since CVA. Has dexterity issues due to weakness in right hand.   Phone visit scheduled to review her lumbar xrays.   She is doing much better since her last visit! She has right sided LBP that is now intermittent (was constant at last visit). She has no leg pain. No numbness, tingling, or weakness.  Exam: No exam done as this was a telephone encounter.     Imaging: Lumbar xrays dated 02/28/22:  FINDINGS: Increased compression fracture of L1 with now greater than 75% vertebral body height loss. Suggestion of increased retropulsion of the superior endplate of L1 though this is not optimally evaluated radiographically. Remainder unchanged. No additional fracture. Loss of disc space height at L1-L2 is moderate. Moderate lower lumbar facet arthropathy.   IMPRESSION: Increased compression fracture of L1 compared with 02/15/2022, with now greater than 75% vertebral body height loss. Possible  increased retropulsion of the superior endplate.     Electronically Signed   By: Lacey Gonzalez M.D.   On: 03/01/2022 20:48  I have personally reviewed the images and agree with the above interpretation.  Above xrays reviewed with Dr. Izora Gonzalez as well.    Assessment and Plan: Lacey Gonzalez is a pleasant 67 y.o. female has L1 compression fracture s/p fall on 02/15/22. She has seen improvement in her back pain since her last visit. She has intermittent right sided LBP. No leg pain. No numbness, tingling, or weakness.     She has chronic right sided weakness and balance issues since CVA. Has dexterity issues due to weakness in right hand.   As above, she had some progression of L1 compression fracture.    Treatment options discussed with patient and following plan made:    - We reviewed her xrays showing progression of fracture and discussed possible kyphoplasty. As her pain is improving, she declines this for now. Can revisit if needed. Would need MRI prior to referral to IR.  - Continue with TLSO brace. Do not wear to sleep. Can remove if watching TV. - No bending, twisting, or lifting.  - Follow up in 6 weeks with repeat lumbar xrays.   Geronimo Boot PA-C Neurosurgery

## 2022-03-15 DIAGNOSIS — I10 Essential (primary) hypertension: Secondary | ICD-10-CM | POA: Diagnosis not present

## 2022-03-15 DIAGNOSIS — E1169 Type 2 diabetes mellitus with other specified complication: Secondary | ICD-10-CM | POA: Diagnosis not present

## 2022-03-15 DIAGNOSIS — E785 Hyperlipidemia, unspecified: Secondary | ICD-10-CM | POA: Diagnosis not present

## 2022-03-15 DIAGNOSIS — N390 Urinary tract infection, site not specified: Secondary | ICD-10-CM | POA: Diagnosis not present

## 2022-03-15 DIAGNOSIS — Z794 Long term (current) use of insulin: Secondary | ICD-10-CM | POA: Diagnosis not present

## 2022-03-15 DIAGNOSIS — Z09 Encounter for follow-up examination after completed treatment for conditions other than malignant neoplasm: Secondary | ICD-10-CM | POA: Diagnosis not present

## 2022-03-16 ENCOUNTER — Encounter: Payer: Self-pay | Admitting: Orthopedic Surgery

## 2022-03-16 ENCOUNTER — Ambulatory Visit (INDEPENDENT_AMBULATORY_CARE_PROVIDER_SITE_OTHER): Payer: Medicare HMO | Admitting: Orthopedic Surgery

## 2022-03-16 DIAGNOSIS — S32010D Wedge compression fracture of first lumbar vertebra, subsequent encounter for fracture with routine healing: Secondary | ICD-10-CM

## 2022-03-16 DIAGNOSIS — W19XXXD Unspecified fall, subsequent encounter: Secondary | ICD-10-CM | POA: Diagnosis not present

## 2022-04-20 ENCOUNTER — Ambulatory Visit: Payer: Medicare HMO | Admitting: Orthopedic Surgery

## 2022-05-09 DIAGNOSIS — K5909 Other constipation: Secondary | ICD-10-CM | POA: Diagnosis not present

## 2022-05-09 DIAGNOSIS — R195 Other fecal abnormalities: Secondary | ICD-10-CM | POA: Diagnosis not present

## 2022-05-25 ENCOUNTER — Other Ambulatory Visit: Payer: Self-pay

## 2022-05-25 ENCOUNTER — Emergency Department
Admission: EM | Admit: 2022-05-25 | Discharge: 2022-05-25 | Disposition: A | Discharge status: Home / Self Care | Payer: Medicare HMO | Attending: Emergency Medicine | Admitting: Emergency Medicine

## 2022-05-25 ENCOUNTER — Encounter: Payer: Self-pay | Admitting: Emergency Medicine

## 2022-05-25 DIAGNOSIS — E1169 Type 2 diabetes mellitus with other specified complication: Secondary | ICD-10-CM | POA: Diagnosis not present

## 2022-05-25 DIAGNOSIS — Z794 Long term (current) use of insulin: Secondary | ICD-10-CM | POA: Diagnosis not present

## 2022-05-25 DIAGNOSIS — I10 Essential (primary) hypertension: Secondary | ICD-10-CM | POA: Diagnosis not present

## 2022-05-25 DIAGNOSIS — E119 Type 2 diabetes mellitus without complications: Secondary | ICD-10-CM | POA: Diagnosis not present

## 2022-05-25 DIAGNOSIS — R110 Nausea: Secondary | ICD-10-CM | POA: Diagnosis not present

## 2022-05-25 DIAGNOSIS — Z8673 Personal history of transient ischemic attack (TIA), and cerebral infarction without residual deficits: Secondary | ICD-10-CM | POA: Diagnosis not present

## 2022-05-25 LAB — BASIC METABOLIC PANEL
Anion gap: 8 (ref 5–15)
BUN: 21 mg/dL (ref 8–23)
CO2: 21 mmol/L — ABNORMAL LOW (ref 22–32)
Calcium: 9.3 mg/dL (ref 8.9–10.3)
Chloride: 104 mmol/L (ref 98–111)
Creatinine, Ser: 1.22 mg/dL — ABNORMAL HIGH (ref 0.44–1.00)
GFR, Estimated: 49 mL/min — ABNORMAL LOW (ref >60)
Glucose, Bld: 133 mg/dL — ABNORMAL HIGH (ref 70–99)
Potassium: 4.5 mmol/L (ref 3.5–5.1)
Sodium: 133 mmol/L — ABNORMAL LOW (ref 135–145)

## 2022-05-25 LAB — CBC WITH DIFFERENTIAL/PLATELET
Abs Immature Granulocytes: 0.03 10*3/uL (ref 0.00–0.07)
Basophils Absolute: 0.1 10*3/uL (ref 0.0–0.1)
Basophils Relative: 1 %
Eosinophils Absolute: 0.1 10*3/uL (ref 0.0–0.5)
Eosinophils Relative: 1 %
HCT: 35.3 % — ABNORMAL LOW (ref 36.0–46.0)
Hemoglobin: 11.7 g/dL — ABNORMAL LOW (ref 12.0–15.0)
Immature Granulocytes: 0 %
Lymphocytes Relative: 28 %
Lymphs Abs: 2.4 10*3/uL (ref 0.7–4.0)
MCH: 28.1 pg (ref 26.0–34.0)
MCHC: 33.1 g/dL (ref 30.0–36.0)
MCV: 84.9 fL (ref 80.0–100.0)
Monocytes Absolute: 0.4 10*3/uL (ref 0.1–1.0)
Monocytes Relative: 5 %
Neutro Abs: 5.6 10*3/uL (ref 1.7–7.7)
Neutrophils Relative %: 65 %
Platelets: 458 10*3/uL — ABNORMAL HIGH (ref 150–400)
RBC: 4.16 MIL/uL (ref 3.87–5.11)
RDW: 13.7 % (ref 11.5–15.5)
WBC: 8.7 10*3/uL (ref 4.0–10.5)
nRBC: 0 % (ref 0.0–0.2)

## 2022-05-25 NOTE — ED Provider Notes (Signed)
[facility] Provider Note   Event Date/Time   First MD Initiated Contact with Patient 05/25/22 1555     (approximate) History  Hypertension  HPI DIEM Lacey Gonzalez is a 67 y.o. female with a stated past medical history of hypertension who presents from her primary care physician's office for her high blood pressure.  Patient states that she had a reading of 200s and systolics after not taking her medication this morning due to nausea.  Patient was sent here from the PCPs office with concerns for this hypertension.  Patient is still not taking her prescribed hypertension medication ROS: Patient currently denies any vision changes, tinnitus, difficulty speaking, facial droop, sore throat, chest pain, shortness of breath, abdominal pain, nausea/vomiting/diarrhea, dysuria, or weakness/numbness/paresthesias in any extremity   Physical Exam  Triage Vital Signs: ED Triage Vitals [05/25/22 1357]  Enc Vitals Group     BP (!) 176/90     Pulse Rate 92     Resp 18     Temp 98.5 F (36.9 C)     Temp Source Oral     SpO2 95 %     Weight      Height      Head Circumference      Peak Flow      Pain Score 4     Pain Loc      Pain Edu?      Excl. in GC?    Most recent vital signs: Vitals:   05/25/22 1357  BP: (!) 176/90  Pulse: 92  Resp: 18  Temp: 98.5 F (36.9 C)  SpO2: 95%   General: Awake, oriented x4. CV:  Good peripheral perfusion.  Resp:  Normal effort.  Abd:  No distention.  Other:  Well-developed, well-nourished middle-aged Caucasian female laying in bed in no acute distress ED Results / Procedures / Treatments  Labs (all labs ordered are listed, but only abnormal results are displayed) Labs Reviewed  CBC WITH DIFFERENTIAL/PLATELET - Abnormal; Notable for the following components:      Result Value   Hemoglobin 11.7 (*)    HCT 35.3 (*)    Platelets 458 (*)    All other components within normal limits  BASIC METABOLIC PANEL - Abnormal; Notable for  the following components:   Sodium 133 (*)    CO2 21 (*)    Glucose, Bld 133 (*)    Creatinine, Ser 1.22 (*)    GFR, Estimated 49 (*)    All other components within normal limits   PROCEDURES: Critical Care performed: No Procedures MEDICATIONS ORDERED IN ED: Medications - No data to display IMPRESSION / MDM / ASSESSMENT AND PLAN / ED COURSE  I reviewed the triage vital signs and the nursing notes.                             Patient's presentation is most consistent with acute presentation with potential threat to life or bodily function. Presents to the emergency department complaining of high blood pressure. Patient is otherwise asymptomatic without confusion, chest pain, hematuria, or SOB. Endorses nonadherence to antihypertensive regimen DDx: CV, AMI, heart failure, renal infarction or failure or other end organ damage.  Disposition: Discussed with patient their elevated blood pressure and need for close outpatient management of their hypertension. Will provide a prescription for the patient's previous antihypertensive medication and arrange for the patient to follow up in a primary care clinic  FINAL CLINICAL IMPRESSION(S) / ED DIAGNOSES   Final diagnoses:  Uncontrolled hypertension   Rx / DC Orders   ED Discharge Orders     None      Note:  This document was prepared using Dragon voice recognition software and may include unintentional dictation errors.   Merwyn Katos, MD 05/25/22 [phone]

## 2022-05-25 NOTE — ED Triage Notes (Signed)
Patient to ED via POV from PCP appointment for hypertension. Patient states at appointment BP was in the 200's hx of HTN and takes meds but did not take this AM due to nausea. Hx of stroke.

## 2022-05-25 NOTE — Discharge Instructions (Addendum)
Please take your olmesartan 20mg  dose tonight. If your blood pressure is still above 150 on top (systolic) AND above 90 on the bottom (diastolic) you may take another dose of your olmesartan 20mg  If in 2 days, you pressure is still high, please take 40mg  of olmesartan in the morning and contact your PCP for further medication management

## 2022-06-21 DIAGNOSIS — E785 Hyperlipidemia, unspecified: Secondary | ICD-10-CM | POA: Diagnosis not present

## 2022-06-21 DIAGNOSIS — I10 Essential (primary) hypertension: Secondary | ICD-10-CM | POA: Diagnosis not present

## 2022-06-21 DIAGNOSIS — E1169 Type 2 diabetes mellitus with other specified complication: Secondary | ICD-10-CM | POA: Diagnosis not present

## 2022-06-21 DIAGNOSIS — Z794 Long term (current) use of insulin: Secondary | ICD-10-CM | POA: Diagnosis not present

## 2022-06-21 DIAGNOSIS — F330 Major depressive disorder, recurrent, mild: Secondary | ICD-10-CM | POA: Diagnosis not present

## 2022-06-22 DIAGNOSIS — I10 Essential (primary) hypertension: Secondary | ICD-10-CM | POA: Diagnosis not present

## 2022-07-19 DIAGNOSIS — E785 Hyperlipidemia, unspecified: Secondary | ICD-10-CM | POA: Diagnosis not present

## 2022-07-19 DIAGNOSIS — E1169 Type 2 diabetes mellitus with other specified complication: Secondary | ICD-10-CM | POA: Diagnosis not present

## 2022-07-19 DIAGNOSIS — I10 Essential (primary) hypertension: Secondary | ICD-10-CM | POA: Diagnosis not present

## 2022-07-19 DIAGNOSIS — F330 Major depressive disorder, recurrent, mild: Secondary | ICD-10-CM | POA: Diagnosis not present

## 2022-07-19 DIAGNOSIS — Z794 Long term (current) use of insulin: Secondary | ICD-10-CM | POA: Diagnosis not present

## 2022-08-05 DIAGNOSIS — I10 Essential (primary) hypertension: Secondary | ICD-10-CM | POA: Diagnosis not present

## 2022-08-05 DIAGNOSIS — R2681 Unsteadiness on feet: Secondary | ICD-10-CM | POA: Diagnosis not present

## 2022-08-08 ENCOUNTER — Ambulatory Visit: Admit: 2022-08-08 | Payer: Medicare HMO

## 2022-08-08 SURGERY — COLONOSCOPY WITH PROPOFOL
Anesthesia: General

## 2022-08-15 DIAGNOSIS — E119 Type 2 diabetes mellitus without complications: Secondary | ICD-10-CM | POA: Diagnosis not present

## 2022-08-15 DIAGNOSIS — Z8719 Personal history of other diseases of the digestive system: Secondary | ICD-10-CM | POA: Diagnosis not present

## 2022-08-15 DIAGNOSIS — Z794 Long term (current) use of insulin: Secondary | ICD-10-CM | POA: Diagnosis not present

## 2022-08-15 DIAGNOSIS — I10 Essential (primary) hypertension: Secondary | ICD-10-CM | POA: Diagnosis not present

## 2022-08-15 DIAGNOSIS — I69351 Hemiplegia and hemiparesis following cerebral infarction affecting right dominant side: Secondary | ICD-10-CM | POA: Diagnosis not present

## 2022-08-15 DIAGNOSIS — Z7982 Long term (current) use of aspirin: Secondary | ICD-10-CM | POA: Diagnosis not present

## 2022-08-15 DIAGNOSIS — Z9181 History of falling: Secondary | ICD-10-CM | POA: Diagnosis not present

## 2022-08-23 DIAGNOSIS — Z7982 Long term (current) use of aspirin: Secondary | ICD-10-CM | POA: Diagnosis not present

## 2022-08-23 DIAGNOSIS — I10 Essential (primary) hypertension: Secondary | ICD-10-CM | POA: Diagnosis not present

## 2022-08-23 DIAGNOSIS — Z8719 Personal history of other diseases of the digestive system: Secondary | ICD-10-CM | POA: Diagnosis not present

## 2022-08-23 DIAGNOSIS — E119 Type 2 diabetes mellitus without complications: Secondary | ICD-10-CM | POA: Diagnosis not present

## 2022-08-23 DIAGNOSIS — Z794 Long term (current) use of insulin: Secondary | ICD-10-CM | POA: Diagnosis not present

## 2022-08-23 DIAGNOSIS — Z9181 History of falling: Secondary | ICD-10-CM | POA: Diagnosis not present

## 2022-08-23 DIAGNOSIS — I69351 Hemiplegia and hemiparesis following cerebral infarction affecting right dominant side: Secondary | ICD-10-CM | POA: Diagnosis not present

## 2022-08-30 DIAGNOSIS — I69351 Hemiplegia and hemiparesis following cerebral infarction affecting right dominant side: Secondary | ICD-10-CM | POA: Diagnosis not present

## 2022-08-30 DIAGNOSIS — E119 Type 2 diabetes mellitus without complications: Secondary | ICD-10-CM | POA: Diagnosis not present

## 2022-08-30 DIAGNOSIS — Z7982 Long term (current) use of aspirin: Secondary | ICD-10-CM | POA: Diagnosis not present

## 2022-08-30 DIAGNOSIS — Z794 Long term (current) use of insulin: Secondary | ICD-10-CM | POA: Diagnosis not present

## 2022-08-30 DIAGNOSIS — Z8719 Personal history of other diseases of the digestive system: Secondary | ICD-10-CM | POA: Diagnosis not present

## 2022-08-30 DIAGNOSIS — I10 Essential (primary) hypertension: Secondary | ICD-10-CM | POA: Diagnosis not present

## 2022-08-30 DIAGNOSIS — Z9181 History of falling: Secondary | ICD-10-CM | POA: Diagnosis not present

## 2022-09-03 DIAGNOSIS — I10 Essential (primary) hypertension: Secondary | ICD-10-CM | POA: Diagnosis not present

## 2022-09-03 DIAGNOSIS — I69351 Hemiplegia and hemiparesis following cerebral infarction affecting right dominant side: Secondary | ICD-10-CM | POA: Diagnosis not present

## 2022-09-03 DIAGNOSIS — E119 Type 2 diabetes mellitus without complications: Secondary | ICD-10-CM | POA: Diagnosis not present

## 2022-09-03 DIAGNOSIS — Z8719 Personal history of other diseases of the digestive system: Secondary | ICD-10-CM | POA: Diagnosis not present

## 2022-09-03 DIAGNOSIS — Z7982 Long term (current) use of aspirin: Secondary | ICD-10-CM | POA: Diagnosis not present

## 2022-09-03 DIAGNOSIS — Z9181 History of falling: Secondary | ICD-10-CM | POA: Diagnosis not present

## 2022-09-03 DIAGNOSIS — Z794 Long term (current) use of insulin: Secondary | ICD-10-CM | POA: Diagnosis not present

## 2022-09-10 DIAGNOSIS — Z9181 History of falling: Secondary | ICD-10-CM | POA: Diagnosis not present

## 2022-09-10 DIAGNOSIS — Z7982 Long term (current) use of aspirin: Secondary | ICD-10-CM | POA: Diagnosis not present

## 2022-09-10 DIAGNOSIS — E119 Type 2 diabetes mellitus without complications: Secondary | ICD-10-CM | POA: Diagnosis not present

## 2022-09-10 DIAGNOSIS — I10 Essential (primary) hypertension: Secondary | ICD-10-CM | POA: Diagnosis not present

## 2022-09-10 DIAGNOSIS — Z8719 Personal history of other diseases of the digestive system: Secondary | ICD-10-CM | POA: Diagnosis not present

## 2022-09-10 DIAGNOSIS — I69351 Hemiplegia and hemiparesis following cerebral infarction affecting right dominant side: Secondary | ICD-10-CM | POA: Diagnosis not present

## 2022-09-10 DIAGNOSIS — Z794 Long term (current) use of insulin: Secondary | ICD-10-CM | POA: Diagnosis not present

## 2022-09-17 DIAGNOSIS — Z8719 Personal history of other diseases of the digestive system: Secondary | ICD-10-CM | POA: Diagnosis not present

## 2022-09-17 DIAGNOSIS — I69351 Hemiplegia and hemiparesis following cerebral infarction affecting right dominant side: Secondary | ICD-10-CM | POA: Diagnosis not present

## 2022-09-17 DIAGNOSIS — E119 Type 2 diabetes mellitus without complications: Secondary | ICD-10-CM | POA: Diagnosis not present

## 2022-09-17 DIAGNOSIS — Z9181 History of falling: Secondary | ICD-10-CM | POA: Diagnosis not present

## 2022-09-17 DIAGNOSIS — I10 Essential (primary) hypertension: Secondary | ICD-10-CM | POA: Diagnosis not present

## 2022-09-17 DIAGNOSIS — Z794 Long term (current) use of insulin: Secondary | ICD-10-CM | POA: Diagnosis not present

## 2022-09-17 DIAGNOSIS — Z7982 Long term (current) use of aspirin: Secondary | ICD-10-CM | POA: Diagnosis not present

## 2022-09-20 DIAGNOSIS — Z8719 Personal history of other diseases of the digestive system: Secondary | ICD-10-CM | POA: Diagnosis not present

## 2022-09-20 DIAGNOSIS — I69351 Hemiplegia and hemiparesis following cerebral infarction affecting right dominant side: Secondary | ICD-10-CM | POA: Diagnosis not present

## 2022-09-20 DIAGNOSIS — E119 Type 2 diabetes mellitus without complications: Secondary | ICD-10-CM | POA: Diagnosis not present

## 2022-09-20 DIAGNOSIS — I10 Essential (primary) hypertension: Secondary | ICD-10-CM | POA: Diagnosis not present

## 2022-09-20 DIAGNOSIS — Z9181 History of falling: Secondary | ICD-10-CM | POA: Diagnosis not present

## 2022-09-20 DIAGNOSIS — Z794 Long term (current) use of insulin: Secondary | ICD-10-CM | POA: Diagnosis not present

## 2022-09-20 DIAGNOSIS — Z7982 Long term (current) use of aspirin: Secondary | ICD-10-CM | POA: Diagnosis not present

## 2022-09-21 DIAGNOSIS — F330 Major depressive disorder, recurrent, mild: Secondary | ICD-10-CM | POA: Diagnosis not present

## 2022-09-21 DIAGNOSIS — Z794 Long term (current) use of insulin: Secondary | ICD-10-CM | POA: Diagnosis not present

## 2022-09-21 DIAGNOSIS — Z0000 Encounter for general adult medical examination without abnormal findings: Secondary | ICD-10-CM | POA: Diagnosis not present

## 2022-09-21 DIAGNOSIS — Z1331 Encounter for screening for depression: Secondary | ICD-10-CM | POA: Diagnosis not present

## 2022-09-21 DIAGNOSIS — E119 Type 2 diabetes mellitus without complications: Secondary | ICD-10-CM | POA: Diagnosis not present

## 2022-09-21 DIAGNOSIS — M810 Age-related osteoporosis without current pathological fracture: Secondary | ICD-10-CM | POA: Diagnosis not present

## 2022-09-21 DIAGNOSIS — E1169 Type 2 diabetes mellitus with other specified complication: Secondary | ICD-10-CM | POA: Diagnosis not present

## 2022-09-21 DIAGNOSIS — E785 Hyperlipidemia, unspecified: Secondary | ICD-10-CM | POA: Diagnosis not present

## 2022-09-21 DIAGNOSIS — I10 Essential (primary) hypertension: Secondary | ICD-10-CM | POA: Diagnosis not present

## 2023-01-15 IMAGING — CT CT HEAD W/O CM
4 series · 17 of 47 positions shown, 19 images · non-contrast
Comparison: CT head 08/27/2016

CLINICAL DATA: Neuro deficit, acute stroke suspected.

EXAM:
CT HEAD WITHOUT CONTRAST
TECHNIQUE: Contiguous axial images were obtained from the base of the skull
through the vertex without intravenous contrast.

[Series 2: head wo · axial · 0.44mm/px · z∈[+365,+485]mm · 7 of 33 slices shown, 9 images]
[im 5/33  brain]
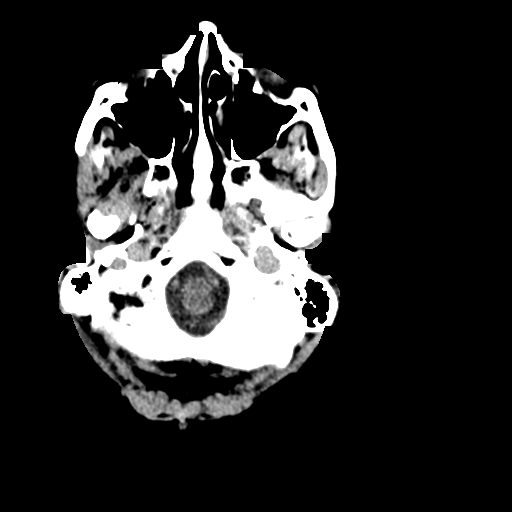
[im 5/33  bone]
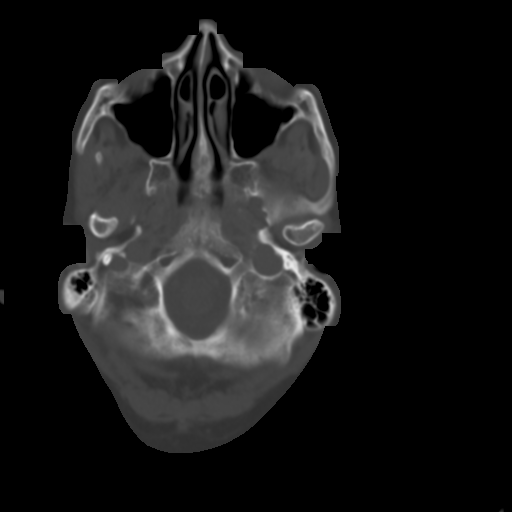
[im 9/33  brain]
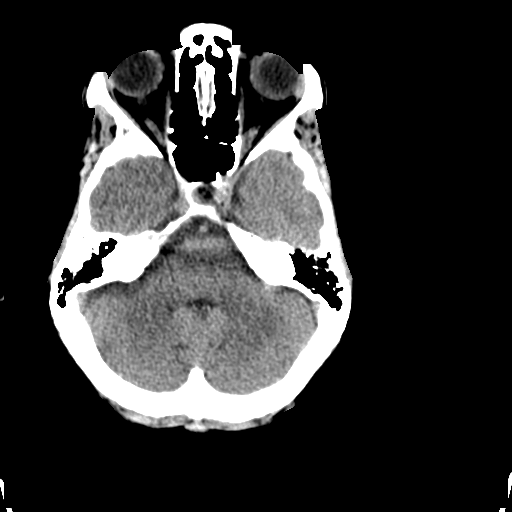
[im 13/33  brain]
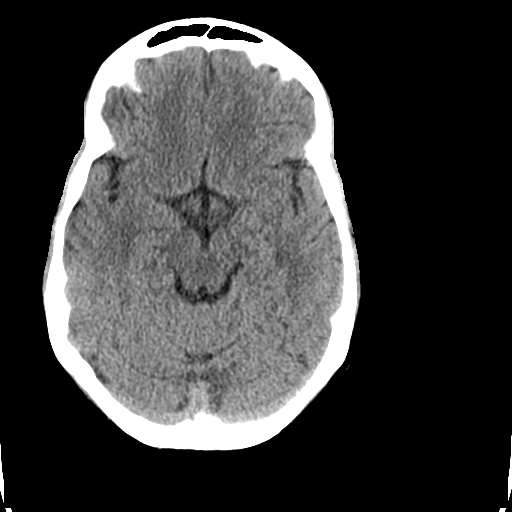
[im 17/33  brain]
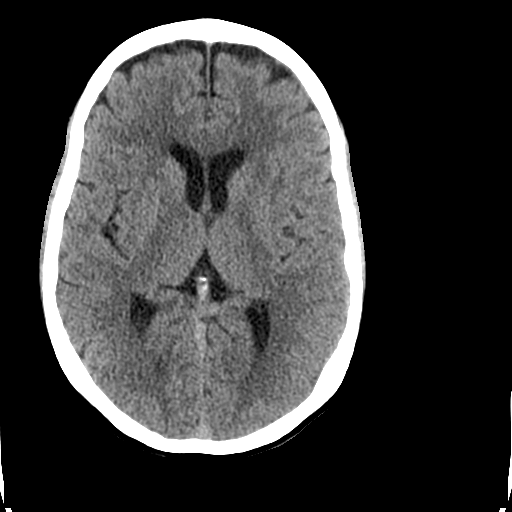
[im 21/33  brain]
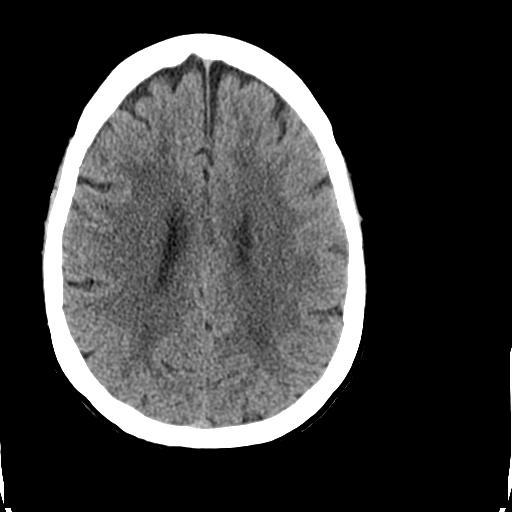
[im 21/33  bone]
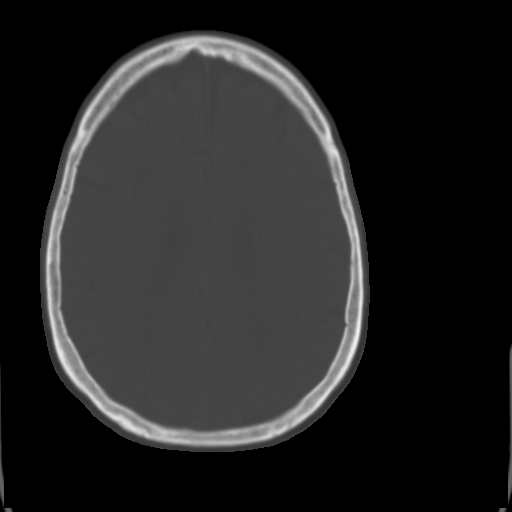
[im 25/33  brain]
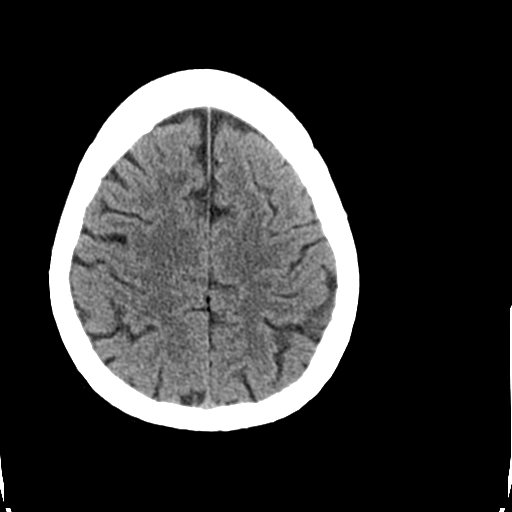
[im 29/33  brain]
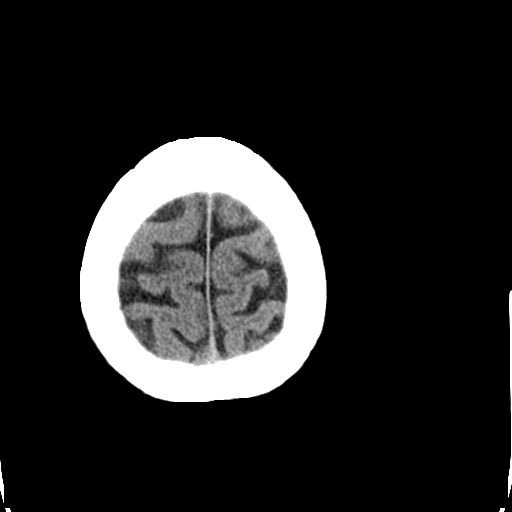

[Series 3: head bone · axial · 0.44mm/px · z∈[+361,+417]mm · 4 of 83 slices shown]
[im 9/83  bone]
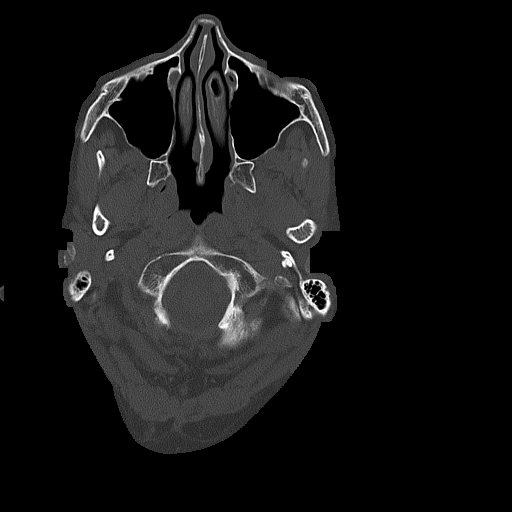
[im 17/83  bone]
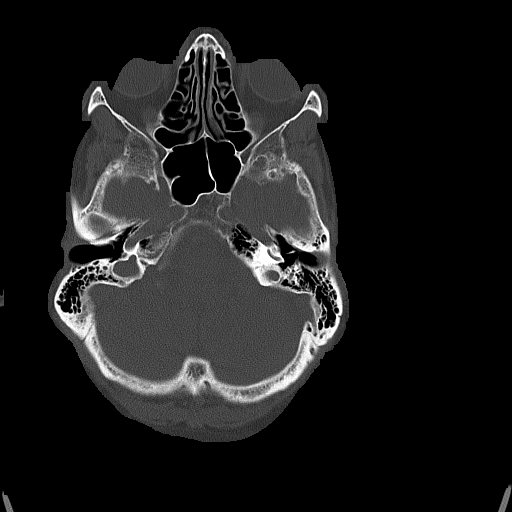
[im 25/83  bone]
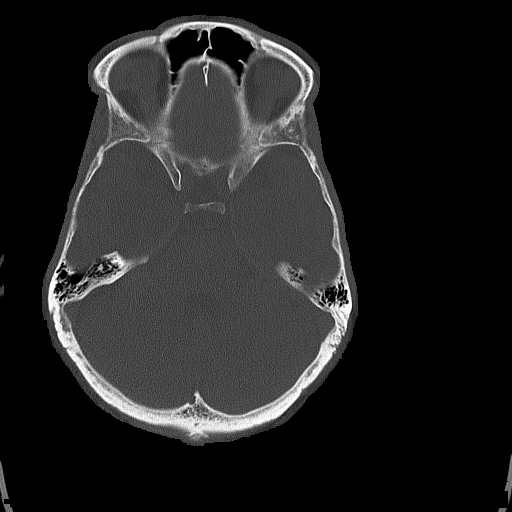
[im 37/83  bone]
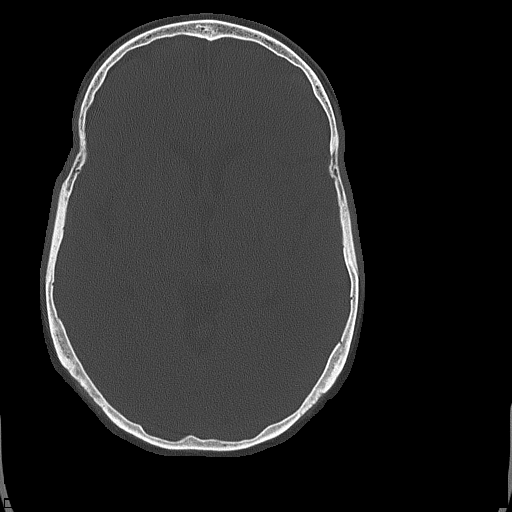

[Series 4: coronal soft tissue · coronal · 0.32mm/px · 3 of 68 slices shown]
[im 23/68  brain]
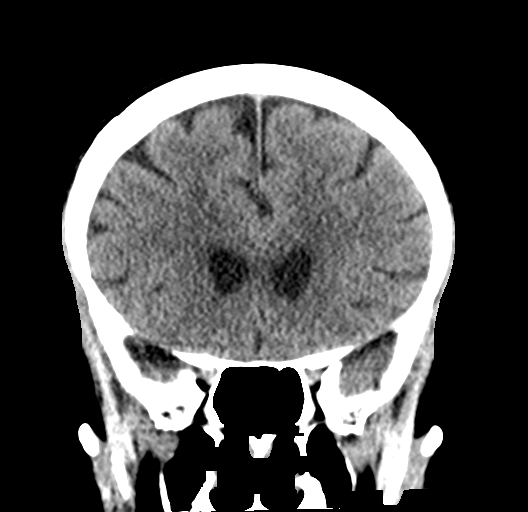
[im 30/68  brain]
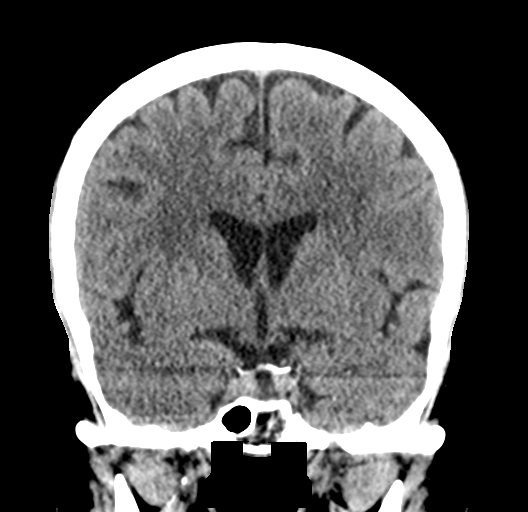
[im 38/68  brain]
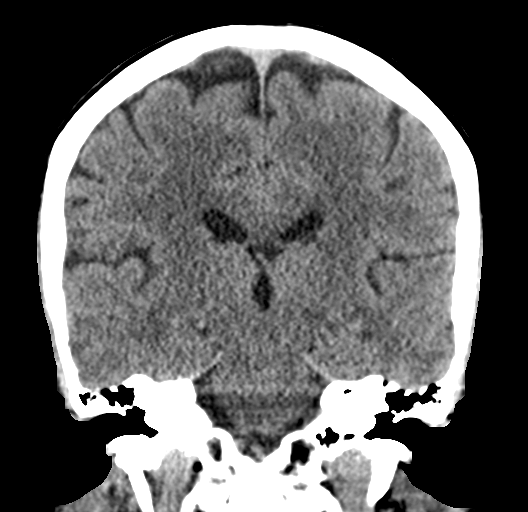

[Series 5: sagittal soft tissue · sagittal · 0.32mm/px · 3 of 52 slices shown]
[im 18/52  brain]
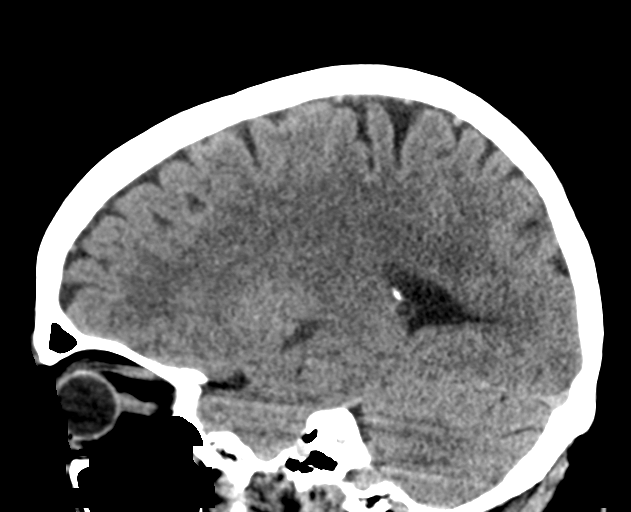
[im 26/52  brain]
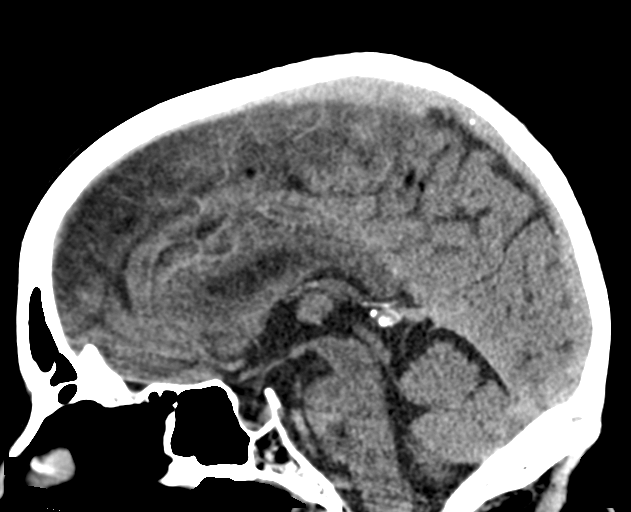
[im 35/52  brain]
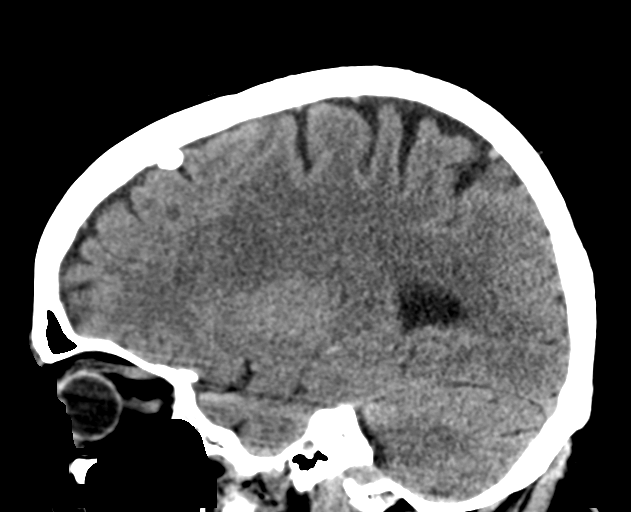

[17 of 47 positions shown; findings below may reference images not displayed]

BRAIN:
BRAIN
Cerebral ventricle sizes are concordant with the degree of cerebral
volume loss. Patchy and confluent areas of decreased attenuation are
noted throughout the deep and periventricular white matter of the
cerebral hemispheres bilaterally, compatible with chronic
microvascular ischemic disease.

No evidence of large-territorial acute infarction. No parenchymal
hemorrhage. No mass lesion. No extra-axial collection.

No mass effect or midline shift. No hydrocephalus. Basilar cisterns
are patent.

Vascular: No hyperdense vessel.

Skull: No acute fracture or focal lesion.

Sinuses/Orbits: Paranasal sinuses and mastoid air cells are clear.
The orbits are unremarkable.

Other: None.
IMPRESSION: No acute intracranial abnormality.

## 2023-01-26 ENCOUNTER — Other Ambulatory Visit: Payer: Self-pay | Admitting: Internal Medicine

## 2023-01-26 DIAGNOSIS — E119 Type 2 diabetes mellitus without complications: Secondary | ICD-10-CM | POA: Diagnosis not present

## 2023-01-26 DIAGNOSIS — Z794 Long term (current) use of insulin: Secondary | ICD-10-CM | POA: Diagnosis not present

## 2023-01-26 DIAGNOSIS — E785 Hyperlipidemia, unspecified: Secondary | ICD-10-CM | POA: Diagnosis not present

## 2023-01-26 DIAGNOSIS — E1169 Type 2 diabetes mellitus with other specified complication: Secondary | ICD-10-CM | POA: Diagnosis not present

## 2023-01-26 DIAGNOSIS — Z1231 Encounter for screening mammogram for malignant neoplasm of breast: Secondary | ICD-10-CM | POA: Diagnosis not present

## 2023-01-26 DIAGNOSIS — I10 Essential (primary) hypertension: Secondary | ICD-10-CM | POA: Diagnosis not present

## 2023-01-26 DIAGNOSIS — M810 Age-related osteoporosis without current pathological fracture: Secondary | ICD-10-CM | POA: Diagnosis not present

## 2023-01-26 DIAGNOSIS — F330 Major depressive disorder, recurrent, mild: Secondary | ICD-10-CM | POA: Diagnosis not present

## 2023-01-26 DIAGNOSIS — Z0001 Encounter for general adult medical examination with abnormal findings: Secondary | ICD-10-CM | POA: Diagnosis not present

## 2023-01-26 DIAGNOSIS — Z8673 Personal history of transient ischemic attack (TIA), and cerebral infarction without residual deficits: Secondary | ICD-10-CM | POA: Diagnosis not present

## 2023-05-02 DIAGNOSIS — K219 Gastro-esophageal reflux disease without esophagitis: Secondary | ICD-10-CM | POA: Diagnosis not present

## 2023-05-02 DIAGNOSIS — M810 Age-related osteoporosis without current pathological fracture: Secondary | ICD-10-CM | POA: Diagnosis not present

## 2023-05-02 DIAGNOSIS — Z794 Long term (current) use of insulin: Secondary | ICD-10-CM | POA: Diagnosis not present

## 2023-05-02 DIAGNOSIS — I10 Essential (primary) hypertension: Secondary | ICD-10-CM | POA: Diagnosis not present

## 2023-05-02 DIAGNOSIS — F330 Major depressive disorder, recurrent, mild: Secondary | ICD-10-CM | POA: Diagnosis not present

## 2023-05-02 DIAGNOSIS — E1169 Type 2 diabetes mellitus with other specified complication: Secondary | ICD-10-CM | POA: Diagnosis not present

## 2023-05-26 DIAGNOSIS — K219 Gastro-esophageal reflux disease without esophagitis: Secondary | ICD-10-CM | POA: Diagnosis not present

## 2023-05-26 DIAGNOSIS — Z794 Long term (current) use of insulin: Secondary | ICD-10-CM | POA: Diagnosis not present

## 2023-05-26 DIAGNOSIS — I10 Essential (primary) hypertension: Secondary | ICD-10-CM | POA: Diagnosis not present

## 2023-05-26 DIAGNOSIS — E1169 Type 2 diabetes mellitus with other specified complication: Secondary | ICD-10-CM | POA: Diagnosis not present

## 2023-05-26 DIAGNOSIS — E785 Hyperlipidemia, unspecified: Secondary | ICD-10-CM | POA: Diagnosis not present

## 2023-05-26 DIAGNOSIS — Z1331 Encounter for screening for depression: Secondary | ICD-10-CM | POA: Diagnosis not present

## 2023-05-26 DIAGNOSIS — Z8673 Personal history of transient ischemic attack (TIA), and cerebral infarction without residual deficits: Secondary | ICD-10-CM | POA: Diagnosis not present

## 2023-05-26 DIAGNOSIS — M810 Age-related osteoporosis without current pathological fracture: Secondary | ICD-10-CM | POA: Diagnosis not present

## 2023-05-26 DIAGNOSIS — F330 Major depressive disorder, recurrent, mild: Secondary | ICD-10-CM | POA: Diagnosis not present

## 2023-05-26 DIAGNOSIS — E119 Type 2 diabetes mellitus without complications: Secondary | ICD-10-CM | POA: Diagnosis not present

## 2023-05-26 DIAGNOSIS — Z0000 Encounter for general adult medical examination without abnormal findings: Secondary | ICD-10-CM | POA: Diagnosis not present

## 2023-06-27 DIAGNOSIS — E1165 Type 2 diabetes mellitus with hyperglycemia: Secondary | ICD-10-CM | POA: Diagnosis not present

## 2023-06-27 DIAGNOSIS — E1140 Type 2 diabetes mellitus with diabetic neuropathy, unspecified: Secondary | ICD-10-CM | POA: Diagnosis not present

## 2023-06-27 DIAGNOSIS — I10 Essential (primary) hypertension: Secondary | ICD-10-CM | POA: Diagnosis not present

## 2023-06-27 DIAGNOSIS — E1169 Type 2 diabetes mellitus with other specified complication: Secondary | ICD-10-CM | POA: Diagnosis not present

## 2023-06-27 DIAGNOSIS — Z794 Long term (current) use of insulin: Secondary | ICD-10-CM | POA: Diagnosis not present

## 2023-06-27 DIAGNOSIS — N184 Chronic kidney disease, stage 4 (severe): Secondary | ICD-10-CM | POA: Diagnosis not present

## 2023-06-27 DIAGNOSIS — M810 Age-related osteoporosis without current pathological fracture: Secondary | ICD-10-CM | POA: Diagnosis not present

## 2023-07-28 DIAGNOSIS — I10 Essential (primary) hypertension: Secondary | ICD-10-CM | POA: Diagnosis not present

## 2023-07-28 DIAGNOSIS — Z794 Long term (current) use of insulin: Secondary | ICD-10-CM | POA: Diagnosis not present

## 2023-07-28 DIAGNOSIS — E1169 Type 2 diabetes mellitus with other specified complication: Secondary | ICD-10-CM | POA: Diagnosis not present

## 2023-07-28 DIAGNOSIS — M810 Age-related osteoporosis without current pathological fracture: Secondary | ICD-10-CM | POA: Diagnosis not present

## 2023-07-28 DIAGNOSIS — E785 Hyperlipidemia, unspecified: Secondary | ICD-10-CM | POA: Diagnosis not present

## 2023-07-31 DIAGNOSIS — E1122 Type 2 diabetes mellitus with diabetic chronic kidney disease: Secondary | ICD-10-CM | POA: Diagnosis not present

## 2023-07-31 DIAGNOSIS — Z794 Long term (current) use of insulin: Secondary | ICD-10-CM | POA: Diagnosis not present

## 2023-07-31 DIAGNOSIS — N184 Chronic kidney disease, stage 4 (severe): Secondary | ICD-10-CM | POA: Diagnosis not present

## 2023-07-31 DIAGNOSIS — E1140 Type 2 diabetes mellitus with diabetic neuropathy, unspecified: Secondary | ICD-10-CM | POA: Diagnosis not present

## 2023-07-31 DIAGNOSIS — E119 Type 2 diabetes mellitus without complications: Secondary | ICD-10-CM | POA: Diagnosis not present

## 2023-07-31 DIAGNOSIS — E1165 Type 2 diabetes mellitus with hyperglycemia: Secondary | ICD-10-CM | POA: Diagnosis not present

## 2023-07-31 DIAGNOSIS — E1169 Type 2 diabetes mellitus with other specified complication: Secondary | ICD-10-CM | POA: Diagnosis not present

## 2023-08-16 DIAGNOSIS — R809 Proteinuria, unspecified: Secondary | ICD-10-CM | POA: Diagnosis not present

## 2023-08-16 DIAGNOSIS — N2581 Secondary hyperparathyroidism of renal origin: Secondary | ICD-10-CM | POA: Diagnosis not present

## 2023-08-16 DIAGNOSIS — E1122 Type 2 diabetes mellitus with diabetic chronic kidney disease: Secondary | ICD-10-CM | POA: Diagnosis not present

## 2023-08-16 DIAGNOSIS — D631 Anemia in chronic kidney disease: Secondary | ICD-10-CM | POA: Diagnosis not present

## 2023-08-16 DIAGNOSIS — E785 Hyperlipidemia, unspecified: Secondary | ICD-10-CM | POA: Diagnosis not present

## 2023-08-16 DIAGNOSIS — R609 Edema, unspecified: Secondary | ICD-10-CM | POA: Diagnosis not present

## 2023-08-16 DIAGNOSIS — I639 Cerebral infarction, unspecified: Secondary | ICD-10-CM | POA: Diagnosis not present

## 2023-08-16 DIAGNOSIS — N184 Chronic kidney disease, stage 4 (severe): Secondary | ICD-10-CM | POA: Diagnosis not present

## 2023-08-16 DIAGNOSIS — I10 Essential (primary) hypertension: Secondary | ICD-10-CM | POA: Diagnosis not present

## 2023-08-16 DIAGNOSIS — N393 Stress incontinence (female) (male): Secondary | ICD-10-CM | POA: Diagnosis not present

## 2023-08-17 ENCOUNTER — Other Ambulatory Visit: Payer: Self-pay | Admitting: Nephrology

## 2023-08-17 DIAGNOSIS — N184 Chronic kidney disease, stage 4 (severe): Secondary | ICD-10-CM

## 2023-08-17 DIAGNOSIS — D631 Anemia in chronic kidney disease: Secondary | ICD-10-CM

## 2023-08-17 DIAGNOSIS — R809 Proteinuria, unspecified: Secondary | ICD-10-CM

## 2023-09-26 DIAGNOSIS — Z794 Long term (current) use of insulin: Secondary | ICD-10-CM | POA: Diagnosis not present

## 2023-09-26 DIAGNOSIS — E1140 Type 2 diabetes mellitus with diabetic neuropathy, unspecified: Secondary | ICD-10-CM | POA: Diagnosis not present

## 2023-09-26 DIAGNOSIS — I10 Essential (primary) hypertension: Secondary | ICD-10-CM | POA: Diagnosis not present

## 2023-09-26 DIAGNOSIS — E1122 Type 2 diabetes mellitus with diabetic chronic kidney disease: Secondary | ICD-10-CM | POA: Diagnosis not present

## 2023-09-26 DIAGNOSIS — F330 Major depressive disorder, recurrent, mild: Secondary | ICD-10-CM | POA: Diagnosis not present

## 2023-09-26 DIAGNOSIS — I129 Hypertensive chronic kidney disease with stage 1 through stage 4 chronic kidney disease, or unspecified chronic kidney disease: Secondary | ICD-10-CM | POA: Diagnosis not present

## 2023-09-26 DIAGNOSIS — Z8673 Personal history of transient ischemic attack (TIA), and cerebral infarction without residual deficits: Secondary | ICD-10-CM | POA: Diagnosis not present

## 2023-09-26 DIAGNOSIS — E1169 Type 2 diabetes mellitus with other specified complication: Secondary | ICD-10-CM | POA: Diagnosis not present

## 2023-09-26 DIAGNOSIS — N184 Chronic kidney disease, stage 4 (severe): Secondary | ICD-10-CM | POA: Diagnosis not present

## 2023-09-26 DIAGNOSIS — E785 Hyperlipidemia, unspecified: Secondary | ICD-10-CM | POA: Diagnosis not present

## 2023-10-10 DIAGNOSIS — E1122 Type 2 diabetes mellitus with diabetic chronic kidney disease: Secondary | ICD-10-CM | POA: Diagnosis not present

## 2023-10-10 DIAGNOSIS — N184 Chronic kidney disease, stage 4 (severe): Secondary | ICD-10-CM | POA: Diagnosis not present

## 2023-10-10 DIAGNOSIS — E119 Type 2 diabetes mellitus without complications: Secondary | ICD-10-CM | POA: Diagnosis not present

## 2023-10-10 DIAGNOSIS — E1165 Type 2 diabetes mellitus with hyperglycemia: Secondary | ICD-10-CM | POA: Diagnosis not present

## 2023-10-10 DIAGNOSIS — E1140 Type 2 diabetes mellitus with diabetic neuropathy, unspecified: Secondary | ICD-10-CM | POA: Diagnosis not present

## 2023-10-10 DIAGNOSIS — Z794 Long term (current) use of insulin: Secondary | ICD-10-CM | POA: Diagnosis not present

## 2023-10-10 DIAGNOSIS — I10 Essential (primary) hypertension: Secondary | ICD-10-CM | POA: Diagnosis not present

## 2023-10-10 DIAGNOSIS — I129 Hypertensive chronic kidney disease with stage 1 through stage 4 chronic kidney disease, or unspecified chronic kidney disease: Secondary | ICD-10-CM | POA: Diagnosis not present

## 2023-12-04 DIAGNOSIS — A499 Bacterial infection, unspecified: Secondary | ICD-10-CM | POA: Diagnosis not present

## 2023-12-04 DIAGNOSIS — R053 Chronic cough: Secondary | ICD-10-CM | POA: Diagnosis not present

## 2023-12-04 DIAGNOSIS — R5383 Other fatigue: Secondary | ICD-10-CM | POA: Diagnosis not present

## 2023-12-04 DIAGNOSIS — S0993XA Unspecified injury of face, initial encounter: Secondary | ICD-10-CM | POA: Diagnosis not present

## 2023-12-04 DIAGNOSIS — N390 Urinary tract infection, site not specified: Secondary | ICD-10-CM | POA: Diagnosis not present

## 2023-12-04 DIAGNOSIS — R5381 Other malaise: Secondary | ICD-10-CM | POA: Diagnosis not present

## 2023-12-04 DIAGNOSIS — B9689 Other specified bacterial agents as the cause of diseases classified elsewhere: Secondary | ICD-10-CM | POA: Diagnosis not present

## 2023-12-04 DIAGNOSIS — J019 Acute sinusitis, unspecified: Secondary | ICD-10-CM | POA: Diagnosis not present

## 2023-12-04 DIAGNOSIS — N2889 Other specified disorders of kidney and ureter: Secondary | ICD-10-CM | POA: Diagnosis not present

## 2023-12-04 DIAGNOSIS — Z9181 History of falling: Secondary | ICD-10-CM | POA: Diagnosis not present
